# Patient Record
Sex: Female | Born: 1970
Health system: Southern US, Community
[De-identification: ages and names within clinical notes are randomized; demographics above are authoritative.]

## PROBLEM LIST (undated history)

## (undated) DIAGNOSIS — R7303 Prediabetes: Secondary | ICD-10-CM

## (undated) DIAGNOSIS — C539 Malignant neoplasm of cervix uteri, unspecified: Secondary | ICD-10-CM

## (undated) DIAGNOSIS — E739 Lactose intolerance, unspecified: Secondary | ICD-10-CM

## (undated) DIAGNOSIS — E7800 Pure hypercholesterolemia, unspecified: Secondary | ICD-10-CM

## (undated) DIAGNOSIS — M549 Dorsalgia, unspecified: Secondary | ICD-10-CM

## (undated) DIAGNOSIS — R87629 Unspecified abnormal cytological findings in specimens from vagina: Secondary | ICD-10-CM

## (undated) DIAGNOSIS — F32A Depression, unspecified: Secondary | ICD-10-CM

## (undated) DIAGNOSIS — F329 Major depressive disorder, single episode, unspecified: Secondary | ICD-10-CM

## (undated) DIAGNOSIS — I10 Essential (primary) hypertension: Secondary | ICD-10-CM

## (undated) DIAGNOSIS — IMO0002 Reserved for concepts with insufficient information to code with codable children: Secondary | ICD-10-CM

## (undated) DIAGNOSIS — M7730 Calcaneal spur, unspecified foot: Secondary | ICD-10-CM

## (undated) DIAGNOSIS — D649 Anemia, unspecified: Secondary | ICD-10-CM

## (undated) DIAGNOSIS — F419 Anxiety disorder, unspecified: Secondary | ICD-10-CM

## (undated) DIAGNOSIS — M2550 Pain in unspecified joint: Secondary | ICD-10-CM

## (undated) DIAGNOSIS — Z800 Family history of malignant neoplasm of digestive organs: Secondary | ICD-10-CM

## (undated) DIAGNOSIS — E669 Obesity, unspecified: Secondary | ICD-10-CM

## (undated) DIAGNOSIS — J45909 Unspecified asthma, uncomplicated: Secondary | ICD-10-CM

## (undated) DIAGNOSIS — G4730 Sleep apnea, unspecified: Secondary | ICD-10-CM

## (undated) DIAGNOSIS — M7710 Lateral epicondylitis, unspecified elbow: Secondary | ICD-10-CM

## (undated) HISTORY — DX: Reserved for concepts with insufficient information to code with codable children: IMO0002

## (undated) HISTORY — DX: Anemia, unspecified: D64.9

## (undated) HISTORY — PX: HERNIA REPAIR: SHX51

## (undated) HISTORY — DX: Major depressive disorder, single episode, unspecified: F32.9

## (undated) HISTORY — DX: Malignant neoplasm of cervix uteri, unspecified: C53.9

## (undated) HISTORY — PX: WISDOM TOOTH EXTRACTION: SHX21

## (undated) HISTORY — DX: Pain in unspecified joint: M25.50

## (undated) HISTORY — DX: Family history of malignant neoplasm of digestive organs: Z80.0

## (undated) HISTORY — DX: Pure hypercholesterolemia, unspecified: E78.00

## (undated) HISTORY — DX: Calcaneal spur, unspecified foot: M77.30

## (undated) HISTORY — PX: COLPOSCOPY: SHX161

## (undated) HISTORY — DX: Dorsalgia, unspecified: M54.9

## (undated) HISTORY — DX: Lateral epicondylitis, unspecified elbow: M77.10

## (undated) HISTORY — PX: OTHER SURGICAL HISTORY: SHX169

## (undated) HISTORY — DX: Unspecified abnormal cytological findings in specimens from vagina: R87.629

## (undated) HISTORY — DX: Obesity, unspecified: E66.9

## (undated) HISTORY — PX: TONSILLECTOMY AND ADENOIDECTOMY: SHX28

## (undated) HISTORY — DX: Depression, unspecified: F32.A

## (undated) HISTORY — DX: Sleep apnea, unspecified: G47.30

## (undated) HISTORY — DX: Lactose intolerance, unspecified: E73.9

## (undated) HISTORY — PX: TONSILLECTOMY: SUR1361

---

## 2002-03-28 ENCOUNTER — Encounter: Payer: Self-pay | Admitting: Family Medicine

## 2002-03-28 ENCOUNTER — Encounter: Admission: RE | Admit: 2002-03-28 | Discharge: 2002-03-28 | Payer: Self-pay | Admitting: Family Medicine

## 2003-05-23 ENCOUNTER — Inpatient Hospital Stay (HOSPITAL_COMMUNITY): Admission: AD | Admit: 2003-05-23 | Discharge: 2003-05-23 | Payer: Self-pay | Admitting: Obstetrics and Gynecology

## 2003-06-04 ENCOUNTER — Encounter: Admission: RE | Admit: 2003-06-04 | Discharge: 2003-06-04 | Payer: Self-pay | Admitting: Family Medicine

## 2003-06-04 ENCOUNTER — Encounter: Payer: Self-pay | Admitting: Family Medicine

## 2003-08-26 ENCOUNTER — Ambulatory Visit (HOSPITAL_BASED_OUTPATIENT_CLINIC_OR_DEPARTMENT_OTHER): Admission: RE | Admit: 2003-08-26 | Discharge: 2003-08-26 | Payer: Self-pay | Admitting: Otolaryngology

## 2003-10-06 ENCOUNTER — Ambulatory Visit (HOSPITAL_COMMUNITY): Admission: RE | Admit: 2003-10-06 | Discharge: 2003-10-07 | Payer: Self-pay | Admitting: Otolaryngology

## 2009-04-14 DIAGNOSIS — IMO0002 Reserved for concepts with insufficient information to code with codable children: Secondary | ICD-10-CM

## 2009-04-14 HISTORY — DX: Reserved for concepts with insufficient information to code with codable children: IMO0002

## 2010-12-03 ENCOUNTER — Encounter: Payer: Self-pay | Admitting: Obstetrics & Gynecology

## 2011-03-28 ENCOUNTER — Inpatient Hospital Stay (INDEPENDENT_AMBULATORY_CARE_PROVIDER_SITE_OTHER)
Admission: RE | Admit: 2011-03-28 | Discharge: 2011-03-28 | Disposition: A | Discharge status: Home / Self Care | Payer: Self-pay | Source: Ambulatory Visit | Attending: Family Medicine | Admitting: Family Medicine

## 2011-03-28 DIAGNOSIS — R059 Cough, unspecified: Secondary | ICD-10-CM

## 2011-03-28 DIAGNOSIS — J019 Acute sinusitis, unspecified: Secondary | ICD-10-CM

## 2011-03-28 DIAGNOSIS — R05 Cough: Secondary | ICD-10-CM

## 2011-08-06 ENCOUNTER — Emergency Department (HOSPITAL_COMMUNITY): Payer: Self-pay

## 2011-08-06 ENCOUNTER — Emergency Department (HOSPITAL_COMMUNITY)
Admission: EM | Admit: 2011-08-06 | Discharge: 2011-08-06 | Disposition: A | Discharge status: Home / Self Care | Payer: Self-pay | Attending: Emergency Medicine | Admitting: Emergency Medicine

## 2011-08-06 DIAGNOSIS — R51 Headache: Secondary | ICD-10-CM | POA: Insufficient documentation

## 2011-08-06 DIAGNOSIS — I10 Essential (primary) hypertension: Secondary | ICD-10-CM | POA: Insufficient documentation

## 2011-08-06 DIAGNOSIS — R42 Dizziness and giddiness: Secondary | ICD-10-CM | POA: Insufficient documentation

## 2011-08-06 DIAGNOSIS — R110 Nausea: Secondary | ICD-10-CM | POA: Insufficient documentation

## 2011-08-06 LAB — GLUCOSE, CAPILLARY: Glucose-Capillary: 127 mg/dL — ABNORMAL HIGH (ref 70–99)

## 2011-08-06 LAB — URINALYSIS, ROUTINE W REFLEX MICROSCOPIC
Bilirubin Urine: NEGATIVE
Ketones, ur: NEGATIVE mg/dL
Leukocytes, UA: NEGATIVE
Nitrite: NEGATIVE
Protein, ur: NEGATIVE mg/dL

## 2011-08-06 LAB — POCT I-STAT, CHEM 8
Calcium, Ion: 1.22 mmol/L (ref 1.12–1.32)
Glucose, Bld: 128 mg/dL — ABNORMAL HIGH (ref 70–99)
HCT: 41 % (ref 36.0–46.0)
TCO2: 25 mmol/L (ref 0–100)

## 2011-08-09 ENCOUNTER — Emergency Department (HOSPITAL_COMMUNITY)
Admission: EM | Admit: 2011-08-09 | Discharge: 2011-08-09 | Disposition: A | Discharge status: Home / Self Care | Payer: Self-pay | Attending: Emergency Medicine | Admitting: Emergency Medicine

## 2011-08-09 ENCOUNTER — Emergency Department (HOSPITAL_COMMUNITY): Payer: Self-pay

## 2011-08-09 DIAGNOSIS — R110 Nausea: Secondary | ICD-10-CM | POA: Insufficient documentation

## 2011-08-09 DIAGNOSIS — I10 Essential (primary) hypertension: Secondary | ICD-10-CM | POA: Insufficient documentation

## 2011-08-09 DIAGNOSIS — R51 Headache: Secondary | ICD-10-CM | POA: Insufficient documentation

## 2011-08-09 DIAGNOSIS — R42 Dizziness and giddiness: Secondary | ICD-10-CM | POA: Insufficient documentation

## 2011-08-09 DIAGNOSIS — H53149 Visual discomfort, unspecified: Secondary | ICD-10-CM | POA: Insufficient documentation

## 2011-08-09 LAB — URINALYSIS, ROUTINE W REFLEX MICROSCOPIC
Glucose, UA: NEGATIVE mg/dL
Hgb urine dipstick: NEGATIVE
Ketones, ur: NEGATIVE mg/dL
Protein, ur: NEGATIVE mg/dL
pH: 5.5 (ref 5.0–8.0)

## 2011-08-09 LAB — POCT I-STAT, CHEM 8
BUN: 16 mg/dL (ref 6–23)
Chloride: 100 mEq/L (ref 96–112)
Creatinine, Ser: 1.1 mg/dL (ref 0.50–1.10)
Sodium: 137 mEq/L (ref 135–145)
TCO2: 26 mmol/L (ref 0–100)

## 2011-08-09 LAB — DIFFERENTIAL
Basophils Relative: 1 % (ref 0–1)
Lymphocytes Relative: 43 % (ref 12–46)
Lymphs Abs: 3.2 10*3/uL (ref 0.7–4.0)
Monocytes Absolute: 0.7 10*3/uL (ref 0.1–1.0)
Monocytes Relative: 9 % (ref 3–12)
Neutro Abs: 3.4 10*3/uL (ref 1.7–7.7)
Neutrophils Relative %: 46 % (ref 43–77)

## 2011-08-09 LAB — CBC
HCT: 37.1 % (ref 36.0–46.0)
Hemoglobin: 11.6 g/dL — ABNORMAL LOW (ref 12.0–15.0)
MCH: 26.4 pg (ref 26.0–34.0)
MCHC: 31.3 g/dL (ref 30.0–36.0)
MCV: 84.5 fL (ref 78.0–100.0)
RBC: 4.39 MIL/uL (ref 3.87–5.11)

## 2012-09-03 ENCOUNTER — Ambulatory Visit (INDEPENDENT_AMBULATORY_CARE_PROVIDER_SITE_OTHER): Payer: BC Managed Care – PPO | Admitting: Obstetrics and Gynecology

## 2012-09-03 ENCOUNTER — Encounter: Payer: Self-pay | Admitting: Obstetrics and Gynecology

## 2012-09-03 VITALS — BP 108/70 | HR 74 | Ht 62.0 in | Wt 240.0 lb

## 2012-09-03 DIAGNOSIS — Z124 Encounter for screening for malignant neoplasm of cervix: Secondary | ICD-10-CM

## 2012-09-03 DIAGNOSIS — IMO0001 Reserved for inherently not codable concepts without codable children: Secondary | ICD-10-CM

## 2012-09-03 DIAGNOSIS — R6889 Other general symptoms and signs: Secondary | ICD-10-CM

## 2012-09-03 DIAGNOSIS — N920 Excessive and frequent menstruation with regular cycle: Secondary | ICD-10-CM

## 2012-09-03 LAB — CBC WITH DIFFERENTIAL/PLATELET
Basophils Relative: 0 % (ref 0–1)
HCT: 33.8 % — ABNORMAL LOW (ref 36.0–46.0)
Hemoglobin: 11 g/dL — ABNORMAL LOW (ref 12.0–15.0)
Lymphocytes Relative: 36 % (ref 12–46)
MCHC: 32.5 g/dL (ref 30.0–36.0)
Monocytes Absolute: 0.6 10*3/uL (ref 0.1–1.0)
Monocytes Relative: 8 % (ref 3–12)
Neutro Abs: 4.2 10*3/uL (ref 1.7–7.7)
Neutrophils Relative %: 54 % (ref 43–77)
RBC: 4.04 MIL/uL (ref 3.87–5.11)
WBC: 7.9 10*3/uL (ref 4.0–10.5)

## 2012-09-03 LAB — PROLACTIN: Prolactin: 6.3 ng/mL

## 2012-09-03 LAB — TSH: TSH: 0.624 u[IU]/mL (ref 0.350–4.500)

## 2012-09-03 NOTE — Patient Instructions (Signed)
Contraception Choices  Contraception (birth control) is the use of any methods or devices to prevent pregnancy. Below are some methods to help avoid pregnancy.  HORMONAL METHODS   · Contraceptive implant. This is a thin, plastic tube containing progesterone hormone. It does not contain estrogen hormone. Your caregiver inserts the tube in the inner part of the upper arm. The tube can remain in place for up to 3 years. After 3 years, the implant must be removed. The implant prevents the ovaries from releasing an egg (ovulation), thickens the cervical mucus which prevents sperm from entering the uterus, and thins the lining of the inside of the uterus.  · Progesterone-only injections. These injections are given every 3 months by your caregiver to prevent pregnancy. This synthetic progesterone hormone stops the ovaries from releasing eggs. It also thickens cervical mucus and changes the uterine lining. This makes it harder for sperm to survive in the uterus.  · Birth control pills. These pills contain estrogen and progesterone hormone. They work by stopping the egg from forming in the ovary (ovulation). Birth control pills are prescribed by a caregiver. Birth control pills can also be used to treat heavy periods.  · Minipill. This type of birth control pill contains only the progesterone hormone. They are taken every day of each month and must be prescribed by your caregiver.  · Birth control patch. The patch contains hormones similar to those in birth control pills. It must be changed once a week and is prescribed by a caregiver.  · Vaginal ring. The ring contains hormones similar to those in birth control pills. It is left in the vagina for 3 weeks, removed for 1 week, and then a new one is put back in place. The patient must be comfortable inserting and removing the ring from the vagina. A caregiver's prescription is necessary.  · Emergency contraception. Emergency contraceptives prevent pregnancy after unprotected  sexual intercourse. This pill can be taken right after sex or up to 5 days after unprotected sex. It is most effective the sooner you take the pills after having sexual intercourse. Emergency contraceptive pills are available without a prescription. Check with your pharmacist. Do not use emergency contraception as your only form of birth control.  BARRIER METHODS   · Female condom. This is a thin sheath (latex or rubber) that is worn over the penis during sexual intercourse. It can be used with spermicide to increase effectiveness.  · Female condom. This is a soft, loose-fitting sheath that is put into the vagina before sexual intercourse.  · Diaphragm. This is a soft, latex, dome-shaped barrier that must be fitted by a caregiver. It is inserted into the vagina, along with a spermicidal jelly. It is inserted before intercourse. The diaphragm should be left in the vagina for 6 to 8 hours after intercourse.  · Cervical cap. This is a round, soft, latex or plastic cup that fits over the cervix and must be fitted by a caregiver. The cap can be left in place for up to 48 hours after intercourse.  · Sponge. This is a soft, circular piece of polyurethane foam. The sponge has spermicide in it. It is inserted into the vagina after wetting it and before sexual intercourse.  · Spermicides. These are chemicals that kill or block sperm from entering the cervix and uterus. They come in the form of creams, jellies, suppositories, foam, or tablets. They do not require a prescription. They are inserted into the vagina with an applicator before having sexual intercourse.   The process must be repeated every time you have sexual intercourse.  INTRAUTERINE CONTRACEPTION  · Intrauterine device (IUD). This is a T-shaped device that is put in a woman's uterus during a menstrual period to prevent pregnancy. There are 2 types:  · Copper IUD. This type of IUD is wrapped in copper wire and is placed inside the uterus. Copper makes the uterus and  fallopian tubes produce a fluid that kills sperm. It can stay in place for 10 years.  · Hormone IUD. This type of IUD contains the hormone progestin (synthetic progesterone). The hormone thickens the cervical mucus and prevents sperm from entering the uterus, and it also thins the uterine lining to prevent implantation of a fertilized egg. The hormone can weaken or kill the sperm that get into the uterus. It can stay in place for 5 years.  PERMANENT METHODS OF CONTRACEPTION  · Female tubal ligation. This is when the woman's fallopian tubes are surgically sealed, tied, or blocked to prevent the egg from traveling to the uterus.  · Female sterilization. This is when the female has the tubes that carry sperm tied off (vasectomy). This blocks sperm from entering the vagina during sexual intercourse. After the procedure, the man can still ejaculate fluid (semen).  NATURAL PLANNING METHODS  · Natural family planning. This is not having sexual intercourse or using a barrier method (condom, diaphragm, cervical cap) on days the woman could become pregnant.  · Calendar method. This is keeping track of the length of each menstrual cycle and identifying when you are fertile.  · Ovulation method. This is avoiding sexual intercourse during ovulation.  · Symptothermal method. This is avoiding sexual intercourse during ovulation, using a thermometer and ovulation symptoms.  · Post-ovulation method. This is timing sexual intercourse after you have ovulated.  Regardless of which type or method of contraception you choose, it is important that you use condoms to protect against the transmission of sexually transmitted diseases (STDs). Talk with your caregiver about which form of contraception is most appropriate for you.  Document Released: 10/30/2005 Document Revised: 01/22/2012 Document Reviewed: 03/08/2011  [employer]® Patient Information ©2013 [employer].

## 2012-09-03 NOTE — Progress Notes (Signed)
Last Pap: 04/14/09 WNL: No ASC-H Regular Periods:no, sometimes Contraception: condoms  Monthly Breast exam:no Tetanus<67yrs:yes Nl.Bladder Function:yes Daily BMs:yes Healthy Diet:yes Calcium:no Mammogram:yes Date of Mammogram: 2009 Exercise:yes Have often Exercise: twice per week  Seatbelt: yes Abuse at home: no Stressful work:yes Sigmoid-colonoscopy: about 3 years ago wnl per pt Bone Density: No PCP: [facility] Change in PMH: n/a Change in FMH:n/a BP 108/70  Pulse 74  Ht 5\' 2"  (1.575 m)  Wt 240 lb (108.863 kg)  BMI 43.90 kg/m2  LMP 08/24/2012 Pt with complaints:yes and 1.  Pt c/o heavy vaginal bleeding for one yr.  Her periods are q month for 5-7 days and she changes a pad q 45 minutes.  She denies any bleeding d/o. She does have a h/o of fibroids.  She no longer desires children Physical Examination: General appearance - alert, well appearing, and in no distress Mental status - normal mood, behavior, speech, dress, motor activity, and thought processes Neck - supple, no significant adenopathy,  thyroid exam: thyroid is normal in size without nodules or tenderness Chest - clear to auscultation, no wheezes, rales or rhonchi, symmetric air   Heart - normal rate and regular rhythm Abdomen - soft, nontender, nondistended, no masses or organomegaly Breasts - breasts appear normal, no suspicious masses, no skin or nipple changes or axillary nodes Pelvic - normal external genitalia, vulva, vagina, cervix, uterus and adnexa Back exam - full range of motion, no tenderness, palpable spasm or pain on motion Neurological - alert, oriented, normal speech, no focal findings or movement disorder noted Musculoskeletal - no joint tenderness, deformity or swelling Extremities - no edema, redness or tenderness in the calves or thighs Skin - normal coloration and turgor, no rashes, no suspicious skin lesions noted mennorhagia ASC H First degree relative with colon  cancer Pap sent yes Mammogram due will scheudle condoms used for contraception.  Pt had an iud but it fell out RT for shg/embx/us  Check cbc. tsh and prolactin

## 2012-09-04 ENCOUNTER — Telehealth: Payer: Self-pay

## 2012-09-04 DIAGNOSIS — Z139 Encounter for screening, unspecified: Secondary | ICD-10-CM

## 2012-09-04 NOTE — Telephone Encounter (Signed)
Message copied by Rolla Plate on Wed Sep 04, 2012  8:41 AM ------      Message from: Jaymes Graff      Created: Tue Sep 03, 2012 10:07 AM       Please schedule pt for screening mammogram

## 2012-09-05 ENCOUNTER — Telehealth: Payer: Self-pay | Admitting: Obstetrics and Gynecology

## 2012-09-05 LAB — PAP IG W/ RFLX HPV ASCU

## 2012-09-06 LAB — HUMAN PAPILLOMAVIRUS, HIGH RISK: HPV DNA High Risk: NOT DETECTED

## 2012-09-06 NOTE — Telephone Encounter (Signed)
Barbara I'm sending this msg back, please call Niccole about this.

## 2012-09-17 ENCOUNTER — Telehealth: Payer: Self-pay

## 2012-09-17 NOTE — Telephone Encounter (Signed)
Lm on vm tcb rgd labs 

## 2012-09-17 NOTE — Telephone Encounter (Signed)
Message copied by Rolla Plate on Tue Sep 17, 2012  3:36 PM ------      Message from: Jaymes Graff      Created: Mon Sep 16, 2012  8:58 PM       Please tell patient her pap results and that she can repeat her pap in one year.  Thank you

## 2012-09-17 NOTE — Telephone Encounter (Signed)
Spoke with pt informed lab results pt voice understanding 

## 2012-10-07 ENCOUNTER — Ambulatory Visit
Admission: RE | Admit: 2012-10-07 | Discharge: 2012-10-07 | Disposition: A | Discharge status: Home / Self Care | Payer: BC Managed Care – PPO | Source: Ambulatory Visit | Attending: Obstetrics and Gynecology | Admitting: Obstetrics and Gynecology

## 2012-10-07 DIAGNOSIS — Z139 Encounter for screening, unspecified: Secondary | ICD-10-CM

## 2012-10-15 ENCOUNTER — Encounter: Payer: Self-pay | Admitting: Obstetrics and Gynecology

## 2012-10-25 ENCOUNTER — Encounter: Payer: BC Managed Care – HMO | Admitting: Obstetrics and Gynecology

## 2012-10-25 ENCOUNTER — Other Ambulatory Visit: Payer: Self-pay

## 2012-10-25 DIAGNOSIS — N920 Excessive and frequent menstruation with regular cycle: Secondary | ICD-10-CM

## 2012-11-21 ENCOUNTER — Other Ambulatory Visit: Payer: Self-pay | Admitting: Obstetrics and Gynecology

## 2012-11-21 DIAGNOSIS — N920 Excessive and frequent menstruation with regular cycle: Secondary | ICD-10-CM

## 2012-11-23 ENCOUNTER — Encounter (HOSPITAL_BASED_OUTPATIENT_CLINIC_OR_DEPARTMENT_OTHER): Payer: Self-pay | Admitting: *Deleted

## 2012-11-23 ENCOUNTER — Emergency Department (HOSPITAL_BASED_OUTPATIENT_CLINIC_OR_DEPARTMENT_OTHER)
Admission: EM | Admit: 2012-11-23 | Discharge: 2012-11-23 | Disposition: A | Discharge status: Home / Self Care | Payer: BC Managed Care – PPO | Attending: Emergency Medicine | Admitting: Emergency Medicine

## 2012-11-23 DIAGNOSIS — F3289 Other specified depressive episodes: Secondary | ICD-10-CM | POA: Insufficient documentation

## 2012-11-23 DIAGNOSIS — R0789 Other chest pain: Secondary | ICD-10-CM | POA: Insufficient documentation

## 2012-11-23 DIAGNOSIS — T7840XA Allergy, unspecified, initial encounter: Secondary | ICD-10-CM

## 2012-11-23 DIAGNOSIS — Z79899 Other long term (current) drug therapy: Secondary | ICD-10-CM | POA: Insufficient documentation

## 2012-11-23 DIAGNOSIS — L2989 Other pruritus: Secondary | ICD-10-CM | POA: Insufficient documentation

## 2012-11-23 DIAGNOSIS — F329 Major depressive disorder, single episode, unspecified: Secondary | ICD-10-CM | POA: Insufficient documentation

## 2012-11-23 DIAGNOSIS — L298 Other pruritus: Secondary | ICD-10-CM | POA: Insufficient documentation

## 2012-11-23 DIAGNOSIS — Z862 Personal history of diseases of the blood and blood-forming organs and certain disorders involving the immune mechanism: Secondary | ICD-10-CM | POA: Insufficient documentation

## 2012-11-23 DIAGNOSIS — R21 Rash and other nonspecific skin eruption: Secondary | ICD-10-CM | POA: Insufficient documentation

## 2012-11-23 MED ORDER — PREDNISONE 20 MG PO TABS: 40.0000 mg | ORAL_TABLET | Freq: Every day | ORAL | Status: DC

## 2012-11-23 MED ORDER — PREDNISONE 20 MG PO TABS
40.0000 mg | ORAL_TABLET | Freq: Once | ORAL | Status: AC
  Administered 2012-11-23: 40 mg via ORAL
  Filled 2012-11-23: qty 2

## 2012-11-23 MED ORDER — DIPHENHYDRAMINE HCL 25 MG PO CAPS
50.0000 mg | ORAL_CAPSULE | Freq: Once | ORAL | Status: AC
  Administered 2012-11-23: 50 mg via ORAL
  Filled 2012-11-23: qty 1

## 2012-11-23 NOTE — Discharge Instructions (Signed)

## 2012-11-23 NOTE — ED Notes (Addendum)
Pt states she woke up this a.m. And face was itching. Later this evening, noticed her face was swelling. Some chest discomfort "like indigestion". Denies other s/s. Similar reaction to Ace Inhibitors, but has been on current med for 3 years. Denies exposure or ingestion of new substances.
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
## 2012-11-23 NOTE — ED Notes (Signed)
Patient states that her face and lips began itching and swelling this morning after eating a donut at a meeting. Denies any rash anywhere else and denies swelling of her throat.

## 2012-11-25 ENCOUNTER — Other Ambulatory Visit: Payer: Self-pay | Admitting: Obstetrics and Gynecology

## 2012-11-25 ENCOUNTER — Encounter: Payer: BC Managed Care – HMO | Admitting: Obstetrics and Gynecology

## 2012-11-25 ENCOUNTER — Ambulatory Visit (INDEPENDENT_AMBULATORY_CARE_PROVIDER_SITE_OTHER): Payer: BC Managed Care – HMO

## 2012-11-25 ENCOUNTER — Ambulatory Visit (INDEPENDENT_AMBULATORY_CARE_PROVIDER_SITE_OTHER): Payer: BC Managed Care – HMO | Admitting: Obstetrics and Gynecology

## 2012-11-25 DIAGNOSIS — N920 Excessive and frequent menstruation with regular cycle: Secondary | ICD-10-CM

## 2012-11-25 DIAGNOSIS — L918 Other hypertrophic disorders of the skin: Secondary | ICD-10-CM

## 2012-11-25 DIAGNOSIS — N840 Polyp of corpus uteri: Secondary | ICD-10-CM | POA: Insufficient documentation

## 2012-11-25 DIAGNOSIS — L909 Atrophic disorder of skin, unspecified: Secondary | ICD-10-CM

## 2012-11-25 DIAGNOSIS — R6889 Other general symptoms and signs: Secondary | ICD-10-CM

## 2012-11-25 DIAGNOSIS — IMO0001 Reserved for inherently not codable concepts without codable children: Secondary | ICD-10-CM

## 2012-11-25 NOTE — Addendum Note (Signed)
Addended by: Rolla Plate on: 11/25/2012 04:49 PM   Modules accepted: Orders

## 2012-11-25 NOTE — Progress Notes (Signed)
Pt without c/o SHG/EMBX:  The patient was consented for both procedures.  She was placed in dorsal lithotomy position and speculum placed in the vagina.  The cervix was cleansed with three betadine swabs.  The endometrial pipet was placed in the the endometrial cavity through the cervix.  The uterus did sound to 9cm.  The pipet was removed and specimen was sent to pathology.  The sonohysterography catheter was then placed through the cervix and vaginal probe placed back in the vagina.Pt also with a skin tag just on the inside of left labium majorum.  Betadine applied.  1 cc 1% lidocaine infiltrated in skin.  Tag removed with the knife and sent to pathology.  silvernitrate used for hemostasis Menorrhagia SHG sig for polyp All treatments reviewed with the pt.  She desires polypectomy and endometrial ablation Pt desirs BTL discussed in detail' ASCUS neg HRHPV.  Repeat pap in one year

## 2012-11-25 NOTE — Patient Instructions (Signed)

## 2012-11-28 ENCOUNTER — Telehealth: Payer: Self-pay

## 2012-11-28 NOTE — Telephone Encounter (Signed)
Message copied by Rolla Plate on Thu Nov 28, 2012 11:58 AM ------      Message from: Jaymes Graff      Created: Wed Nov 27, 2012  5:19 PM       Please call the patient and let her know her endometrial biopsy is normal.  Tell her her vulvar biopsy was also normal.

## 2012-11-28 NOTE — Telephone Encounter (Signed)
Spoke with pt rgd labs informed endo biopsy and vulvar biopsy wnl pt voice understanding

## 2012-12-10 ENCOUNTER — Encounter (HOSPITAL_COMMUNITY): Payer: Self-pay | Admitting: Pharmacist

## 2012-12-10 ENCOUNTER — Other Ambulatory Visit: Payer: Self-pay | Admitting: Obstetrics and Gynecology

## 2012-12-10 ENCOUNTER — Telehealth: Payer: Self-pay | Admitting: Obstetrics and Gynecology

## 2012-12-10 NOTE — Telephone Encounter (Signed)
D&C Hysteroscopy; Endometrial Ablation and L/S BTL scheduled for 12/13/12 at 9:30 with ND.  BCBS effective 06/13/12.  Plan pays 90/10 after a $250 deductible.  Pre-op due $80.97. -Adrianne Pridgen

## 2012-12-12 ENCOUNTER — Encounter (HOSPITAL_COMMUNITY): Payer: Self-pay

## 2012-12-12 ENCOUNTER — Encounter (HOSPITAL_COMMUNITY)
Admission: RE | Admit: 2012-12-12 | Discharge: 2012-12-12 | Disposition: A | Discharge status: Home / Self Care | Payer: BC Managed Care – PPO | Source: Ambulatory Visit | Attending: Obstetrics and Gynecology | Admitting: Obstetrics and Gynecology

## 2012-12-12 HISTORY — DX: Essential (primary) hypertension: I10

## 2012-12-12 LAB — CBC
HCT: 35.1 % — ABNORMAL LOW (ref 36.0–46.0)
MCV: 87.5 fL (ref 78.0–100.0)
RBC: 4.01 MIL/uL (ref 3.87–5.11)
WBC: 8.3 10*3/uL (ref 4.0–10.5)

## 2012-12-12 LAB — BASIC METABOLIC PANEL
CO2: 32 mEq/L (ref 19–32)
Chloride: 100 mEq/L (ref 96–112)
Potassium: 3.5 mEq/L (ref 3.5–5.1)
Sodium: 142 mEq/L (ref 135–145)
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
## 2012-12-12 NOTE — Patient Instructions (Addendum)
   Your procedure is scheduled on: Friday, Jan 31  Enter through the Main Entrance of [facility] at: 8 am Pick up the phone at the desk and dial [phone] and inform us of your arrival.  Please call this number if you have any problems the morning of surgery: [phone]  Remember: Do not eat food after midnight: Thursday Do not drink clear liquids after: midnight Thursday Take these medicines the morning of surgery with a SIP OF WATER:  BP med  Do not wear jewelry, make-up, or FINGER nail polish No metal in your hair or on your body. Do not wear lotions, powders, perfumes. You may wear deodorant.  Please use your CHG wash as directed prior to surgery.  Do not shave anywhere for at least 12 hours prior to first CHG shower.  Do not bring valuables to the hospital. Contacts, dentures or bridgework may not be worn into surgery.  Patients discharged on the day of surgery will not be allowed to drive home.  Home with cousin Toribio Harbour.

## 2012-12-13 ENCOUNTER — Encounter (HOSPITAL_COMMUNITY)
Admission: RE | Disposition: A | Discharge status: Home / Self Care | Payer: Self-pay | Source: Ambulatory Visit | Attending: Obstetrics and Gynecology

## 2012-12-13 ENCOUNTER — Encounter (HOSPITAL_COMMUNITY): Payer: Self-pay | Admitting: *Deleted

## 2012-12-13 ENCOUNTER — Ambulatory Visit (HOSPITAL_COMMUNITY)
Admission: RE | Admit: 2012-12-13 | Discharge: 2012-12-13 | Disposition: A | Discharge status: Home / Self Care | Payer: BC Managed Care – PPO | Source: Ambulatory Visit | Attending: Obstetrics and Gynecology | Admitting: Obstetrics and Gynecology

## 2012-12-13 ENCOUNTER — Ambulatory Visit (HOSPITAL_COMMUNITY): Payer: BC Managed Care – PPO | Admitting: Anesthesiology

## 2012-12-13 ENCOUNTER — Encounter (HOSPITAL_COMMUNITY): Payer: Self-pay | Admitting: Anesthesiology

## 2012-12-13 DIAGNOSIS — Z01818 Encounter for other preprocedural examination: Secondary | ICD-10-CM | POA: Insufficient documentation

## 2012-12-13 DIAGNOSIS — Z01812 Encounter for preprocedural laboratory examination: Secondary | ICD-10-CM | POA: Insufficient documentation

## 2012-12-13 DIAGNOSIS — Z302 Encounter for sterilization: Secondary | ICD-10-CM | POA: Insufficient documentation

## 2012-12-13 DIAGNOSIS — N920 Excessive and frequent menstruation with regular cycle: Secondary | ICD-10-CM | POA: Insufficient documentation

## 2012-12-13 DIAGNOSIS — N840 Polyp of corpus uteri: Secondary | ICD-10-CM

## 2012-12-13 HISTORY — PX: LAPAROSCOPIC TUBAL LIGATION: SHX1937

## 2012-12-13 HISTORY — PX: DILITATION & CURRETTAGE/HYSTROSCOPY WITH THERMACHOICE ABLATION: SHX5569

## 2012-12-13 LAB — PREGNANCY, URINE: Preg Test, Ur: NEGATIVE

## 2012-12-13 SURGERY — LIGATION, FALLOPIAN TUBE, LAPAROSCOPIC
Anesthesia: General | Site: Uterus | Wound class: Clean Contaminated

## 2012-12-13 MED ORDER — LACTATED RINGERS IV SOLN
INTRAVENOUS | Status: DC
  Administered 2012-12-13: 50 mL/h via INTRAVENOUS
  Administered 2012-12-13: 10:00:00 via INTRAVENOUS
  Administered 2012-12-13: 50 mL/h via INTRAVENOUS

## 2012-12-13 MED ORDER — ONDANSETRON HCL 4 MG/2ML IJ SOLN
INTRAMUSCULAR | Status: AC
  Filled 2012-12-13: qty 2

## 2012-12-13 MED ORDER — KETOROLAC TROMETHAMINE 30 MG/ML IJ SOLN
30.0000 mg | Freq: Once | INTRAMUSCULAR | Status: AC
  Administered 2012-12-13: 30 mg via INTRAVENOUS

## 2012-12-13 MED ORDER — NEOSTIGMINE METHYLSULFATE 1 MG/ML IJ SOLN
INTRAMUSCULAR | Status: DC | PRN
  Administered 2012-12-13: 3 mg via INTRAVENOUS

## 2012-12-13 MED ORDER — FENTANYL CITRATE 0.05 MG/ML IJ SOLN
INTRAMUSCULAR | Status: AC
  Administered 2012-12-13: 50 ug via INTRAVENOUS
  Filled 2012-12-13: qty 2

## 2012-12-13 MED ORDER — FENTANYL CITRATE 0.05 MG/ML IJ SOLN
INTRAMUSCULAR | Status: AC
  Filled 2012-12-13: qty 5

## 2012-12-13 MED ORDER — MEPERIDINE HCL 25 MG/ML IJ SOLN: 6.2500 mg | INTRAMUSCULAR | Status: DC | PRN

## 2012-12-13 MED ORDER — SILVER NITRATE-POT NITRATE 75-25 % EX MISC
CUTANEOUS | Status: AC
  Filled 2012-12-13: qty 1

## 2012-12-13 MED ORDER — BUPIVACAINE HCL (PF) 0.25 % IJ SOLN
INTRAMUSCULAR | Status: DC | PRN
  Administered 2012-12-13: 19 mL

## 2012-12-13 MED ORDER — LIDOCAINE HCL (CARDIAC) 20 MG/ML IV SOLN
INTRAVENOUS | Status: DC | PRN
  Administered 2012-12-13: 10 mg via INTRAVENOUS
  Administered 2012-12-13: 80 mg via INTRAVENOUS

## 2012-12-13 MED ORDER — PROPOFOL 10 MG/ML IV EMUL
INTRAVENOUS | Status: AC
  Filled 2012-12-13: qty 20

## 2012-12-13 MED ORDER — ROCURONIUM BROMIDE 100 MG/10ML IV SOLN
INTRAVENOUS | Status: DC | PRN
  Administered 2012-12-13: 10 mg via INTRAVENOUS
  Administered 2012-12-13: 5 mg via INTRAVENOUS
  Administered 2012-12-13: 35 mg via INTRAVENOUS

## 2012-12-13 MED ORDER — EPHEDRINE 5 MG/ML INJ
10.0000 mg | Freq: Once | INTRAVENOUS | Status: DC
  Filled 2012-12-13: qty 2

## 2012-12-13 MED ORDER — GLYCOPYRROLATE 0.2 MG/ML IJ SOLN
INTRAMUSCULAR | Status: AC
  Filled 2012-12-13: qty 3

## 2012-12-13 MED ORDER — LIDOCAINE HCL 2 % IJ SOLN
INTRAMUSCULAR | Status: DC | PRN
  Administered 2012-12-13: 20 mL

## 2012-12-13 MED ORDER — BUPIVACAINE HCL (PF) 0.25 % IJ SOLN
INTRAMUSCULAR | Status: AC
  Filled 2012-12-13: qty 30

## 2012-12-13 MED ORDER — DEXAMETHASONE SODIUM PHOSPHATE 10 MG/ML IJ SOLN
INTRAMUSCULAR | Status: DC | PRN
  Administered 2012-12-13: 10 mg via INTRAVENOUS

## 2012-12-13 MED ORDER — EPHEDRINE SULFATE 50 MG/ML IJ SOLN
INTRAMUSCULAR | Status: AC
  Administered 2012-12-13: 10 mg
  Filled 2012-12-13: qty 1

## 2012-12-13 MED ORDER — IBUPROFEN 600 MG PO TABS: 600.0000 mg | ORAL_TABLET | Freq: Four times a day (QID) | ORAL | Status: DC | PRN

## 2012-12-13 MED ORDER — FENTANYL CITRATE 0.05 MG/ML IJ SOLN
INTRAMUSCULAR | Status: DC | PRN
  Administered 2012-12-13: 100 ug via INTRAVENOUS
  Administered 2012-12-13: 50 ug via INTRAVENOUS
  Administered 2012-12-13: 100 ug via INTRAVENOUS

## 2012-12-13 MED ORDER — LIDOCAINE HCL (CARDIAC) 20 MG/ML IV SOLN
INTRAVENOUS | Status: AC
  Filled 2012-12-13: qty 5

## 2012-12-13 MED ORDER — KETOROLAC TROMETHAMINE 30 MG/ML IJ SOLN
INTRAMUSCULAR | Status: AC
  Administered 2012-12-13: 30 mg via INTRAVENOUS
  Filled 2012-12-13: qty 1

## 2012-12-13 MED ORDER — FENTANYL CITRATE 0.05 MG/ML IJ SOLN
25.0000 ug | INTRAMUSCULAR | Status: DC | PRN
  Administered 2012-12-13 (×2): 50 ug via INTRAVENOUS

## 2012-12-13 MED ORDER — HYDROCODONE-ACETAMINOPHEN 5-500 MG PO TABS: 1.0000 | ORAL_TABLET | Freq: Four times a day (QID) | ORAL | Status: DC | PRN

## 2012-12-13 MED ORDER — ACETAMINOPHEN 10 MG/ML IV SOLN
1000.0000 mg | Freq: Four times a day (QID) | INTRAVENOUS | Status: DC
  Administered 2012-12-13: 1000 mg via INTRAVENOUS
  Filled 2012-12-13: qty 100

## 2012-12-13 MED ORDER — ACETAMINOPHEN 10 MG/ML IV SOLN
INTRAVENOUS | Status: AC
  Administered 2012-12-13: 1000 mg via INTRAVENOUS
  Filled 2012-12-13: qty 100

## 2012-12-13 MED ORDER — DEXAMETHASONE SODIUM PHOSPHATE 10 MG/ML IJ SOLN
INTRAMUSCULAR | Status: AC
  Filled 2012-12-13: qty 1

## 2012-12-13 MED ORDER — PHENYLEPHRINE HCL 10 MG/ML IJ SOLN
INTRAMUSCULAR | Status: DC | PRN
  Administered 2012-12-13: 40 ug via INTRAVENOUS

## 2012-12-13 MED ORDER — METOCLOPRAMIDE HCL 5 MG/ML IJ SOLN
INTRAMUSCULAR | Status: AC
  Administered 2012-12-13: 10 mg via INTRAVENOUS
  Filled 2012-12-13: qty 2

## 2012-12-13 MED ORDER — GLYCOPYRROLATE 0.2 MG/ML IJ SOLN
INTRAMUSCULAR | Status: DC | PRN
  Administered 2012-12-13: 0.1 mg via INTRAVENOUS
  Administered 2012-12-13: .6 mg via INTRAVENOUS

## 2012-12-13 MED ORDER — ROCURONIUM BROMIDE 50 MG/5ML IV SOLN
INTRAVENOUS | Status: AC
  Filled 2012-12-13: qty 1

## 2012-12-13 MED ORDER — MIDAZOLAM HCL 5 MG/5ML IJ SOLN
INTRAMUSCULAR | Status: DC | PRN
  Administered 2012-12-13: 2 mg via INTRAVENOUS

## 2012-12-13 MED ORDER — METOCLOPRAMIDE HCL 5 MG/ML IJ SOLN
10.0000 mg | Freq: Once | INTRAMUSCULAR | Status: AC | PRN
  Administered 2012-12-13: 10 mg via INTRAVENOUS

## 2012-12-13 MED ORDER — MIDAZOLAM HCL 2 MG/2ML IJ SOLN
INTRAMUSCULAR | Status: AC
  Filled 2012-12-13: qty 2

## 2012-12-13 MED ORDER — ONDANSETRON HCL 4 MG/2ML IJ SOLN
INTRAMUSCULAR | Status: DC | PRN
  Administered 2012-12-13: 4 mg via INTRAVENOUS

## 2012-12-13 MED ORDER — NEOSTIGMINE METHYLSULFATE 1 MG/ML IJ SOLN
INTRAMUSCULAR | Status: AC
  Filled 2012-12-13: qty 1

## 2012-12-13 SURGICAL SUPPLY — 25 items
ABLATOR ENDOMETRIAL BIPOLAR (ABLATOR) ×3 IMPLANT
CANISTER SUCTION 2500CC (MISCELLANEOUS) ×3 IMPLANT
CATH FOLEY 2WAY SLVR  5CC 16FR (CATHETERS) ×1
CATH FOLEY 2WAY SLVR 5CC 16FR (CATHETERS) ×2 IMPLANT
CATH ROBINSON RED A/P 16FR (CATHETERS) ×3 IMPLANT
CATH THERMACHOICE III (CATHETERS) IMPLANT
CLOTH BEACON ORANGE TIMEOUT ST (SAFETY) ×3 IMPLANT
CONTAINER PREFILL 10% NBF 60ML (FORM) ×6 IMPLANT
DRESSING TELFA 8X3 (GAUZE/BANDAGES/DRESSINGS) ×3 IMPLANT
FORCEPS CUTTING 33CM 5MM (CUTTING FORCEPS) ×3 IMPLANT
GLOVE BIO SURGEON STRL SZ 6.5 (GLOVE) ×6 IMPLANT
GLOVE BIOGEL PI IND STRL 7.0 (GLOVE) ×2 IMPLANT
GLOVE BIOGEL PI INDICATOR 7.0 (GLOVE) ×1
GOWN PREVENTION PLUS LG XLONG (DISPOSABLE) ×6 IMPLANT
GOWN STRL REIN XL XLG (GOWN DISPOSABLE) ×6 IMPLANT
PACK HYSTEROSCOPY LF (CUSTOM PROCEDURE TRAY) ×3 IMPLANT
PACK LAPAROSCOPY BASIN (CUSTOM PROCEDURE TRAY) ×3 IMPLANT
PAD OB MATERNITY 4.3X12.25 (PERSONAL CARE ITEMS) ×3 IMPLANT
SUT MNCRL AB 3-0 PS2 27 (SUTURE) ×3 IMPLANT
SUT VICRYL 0 UR6 27IN ABS (SUTURE) ×3 IMPLANT
SYR 20CC LL (SYRINGE) ×3 IMPLANT
TOWEL OR 17X24 6PK STRL BLUE (TOWEL DISPOSABLE) ×6 IMPLANT
TROCAR BALLN 12MMX100 BLUNT (TROCAR) ×3 IMPLANT
TROCAR Z-THREAD FIOS 5X100MM (TROCAR) ×6 IMPLANT
WATER STERILE IRR 1000ML POUR (IV SOLUTION) ×3 IMPLANT

## 2012-12-13 NOTE — Op Note (Signed)
dications: The patient is a39 y.o. female with diagnosis of multiparity and menorrhagia.  Pre-operative Diagnosis: Multiparity desires sterilization and menorrhagia  Post-operative Diagnosis: same  Surgeon: NWGNFAO,ZHYQM A   Assistants: none  Anesthesia: General endotracheal anesthesia  ASA Class: 1 Procedure  D&C hysteroscopy.  novasure endometrial ablation.  L/S Bilateral salpingectomy  Procedure Details The patient was seen in the Holding Room. The risks, benefits, complications, treatment options, and expected outcomes were discussed with the patient. The possibilities of reaction to medication, pulmonary aspiration, perforation of viscus, bleeding, recurrent infection, the need for additional procedures, failure to diagnose a condition, and creating a complication requiring transfusion or operation were discussed with the patient. The patient concurred with the proposed plan, giving informed consent. The patient was taken to the Operating Room, identified as Jordan Bush and the procedure verified as Diagnostic Laparoscopy with BTL and Novasure. A Time Out was held and the above information confirmed.   The patient was taken to the operating room and placed in dorsal lithotomy position. She was prepped and draped in the normal sterile fashion. A bivalve speculum was placed into the vagina. Before the bivalve speculum was placed in out catheter was used to drain the bladder. The anterior lip of the cervix was grasped with a single-tooth tenaculum.  20 cc 2% xylocaine was used for cervical block. The endocervix was measured and measured 4.5 cm the uterine sound length was 9 cm. The hysteroscope was placed into the uterine cavity. There was fluffy endometrium.  No masses or polyps were seen. The settings were placed on the NovaSure device.  The NovaSure was placed without difficulty and seated. Endometrial width was 3.5. Marland Kitchen  The NovaSure was tested and passed.  The NovaSure took place for 2 minute  20 seconds. The NovaSure was removed from the uterus. The hysteroscope was replaced into the uterine cavity. The fundus had been entirely ablated.  There were no signs of perforation.  The tenaculum was then anchored to the [employer].   Attention was then turned to the abdomen.      A 2 cm infra- umbilical incision was then performed.and carried down to the fascia.  The fascia was then opened and extended bilaterally.  Peritoneum was then entered.  o vicryl was then placed around the fascia in a circumferential fashion.   The hasson was placed and ancored to the suture.The above findings were noted. Normal pelvic anatomy.  Uterus,tubes, ovaries and appendix apperared normal.    The  Posterior and anterior culdesac and liver appeared normal.   After marcaine was used a 5mm incisions were made in the right and left lower quadrants of the abdomen.   five mm trocars were placed under direct visualization.  The gyrus was used to remove both tubes.  Hemostasis was noted.  A 5 mm scope was used in the LLQ 5 mm port.  The tubes were removed with a grasper through the umbilical port.  Irrigation was done.  Hemostasis was noted. All trocars were removed under direct visualization.   Following the procedure the umbilical hasson was removed after intra-abdominal carbon dioxide was expressed. The fascia was reaproximated by tying the 0 vicryl suture.   The incision was closed with subcutaneous and subcuticular sutures of 3-0 monocryl. The 5mm skin incisions were repaired with dermabond.   The intrauterine manipulator was then removed.  Foley was placed after the ablation and removed after the case was done Instrument, sponge, and needle counts were correct prior to abdominal closure  and at the conclusion of the case.   Findings: See above Estimated Blood Loss:  Minimal         Drains: none         Total IV Fluids:         Specimens: EMC and bilateral tuves         Complications:  None; patient  tolerated the procedure well. 50 ccdeficit of hysteroscpic fluid         Disposition: PACU - hemodynamically stable.         Condition: stable

## 2012-12-13 NOTE — Transfer of Care (Signed)
Immediate Anesthesia Transfer of Care Note  Patient: Jordan Bush  Procedure(s) Performed: Procedure(s) (LRB) with comments: LAPAROSCOPIC TUBAL LIGATION (Bilateral) DILATATION & CURETTAGE/HYSTEROSCOPY WITH THERMACHOICE ABLATION (N/A)  Patient Location: PACU  Anesthesia Type:General  Level of Consciousness: awake, alert  and oriented  Airway & Oxygen Therapy: Patient Spontanous Breathing and Patient connected to nasal cannula oxygen  Post-op Assessment: Report given to PACU RN and Post -op Vital signs reviewed and stable  Post vital signs: Reviewed and stable  Complications: No apparent anesthesia complications

## 2012-12-13 NOTE — Anesthesia Procedure Notes (Addendum)
Procedure Name: Intubation Date/Time: 12/13/2012 9:45 AM Performed by: Graciela Husbands Pre-anesthesia Checklist: Suction available, Emergency Drugs available, Timeout performed, Patient identified and Patient being monitored Patient Re-evaluated:Patient Re-evaluated prior to inductionOxygen Delivery Method: Circle system utilized Preoxygenation: Pre-oxygenation with 100% oxygen Intubation Type: IV induction Ventilation: Mask ventilation without difficulty Laryngoscope Size: Mac and 4 Grade View: Grade II Tube size: 7.0 mm Number of attempts: 1 Airway Equipment and Method: Patient positioned with wedge pillow and Stylet Placement Confirmation: breath sounds checked- equal and bilateral,  positive ETCO2 and ETT inserted through vocal cords under direct vision Secured at: 21 cm Dental Injury: Teeth and Oropharynx as per pre-operative assessment  Difficulty Due To: Difficulty was anticipated

## 2012-12-13 NOTE — Anesthesia Preprocedure Evaluation (Signed)
Anesthesia Evaluation  Patient identified by MRN, date of birth, ID band Patient awake    Reviewed: Allergy & Precautions, H&P , NPO status , Patient's Chart, lab work & pertinent test results  Airway Mallampati: III TM Distance: >3 FB Neck ROM: full    Dental No notable dental hx. (+) Teeth Intact   Pulmonary neg pulmonary ROS,  breath sounds clear to auscultation  Pulmonary exam normal       Cardiovascular hypertension, On Medications and On Home Beta Blockers Rhythm:regular Rate:Normal     Neuro/Psych negative neurological ROS     GI/Hepatic   Endo/Other  Morbid obesity  Renal/GU negative Renal ROS  negative genitourinary   Musculoskeletal   Abdominal Normal abdominal exam  (+)   Peds  Hematology negative hematology ROS (+) anemia ,   Anesthesia Other Findings   Reproductive/Obstetrics Endometrial Polyp Desires Permanent Sterilization                           Anesthesia Physical Anesthesia Plan  ASA: III  Anesthesia Plan: General ETT   Post-op Pain Management:    Induction:   Airway Management Planned:   Additional Equipment:   Intra-op Plan:   Post-operative Plan:   Informed Consent: I have reviewed the patients History and Physical, chart, labs and discussed the procedure including the risks, benefits and alternatives for the proposed anesthesia with the patient or authorized representative who has indicated his/her understanding and acceptance.   Dental Advisory Given  Plan Discussed with: Anesthesiologist, CRNA and Surgeon  Anesthesia Plan Comments:         Anesthesia Quick Evaluation

## 2012-12-13 NOTE — Anesthesia Postprocedure Evaluation (Signed)
Anesthesia Post Note  Patient: Jordan Bush  Procedure(s) Performed: Procedure(s) (LRB): LAPAROSCOPIC TUBAL LIGATION (Bilateral) DILATATION & CURETTAGE/HYSTEROSCOPY WITH THERMACHOICE ABLATION (N/A)  Anesthesia type: General  Patient location: PACU  Post pain: Pain level controlled  Post assessment: Post-op Vital signs reviewed  Last Vitals:  Filed Vitals:   12/13/12 1145  BP: 101/46  Pulse: 69  Temp:   Resp: 16    Post vital signs: Reviewed  Level of consciousness: sedated  Complications: No apparent anesthesia complications

## 2012-12-13 NOTE — Interval H&P Note (Signed)
History and Physical Interval Note:  12/13/2012 9:43 AM  Jordan Bush  has presented today for surgery, with the diagnosis of Menorrhagia, Endometrial Polyp, Multiparity [id number] [phone]  The various methods of treatment have been discussed with the patient and family. After consideration of risks, benefits and other options for treatment, the patient has consented to  Procedure(s) (LRB) with comments: LAPAROSCOPIC TUBAL LIGATION (Bilateral) DILATATION & CURETTAGE/HYSTEROSCOPY WITH THERMACHOICE ABLATION (N/A) as a surgical intervention .  The patient's history has been reviewed, patient examined, no change in status, stable for surgery.  I have reviewed the patient's chart and labs.  Questions were answered to the patient's satisfaction.     [id number],YNWGN A  Date of Initial H&P: 12/13/12  History reviewed, patient examined, no change in status, stable for surgery.

## 2012-12-16 ENCOUNTER — Telehealth: Payer: Self-pay | Admitting: Obstetrics and Gynecology

## 2012-12-16 ENCOUNTER — Encounter (HOSPITAL_COMMUNITY): Payer: Self-pay | Admitting: Obstetrics and Gynecology

## 2012-12-16 NOTE — Telephone Encounter (Signed)
Spoke with pr rgd msg pt had hysteroscopy tubal done on 12/13/12 wants to return to work some time this week wants a return to work note advised pt will consult with nd and see if she need to come in sooner for post op in order to go back to work pt voice understanding

## 2012-12-16 NOTE — Telephone Encounter (Signed)
Lm on vm tcb rgd msg 

## 2012-12-16 NOTE — Telephone Encounter (Signed)
Spoke with pt rgd msg pt wants to know when she can return to work advised pt need post-op first then we can give back to work note pt wil call back to [employer] op

## 2012-12-27 ENCOUNTER — Ambulatory Visit: Payer: BC Managed Care – HMO | Admitting: Obstetrics and Gynecology

## 2012-12-27 ENCOUNTER — Other Ambulatory Visit: Payer: Self-pay

## 2012-12-27 ENCOUNTER — Encounter: Payer: Self-pay | Admitting: Obstetrics and Gynecology

## 2012-12-27 ENCOUNTER — Telehealth: Payer: Self-pay | Admitting: Obstetrics and Gynecology

## 2012-12-27 VITALS — BP 110/72 | Temp 99.5°F | Wt 221.0 lb

## 2012-12-27 DIAGNOSIS — Z9851 Tubal ligation status: Secondary | ICD-10-CM

## 2012-12-27 MED ORDER — DOXYCYCLINE HYCLATE 50 MG PO CAPS: 100.0000 mg | ORAL_CAPSULE | Freq: Two times a day (BID) | ORAL | Status: DC

## 2012-12-27 NOTE — Progress Notes (Signed)
DATE OF SURGERY: 12/13/12 TYPE OF SURGERY: BTL, Hysteroscopy D&C  PAIN:Yes back pain and cramping VAG BLEEDING: yes spotting today  VAG DISCHARGE: yes with odor  NORMAL GI FUNCTN: yes NORMAL GU FUNCTN: yes Pt states she thinks she is having allergic reaction to something.  Diagnosis 1. Endometrium, curettage - BENIGN PROLIFERATIVE ENDOMETRIUM, NO ATYPIA, HYPERPLASIA OR MALIGNANCY. 2. Fallopian tube, bilateral - BENIGN FALLOPIAN TUBAL TISSUE WITH PARATUBAL CYST, NO EVIDENCE OF ENDOMETRIOSIS, ATYPIA OR MALIGNANCY. BP 110/72  Temp(Src) 99.5 F (37.5 C) (Oral)  Wt 221 lb (100.245 kg)  BMI 40.41 kg/m2 Physical Examination: General appearance - alert, well appearing, and in no distress Mouth - mucous membranes moist, pharynx normal without lesions and swelling in the upper right lip Abdomen - soft, nontender, nondistended, no masses or organomegaly Incisions CDI Pelvic - normal external genitalia, vulva, vagina, cervix, uterus and adnexa, mild uterine tenderness   S/p D&C hysteroscopy and endometrial ablalation & L/S Pt stable.  Mild uterine tenderness.  Will dive doxycycline Explained vaginal discharge after ablation normal. Use rephresh prn GFR slightly low f/u with PCP RT work on Monday Pt with allergic rxn.  Keep food diary.  No change in soaps or detergents.  Use benadryl and f/u with pcp

## 2012-12-27 NOTE — Telephone Encounter (Signed)
ND to put order for medication in.   Darien Ramus, CMA

## 2012-12-27 NOTE — Addendum Note (Signed)
Addended by: Jaymes Graff on: 12/27/2012 02:50 PM   Modules accepted: Orders

## 2012-12-27 NOTE — Telephone Encounter (Signed)
CP to speak with ND regarding med.

## 2012-12-28 ENCOUNTER — Other Ambulatory Visit: Payer: Self-pay

## 2013-09-12 ENCOUNTER — Other Ambulatory Visit: Payer: Self-pay

## 2013-09-12 DIAGNOSIS — Z1231 Encounter for screening mammogram for malignant neoplasm of breast: Secondary | ICD-10-CM

## 2013-10-07 ENCOUNTER — Encounter: Payer: Self-pay | Admitting: Emergency Medicine

## 2013-10-07 ENCOUNTER — Emergency Department (INDEPENDENT_AMBULATORY_CARE_PROVIDER_SITE_OTHER)
Admission: EM | Admit: 2013-10-07 | Discharge: 2013-10-07 | Disposition: A | Discharge status: Home / Self Care | Payer: BC Managed Care – PPO | Source: Home / Self Care | Attending: Emergency Medicine | Admitting: Emergency Medicine

## 2013-10-07 DIAGNOSIS — J45990 Exercise induced bronchospasm: Secondary | ICD-10-CM

## 2013-10-07 DIAGNOSIS — J0100 Acute maxillary sinusitis, unspecified: Secondary | ICD-10-CM

## 2013-10-07 DIAGNOSIS — J309 Allergic rhinitis, unspecified: Secondary | ICD-10-CM

## 2013-10-07 MED ORDER — PREDNISONE (PAK) 10 MG PO TABS: ORAL_TABLET | ORAL | Status: DC

## 2013-10-07 MED ORDER — FLUCONAZOLE 150 MG PO TABS: 150.0000 mg | ORAL_TABLET | Freq: Once | ORAL | Status: DC

## 2013-10-07 MED ORDER — ALBUTEROL SULFATE HFA 108 (90 BASE) MCG/ACT IN AERS: 2.0000 | INHALATION_SPRAY | RESPIRATORY_TRACT | Status: DC | PRN

## 2013-10-07 MED ORDER — FLUTICASONE PROPIONATE 50 MCG/ACT NA SUSP: NASAL | Status: DC

## 2013-10-07 MED ORDER — AZITHROMYCIN 250 MG PO TABS: ORAL_TABLET | ORAL | Status: DC
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
## 2013-10-07 NOTE — ED Notes (Signed)
Jordan Bush reports running a 5K 3 days ago. Since, she c/o runny nose, congestion and sneezing. Cough in the Am that causes SOB at times. She has had a flu vaccine this year.

## 2013-11-03 ENCOUNTER — Ambulatory Visit: Payer: BC Managed Care – PPO

## 2013-11-27 ENCOUNTER — Ambulatory Visit
Admission: RE | Admit: 2013-11-27 | Discharge: 2013-11-27 | Disposition: A | Discharge status: Home / Self Care | Payer: BC Managed Care – PPO | Source: Ambulatory Visit

## 2013-11-27 DIAGNOSIS — Z1231 Encounter for screening mammogram for malignant neoplasm of breast: Secondary | ICD-10-CM

## 2013-12-19 ENCOUNTER — Emergency Department (INDEPENDENT_AMBULATORY_CARE_PROVIDER_SITE_OTHER)
Admission: EM | Admit: 2013-12-19 | Discharge: 2013-12-19 | Disposition: A | Discharge status: Home / Self Care | Payer: BC Managed Care – PPO | Source: Home / Self Care | Attending: Family Medicine | Admitting: Family Medicine

## 2013-12-19 ENCOUNTER — Encounter: Payer: Self-pay | Admitting: Emergency Medicine

## 2013-12-19 DIAGNOSIS — J069 Acute upper respiratory infection, unspecified: Secondary | ICD-10-CM

## 2013-12-19 MED ORDER — PREDNISONE 20 MG PO TABS
20.0000 mg | ORAL_TABLET | Freq: Two times a day (BID) | ORAL | Status: DC
Start: 2013-12-19 — End: 2014-10-04

## 2013-12-19 MED ORDER — ALBUTEROL SULFATE HFA 108 (90 BASE) MCG/ACT IN AERS: 2.0000 | INHALATION_SPRAY | RESPIRATORY_TRACT | Status: DC | PRN

## 2013-12-19 MED ORDER — AZITHROMYCIN 250 MG PO TABS: ORAL_TABLET | ORAL | Status: DC

## 2013-12-19 MED ORDER — BENZONATATE 200 MG PO CAPS: 200.0000 mg | ORAL_CAPSULE | Freq: Every day | ORAL | Status: DC

## 2013-12-19 NOTE — Discharge Instructions (Signed)
Take plain Mucinex (1200 mg guaifenesin) twice daily for cough and congestion.  Increase fluid intake, rest. May use Afrin nasal spray (or generic oxymetazoline) twice daily for about 5 days.  Also recommend using saline nasal spray several times daily and saline nasal irrigation (AYR is a common brand) Try warm salt water gargles for sore throat.  Continue albuterol inhaler as needed. Stop all antihistamines for now, and other non-prescription cough/cold preparations. Begin Azithromycin if not improving about one week or if persistent fever develops  Follow-up with family doctor if not improving about10 days.

## 2013-12-19 NOTE — ED Notes (Signed)
Jordan Bush reports awaking today with productive cough, congestion and HA. No fever. Rec'd flu vac this season.
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
## 2014-04-07 ENCOUNTER — Emergency Department (INDEPENDENT_AMBULATORY_CARE_PROVIDER_SITE_OTHER)
Admission: EM | Admit: 2014-04-07 | Discharge: 2014-04-07 | Disposition: A | Discharge status: Home / Self Care | Payer: BC Managed Care – PPO | Source: Home / Self Care

## 2014-04-07 ENCOUNTER — Encounter: Payer: Self-pay | Admitting: Emergency Medicine

## 2014-04-07 DIAGNOSIS — J069 Acute upper respiratory infection, unspecified: Secondary | ICD-10-CM

## 2014-04-07 DIAGNOSIS — J029 Acute pharyngitis, unspecified: Secondary | ICD-10-CM

## 2014-04-07 LAB — POCT RAPID STREP A (OFFICE): RAPID STREP A SCREEN: NEGATIVE

## 2014-04-07 MED ORDER — FLUCONAZOLE 150 MG PO TABS: 150.0000 mg | ORAL_TABLET | Freq: Once | ORAL | Status: DC

## 2014-04-07 MED ORDER — AMOXICILLIN-POT CLAVULANATE 875-125 MG PO TABS: 1.0000 | ORAL_TABLET | Freq: Two times a day (BID) | ORAL | Status: DC

## 2014-04-07 MED ORDER — BENZONATATE 200 MG PO CAPS: 200.0000 mg | ORAL_CAPSULE | Freq: Three times a day (TID) | ORAL | Status: DC | PRN
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
## 2014-04-07 NOTE — Discharge Instructions (Signed)
I would try warm salt water gargles.  Mucinex D twice a day.  Ibuprofen for pain/inflammation.  Pharyngitis Pharyngitis is redness, pain, and swelling (inflammation) of your pharynx.  CAUSES  Pharyngitis is usually caused by infection. Most of the time, these infections are from viruses (viral) and are part of a cold. However, sometimes pharyngitis is caused by bacteria (bacterial). Pharyngitis can also be caused by allergies. Viral pharyngitis may be spread from person to person by coughing, sneezing, and personal items or utensils (cups, forks, spoons, toothbrushes). Bacterial pharyngitis may be spread from person to person by more intimate contact, such as kissing.  SIGNS AND SYMPTOMS  Symptoms of pharyngitis include:   Sore throat.   Tiredness (fatigue).   Low-grade fever.   Headache.  Joint pain and muscle aches.  Skin rashes.  Swollen lymph nodes.  Plaque-like film on throat or tonsils (often seen with bacterial pharyngitis). DIAGNOSIS  Your health care provider will ask you questions about your illness and your symptoms. Your medical history, along with a physical exam, is often all that is needed to diagnose pharyngitis. Sometimes, a rapid strep test is done. Other lab tests may also be done, depending on the suspected cause.  TREATMENT  Viral pharyngitis will usually get better in 3 4 days without the use of medicine. Bacterial pharyngitis is treated with medicines that kill germs (antibiotics).  HOME CARE INSTRUCTIONS   Drink enough water and fluids to keep your urine clear or pale yellow.   Only take over-the-counter or prescription medicines as directed by your health care provider:   If you are prescribed antibiotics, make sure you finish them even if you start to feel better.   Do not take aspirin.   Get lots of rest.   Gargle with 8 oz of salt water ( tsp of salt per 1 qt of water) as often as every 1 2 hours to soothe your throat.   Throat  lozenges (if you are not at risk for choking) or sprays may be used to soothe your throat. SEEK MEDICAL CARE IF:   You have large, tender lumps in your neck.  You have a rash.  You cough up green, yellow-brown, or bloody spit. SEEK IMMEDIATE MEDICAL CARE IF:   Your neck becomes stiff.  You drool or are unable to swallow liquids.  You vomit or are unable to keep medicines or liquids down.  You have severe pain that does not go away with the use of recommended medicines.  You have trouble breathing (not caused by a stuffy nose). MAKE SURE YOU:   Understand these instructions.  Will watch your condition.  Will get help right away if you are not doing well or get worse. Document Released: 10/30/2005 Document Revised: 08/20/2013 Document Reviewed: 07/07/2013 [facility] Patient Information 2014 [place].    Upper Respiratory Infection, Adult An upper respiratory infection (URI) is also known as the common cold. It is often caused by a type of germ (virus). Colds are easily spread (contagious). You can pass it to others by kissing, coughing, sneezing, or drinking out of the same glass. Usually, you get better in 1 or 2 weeks.  HOME CARE   Only take medicine as told by your doctor.  Use a warm mist humidifier or breathe in steam from a hot shower.  Drink enough water and fluids to keep your pee (urine) clear or pale yellow.  Get plenty of rest.  Return to work when your temperature is back to normal or  as told by your doctor. You may use a face mask and wash your hands to stop your cold from spreading. GET HELP RIGHT AWAY IF:   After the first few days, you feel you are getting worse.  You have questions about your medicine.  You have chills, shortness of breath, or brown or red spit (mucus).  You have yellow or brown snot (nasal discharge) or pain in the face, especially when you bend forward.  You have a fever, puffy (swollen) neck, pain when you swallow, or white  spots in the back of your throat.  You have a bad headache, ear pain, sinus pain, or chest pain.  You have a high-pitched whistling sound when you breathe in and out (wheezing).  You have a lasting cough or cough up blood.  You have sore muscles or a stiff neck. MAKE SURE YOU:   Understand these instructions.  Will watch your condition.  Will get help right away if you are not doing well or get worse. Document Released: 04/17/2008 Document Revised: 01/22/2012 Document Reviewed: 03/06/2011 [facility] Patient Information 2014 [place], [place].

## 2014-04-07 NOTE — ED Notes (Signed)
Pt c/o cough, SOB with talking, chest congestion, and sore throat x 3 days. Denies fever.

## 2014-04-08 NOTE — ED Provider Notes (Signed)
Agree with exam, assessment, and plan.   Kandra Nicolas, MD 04/08/14 [phone]

## 2014-07-13 ENCOUNTER — Emergency Department (HOSPITAL_BASED_OUTPATIENT_CLINIC_OR_DEPARTMENT_OTHER)
Admission: EM | Admit: 2014-07-13 | Discharge: 2014-07-13 | Disposition: A | Discharge status: Home / Self Care | Payer: BC Managed Care – PPO | Attending: Emergency Medicine | Admitting: Emergency Medicine

## 2014-07-13 ENCOUNTER — Encounter (HOSPITAL_BASED_OUTPATIENT_CLINIC_OR_DEPARTMENT_OTHER): Payer: Self-pay | Admitting: Emergency Medicine

## 2014-07-13 ENCOUNTER — Emergency Department (HOSPITAL_BASED_OUTPATIENT_CLINIC_OR_DEPARTMENT_OTHER): Payer: BC Managed Care – PPO

## 2014-07-13 DIAGNOSIS — Z3202 Encounter for pregnancy test, result negative: Secondary | ICD-10-CM | POA: Insufficient documentation

## 2014-07-13 DIAGNOSIS — R079 Chest pain, unspecified: Secondary | ICD-10-CM | POA: Insufficient documentation

## 2014-07-13 DIAGNOSIS — I10 Essential (primary) hypertension: Secondary | ICD-10-CM | POA: Insufficient documentation

## 2014-07-13 DIAGNOSIS — Z79899 Other long term (current) drug therapy: Secondary | ICD-10-CM | POA: Insufficient documentation

## 2014-07-13 DIAGNOSIS — F3289 Other specified depressive episodes: Secondary | ICD-10-CM | POA: Insufficient documentation

## 2014-07-13 DIAGNOSIS — F411 Generalized anxiety disorder: Secondary | ICD-10-CM | POA: Insufficient documentation

## 2014-07-13 DIAGNOSIS — Z792 Long term (current) use of antibiotics: Secondary | ICD-10-CM | POA: Diagnosis not present

## 2014-07-13 DIAGNOSIS — F329 Major depressive disorder, single episode, unspecified: Secondary | ICD-10-CM | POA: Insufficient documentation

## 2014-07-13 DIAGNOSIS — Z862 Personal history of diseases of the blood and blood-forming organs and certain disorders involving the immune mechanism: Secondary | ICD-10-CM | POA: Diagnosis not present

## 2014-07-13 DIAGNOSIS — F439 Reaction to severe stress, unspecified: Secondary | ICD-10-CM

## 2014-07-13 DIAGNOSIS — IMO0002 Reserved for concepts with insufficient information to code with codable children: Secondary | ICD-10-CM | POA: Insufficient documentation

## 2014-07-13 DIAGNOSIS — F430 Acute stress reaction: Secondary | ICD-10-CM | POA: Diagnosis not present

## 2014-07-13 DIAGNOSIS — F419 Anxiety disorder, unspecified: Secondary | ICD-10-CM

## 2014-07-13 DIAGNOSIS — R0789 Other chest pain: Secondary | ICD-10-CM | POA: Diagnosis not present

## 2014-07-13 LAB — CBC WITH DIFFERENTIAL/PLATELET
BASOS PCT: 0 % (ref 0–1)
Basophils Absolute: 0 10*3/uL (ref 0.0–0.1)
EOS ABS: 0.1 10*3/uL (ref 0.0–0.7)
Eosinophils Relative: 2 % (ref 0–5)
HCT: 38.9 % (ref 36.0–46.0)
HEMOGLOBIN: 12.6 g/dL (ref 12.0–15.0)
Lymphocytes Relative: 40 % (ref 12–46)
Lymphs Abs: 2.3 10*3/uL (ref 0.7–4.0)
MCH: 29.2 pg (ref 26.0–34.0)
MCHC: 32.4 g/dL (ref 30.0–36.0)
MCV: 90.3 fL (ref 78.0–100.0)
MONOS PCT: 8 % (ref 3–12)
Monocytes Absolute: 0.5 10*3/uL (ref 0.1–1.0)
NEUTROS ABS: 2.9 10*3/uL (ref 1.7–7.7)
NEUTROS PCT: 50 % (ref 43–77)
PLATELETS: 265 10*3/uL (ref 150–400)
RBC: 4.31 MIL/uL (ref 3.87–5.11)
RDW: 13.6 % (ref 11.5–15.5)
WBC: 5.9 10*3/uL (ref 4.0–10.5)

## 2014-07-13 LAB — BASIC METABOLIC PANEL
ANION GAP: 12 (ref 5–15)
BUN: 22 mg/dL (ref 6–23)
CHLORIDE: 99 meq/L (ref 96–112)
CO2: 29 mEq/L (ref 19–32)
Calcium: 9.9 mg/dL (ref 8.4–10.5)
Creatinine, Ser: 1.3 mg/dL — ABNORMAL HIGH (ref 0.50–1.10)
GFR, EST AFRICAN AMERICAN: 58 mL/min — AB (ref >90)
GFR, EST NON AFRICAN AMERICAN: 50 mL/min — AB (ref >90)
Glucose, Bld: 124 mg/dL — ABNORMAL HIGH (ref 70–99)
POTASSIUM: 4.3 meq/L (ref 3.7–5.3)
SODIUM: 140 meq/L (ref 137–147)

## 2014-07-13 LAB — TROPONIN I: Troponin I: <0.3 ng/mL (ref <0.30)

## 2014-07-13 LAB — PREGNANCY, URINE: Preg Test, Ur: NEGATIVE

## 2014-07-13 MED ORDER — ASPIRIN 325 MG PO TABS
325.0000 mg | ORAL_TABLET | Freq: Once | ORAL | Status: AC
  Administered 2014-07-13: 325 mg via ORAL
  Filled 2014-07-13: qty 1

## 2014-07-13 MED ORDER — NITROGLYCERIN 0.4 MG SL SUBL
0.4000 mg | SUBLINGUAL_TABLET | SUBLINGUAL | Status: DC | PRN
  Administered 2014-07-13: 0.4 mg via SUBLINGUAL
  Filled 2014-07-13: qty 1

## 2014-07-13 MED ORDER — LORAZEPAM 1 MG PO TABS
1.0000 mg | ORAL_TABLET | Freq: Once | ORAL | Status: AC
  Administered 2014-07-13: 1 mg via ORAL
  Filled 2014-07-13 (×2): qty 1

## 2014-07-13 NOTE — Discharge Instructions (Signed)
Chest Pain (Nonspecific) It is often hard to give a specific diagnosis for the cause of chest pain. There is always a chance that your pain could be related to something serious, such as a heart attack or a blood clot in the lungs. You need to follow up with your health care provider for further evaluation. CAUSES   Heartburn.  Pneumonia or bronchitis.  Anxiety or stress.  Inflammation around your heart (pericarditis) or lung (pleuritis or pleurisy).  A blood clot in the lung.  A collapsed lung (pneumothorax). It can develop suddenly on its own (spontaneous pneumothorax) or from trauma to the chest.  Shingles infection (herpes zoster virus). The chest wall is composed of bones, muscles, and cartilage. Any of these can be the source of the pain.  The bones can be bruised by injury.  The muscles or cartilage can be strained by coughing or overwork.  The cartilage can be affected by inflammation and become sore (costochondritis). DIAGNOSIS  Lab tests or other studies may be needed to find the cause of your pain. Your health care provider may have you take a test called an ambulatory electrocardiogram (ECG). An ECG records your heartbeat patterns over a 24-hour period. You may also have other tests, such as:  Transthoracic echocardiogram (TTE). During echocardiography, sound waves are used to evaluate how blood flows through your heart.  Transesophageal echocardiogram (TEE).  Cardiac monitoring. This allows your health care provider to monitor your heart rate and rhythm in real time.  Holter monitor. This is a portable device that records your heartbeat and can help diagnose heart arrhythmias. It allows your health care provider to track your heart activity for several days, if needed.  Stress tests by exercise or by giving medicine that makes the heart beat faster. TREATMENT   Treatment depends on what may be causing your chest pain. Treatment may include:  Acid blockers for  heartburn.  Anti-inflammatory medicine.  Pain medicine for inflammatory conditions.  Antibiotics if an infection is present.  You may be advised to change lifestyle habits. This includes stopping smoking and avoiding alcohol, caffeine, and chocolate.  You may be advised to keep your head raised (elevated) when sleeping. This reduces the chance of acid going backward from your stomach into your esophagus. Most of the time, nonspecific chest pain will improve within 2-3 days with rest and mild pain medicine.  HOME CARE INSTRUCTIONS   If antibiotics were prescribed, take them as directed. Finish them even if you start to feel better.  For the next few days, avoid physical activities that bring on chest pain. Continue physical activities as directed.  Do not use any tobacco products, including cigarettes, chewing tobacco, or electronic cigarettes.  Avoid drinking alcohol.  Only take medicine as directed by your health care provider.  Follow your health care provider's suggestions for further testing if your chest pain does not go away.  Keep any follow-up appointments you made. If you do not go to an appointment, you could develop lasting (chronic) problems with pain. If there is any problem keeping an appointment, call to reschedule. SEEK MEDICAL CARE IF:   Your chest pain does not go away, even after treatment.  You have a rash with blisters on your chest.  You have a fever. SEEK IMMEDIATE MEDICAL CARE IF:   You have increased chest pain or pain that spreads to your arm, neck, jaw, back, or abdomen.  You have shortness of breath.  You have an increasing cough, or you cough  up blood.  You have severe back or abdominal pain.  You feel nauseous or vomit.  You have severe weakness.  You faint.  You have chills. This is an emergency. Do not wait to see if the pain will go away. Get medical help at once. Call your local emergency services (911 in [place] Do not drive  yourself to the hospital. MAKE SURE YOU:   Understand these instructions.  Will watch your condition.  Will get help right away if you are not doing well or get worse. Document Released: 08/09/2005 Document Revised: 11/04/2013 Document Reviewed: 06/04/2008 [facility] Patient Information 2015 [place], [place]. This information is not intended to replace advice given to you by your health care provider. Make sure you discuss any questions you have with your health care provider.  Stress and Stress Management Stress is a normal reaction to life events. It is what you feel when life demands more than you are used to or more than you can handle. Some stress can be useful. For example, the stress reaction can help you catch the last bus of the day, study for a test, or meet a deadline at work. But stress that occurs too often or for too long can cause problems. It can affect your emotional health and interfere with relationships and normal daily activities. Too much stress can weaken your immune system and increase your risk for physical illness. If you already have a medical problem, stress can make it worse. CAUSES  All sorts of life events may cause stress. An event that causes stress for one person may not be stressful for another person. Major life events commonly cause stress. These may be positive or negative. Examples include losing your job, moving into a new home, getting married, having a baby, or losing a loved one. Less obvious life events may also cause stress, especially if they occur day after day or in combination. Examples include working long hours, driving in traffic, caring for children, being in debt, or being in a difficult relationship. SIGNS AND SYMPTOMS Stress may cause emotional symptoms including, the following:  Anxiety. This is feeling worried, afraid, on edge, overwhelmed, or out of control.  Anger. This is feeling irritated or impatient.  Depression. This is feeling sad,  down, helpless, or guilty.  Difficulty focusing, remembering, or making decisions. Stress may cause physical symptoms, including the following:   Aches and pains. These may affect your head, neck, back, stomach, or other areas of your body.  Tight muscles or clenched jaw.  Low energy or trouble sleeping. Stress may cause unhealthy behaviors, including the following:   Eating to feel better (overeating) or skipping meals.  Sleeping too little, too much, or both.  Working too much or putting off tasks (procrastination).  Smoking, drinking alcohol, or using drugs to feel better. DIAGNOSIS  Stress is diagnosed through an assessment by your health care provider. Your health care provider will ask questions about your symptoms and any stressful life events.Your health care provider will also ask about your medical history and may order blood tests or other tests. Certain medical conditions and medicine can cause physical symptoms similar to stress. Mental illness can cause emotional symptoms and unhealthy behaviors similar to stress. Your health care provider may refer you to a mental health professional for further evaluation.  TREATMENT  Stress management is the recommended treatment for stress.The goals of stress management are reducing stressful life events and coping with stress in healthy ways.  Techniques for reducing stressful  life events include the following:  Stress identification. Self-monitor for stress and identify what causes stress for you. These skills may help you to avoid some stressful events.  Time management. Set your priorities, keep a calendar of events, and learn to say "no." These tools can help you avoid making too many commitments. Techniques for coping with stress include the following:  Rethinking the problem. Try to think realistically about stressful events rather than ignoring them or overreacting. Try to find the positives in a stressful situation rather  than focusing on the negatives.  Exercise. Physical exercise can release both physical and emotional tension. The key is to find a form of exercise you enjoy and do it regularly.  Relaxation techniques. These relax the body and mind. Examples include yoga, meditation, tai chi, biofeedback, deep breathing, progressive muscle relaxation, listening to music, being out in nature, journaling, and other hobbies. Again, the key is to find one or more that you enjoy and can do regularly.  Healthy lifestyle. Eat a balanced diet, get plenty of sleep, and do not smoke. Avoid using alcohol or drugs to relax.  Strong support network. Spend time with family, friends, or other people you enjoy being around.Express your feelings and talk things over with someone you trust. Counseling or talktherapy with a mental health professional may be helpful if you are having difficulty managing stress on your own. Medicine is typically not recommended for the treatment of stress.Talk to your health care provider if you think you need medicine for symptoms of stress. HOME CARE INSTRUCTIONS  Keep all follow-up visits as directed by your health care provider.  Take all medicines as directed by your health care provider. SEEK MEDICAL CARE IF:  Your symptoms get worse or you start having new symptoms.  You feel overwhelmed by your problems and can no longer manage them on your own. SEEK IMMEDIATE MEDICAL CARE IF:  You feel like hurting yourself or someone else. Document Released: 04/25/2001 Document Revised: 03/16/2014 Document Reviewed: 06/24/2013 [facility] Patient Information 2015 [place], [place]. This information is not intended to replace advice given to you by your health care provider. Make sure you discuss any questions you have with your health care provider.
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
## 2014-07-13 NOTE — ED Provider Notes (Signed)
Medical screening examination/treatment/procedure(s) were performed by non-physician practitioner and as supervising physician I was immediately available for consultation/collaboration.   EKG Interpretation None        Wandra Arthurs, MD 07/13/14 [phone]

## 2014-07-13 NOTE — ED Notes (Signed)
C/o dizziness and lightheaded started this am. C/o dull aching pain in chest. Felt week took BP 100/70. Pain did not radiate. No N/v

## 2014-07-15 DIAGNOSIS — I10 Essential (primary) hypertension: Secondary | ICD-10-CM | POA: Insufficient documentation

## 2014-07-22 DIAGNOSIS — D649 Anemia, unspecified: Secondary | ICD-10-CM

## 2014-07-22 HISTORY — DX: Anemia, unspecified: D64.9

## 2014-09-14 ENCOUNTER — Encounter (HOSPITAL_BASED_OUTPATIENT_CLINIC_OR_DEPARTMENT_OTHER): Payer: Self-pay | Admitting: Emergency Medicine

## 2014-09-17 ENCOUNTER — Other Ambulatory Visit: Payer: Self-pay | Admitting: Obstetrics and Gynecology

## 2014-09-17 DIAGNOSIS — Z1231 Encounter for screening mammogram for malignant neoplasm of breast: Secondary | ICD-10-CM

## 2014-10-04 ENCOUNTER — Emergency Department (INDEPENDENT_AMBULATORY_CARE_PROVIDER_SITE_OTHER)
Admission: EM | Admit: 2014-10-04 | Discharge: 2014-10-04 | Disposition: A | Discharge status: Home / Self Care | Payer: BC Managed Care – PPO | Source: Home / Self Care | Attending: Family Medicine | Admitting: Family Medicine

## 2014-10-04 DIAGNOSIS — J069 Acute upper respiratory infection, unspecified: Secondary | ICD-10-CM

## 2014-10-04 DIAGNOSIS — B9789 Other viral agents as the cause of diseases classified elsewhere: Principal | ICD-10-CM

## 2014-10-04 MED ORDER — ALBUTEROL SULFATE HFA 108 (90 BASE) MCG/ACT IN AERS: 2.0000 | INHALATION_SPRAY | RESPIRATORY_TRACT | Status: DC | PRN

## 2014-10-04 MED ORDER — BENZONATATE 200 MG PO CAPS: 200.0000 mg | ORAL_CAPSULE | Freq: Every day | ORAL | Status: DC

## 2014-10-04 MED ORDER — AMOXICILLIN 875 MG PO TABS: 875.0000 mg | ORAL_TABLET | Freq: Two times a day (BID) | ORAL | Status: DC

## 2014-10-04 MED ORDER — PREDNISONE 20 MG PO TABS: 20.0000 mg | ORAL_TABLET | Freq: Two times a day (BID) | ORAL | Status: DC

## 2014-10-04 NOTE — Discharge Instructions (Signed)
Take plain Mucinex (1200 mg guaifenesin) twice daily for cough and congestion.  Increase fluid intake, rest. May use Afrin nasal spray (or generic oxymetazoline) twice daily for about 5 days.  Also recommend using saline nasal spray several times daily and saline nasal irrigation (AYR is a common brand).  Use Flonase nasal spray after Afrin and saline irrigation. Try warm salt water gargles for sore throat.  May resume albuterol inhaler if wheezing occurs. Stop all antihistamines for now, and other non-prescription cough/cold preparations. Begin Amoxicillin if not improving about 5 days or if persistent fever develops   Follow-up with family doctor if not improving 7 to 10 days.

## 2014-10-04 NOTE — ED Notes (Signed)
Patient c/o sinus pain and pressure, drainage and sore throat  x 3-4 days
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
## 2014-11-30 ENCOUNTER — Ambulatory Visit: Payer: BC Managed Care – PPO

## 2015-01-15 ENCOUNTER — Ambulatory Visit
Admission: RE | Admit: 2015-01-15 | Discharge: 2015-01-15 | Disposition: A | Discharge status: Home / Self Care | Payer: BLUE CROSS/BLUE SHIELD | Source: Ambulatory Visit | Attending: Obstetrics and Gynecology | Admitting: Obstetrics and Gynecology

## 2015-01-15 DIAGNOSIS — Z1231 Encounter for screening mammogram for malignant neoplasm of breast: Secondary | ICD-10-CM

## 2015-01-23 ENCOUNTER — Encounter: Payer: Self-pay | Admitting: Podiatry

## 2015-01-23 ENCOUNTER — Ambulatory Visit (INDEPENDENT_AMBULATORY_CARE_PROVIDER_SITE_OTHER): Payer: BLUE CROSS/BLUE SHIELD

## 2015-01-23 ENCOUNTER — Ambulatory Visit (INDEPENDENT_AMBULATORY_CARE_PROVIDER_SITE_OTHER): Payer: BLUE CROSS/BLUE SHIELD | Admitting: Podiatry

## 2015-01-23 VITALS — BP 116/71 | HR 71 | Resp 18

## 2015-01-23 DIAGNOSIS — R52 Pain, unspecified: Secondary | ICD-10-CM

## 2015-01-23 DIAGNOSIS — M722 Plantar fascial fibromatosis: Secondary | ICD-10-CM | POA: Diagnosis not present

## 2015-01-23 DIAGNOSIS — M79671 Pain in right foot: Secondary | ICD-10-CM | POA: Diagnosis not present

## 2015-01-23 DIAGNOSIS — M7661 Achilles tendinitis, right leg: Secondary | ICD-10-CM | POA: Diagnosis not present

## 2015-01-23 MED ORDER — MELOXICAM 7.5 MG PO TABS: 7.5000 mg | ORAL_TABLET | Freq: Every day | ORAL | Status: DC

## 2015-01-23 NOTE — Patient Instructions (Signed)
Achilles Tendinitis   with Rehab  Achilles tendinitis is a disorder of the Achilles tendon. The Achilles tendon connects the large calf muscles (Gastrocnemius and Soleus) to the heel bone (calcaneus). This tendon is sometimes called the heel cord. It is important for pushing-off and standing on your toes and is important for walking, running, or jumping. Tendinitis is often caused by overuse and repetitive microtrauma.  SYMPTOMS  · Pain, tenderness, swelling, warmth, and redness may occur over the Achilles tendon even at rest.  · Pain with pushing off, or flexing or extending the ankle.  · Pain that is worsened after or during activity.  CAUSES   · Overuse sometimes seen with rapid increase in exercise programs or in sports requiring running and jumping.  · Poor physical conditioning (strength and flexibility or endurance).  · Running sports, especially training running down hills.  · Inadequate warm-up before practice or play or failure to stretch before participation.  · Injury to the tendon.  PREVENTION   · Warm up and stretch before practice or competition.  · Allow time for adequate rest and recovery between practices and competition.  · Keep up conditioning.  ¨ Keep up ankle and leg flexibility.  ¨ Improve or keep muscle strength and endurance.  ¨ Improve cardiovascular fitness.  · Use proper technique.  · Use proper equipment (shoes, skates).  · To help prevent recurrence, taping, protective strapping, or an adhesive bandage may be recommended for several weeks after healing is complete.  PROGNOSIS   · Recovery may take weeks to several months to heal.  · Longer recovery is expected if symptoms have been prolonged.  · Recovery is usually quicker if the inflammation is due to a direct blow as compared with overuse or sudden strain.  RELATED COMPLICATIONS   · Healing time will be prolonged if the condition is not correctly treated. The injury must be given plenty of time to heal.  · Symptoms can reoccur if  activity is resumed too soon.  · Untreated, tendinitis may increase the risk of tendon rupture requiring additional time for recovery and possibly surgery.  TREATMENT   · The first treatment consists of rest anti-inflammatory medication, and ice to relieve the pain.  · Stretching and strengthening exercises after resolution of pain will likely help reduce the risk of recurrence. Referral to a physical therapist or athletic trainer for further evaluation and treatment may be helpful.  · A walking boot or cast may be recommended to rest the Achilles tendon. This can help break the cycle of inflammation and microtrauma.  · Arch supports (orthotics) may be prescribed or recommended by your caregiver as an adjunct to therapy and rest.  · Surgery to remove the inflamed tendon lining or degenerated tendon tissue is rarely necessary and has shown less than predictable results.  MEDICATION   · Nonsteroidal anti-inflammatory medications, such as aspirin and ibuprofen, may be used for pain and inflammation relief. Do not take within 7 days before surgery. Take these as directed by your caregiver. Contact your caregiver immediately if any bleeding, stomach upset, or signs of allergic reaction occur. Other minor pain relievers, such as acetaminophen, may also be used.  · Pain relievers may be prescribed as necessary by your caregiver. Do not take prescription pain medication for longer than 4 to 7 days. Use only as directed and only as much as you need.  · Cortisone injections are rarely indicated. Cortisone injections may weaken tendons and predispose to rupture. It is better   to give the condition more time to heal than to use them.  HEAT AND COLD  · Cold is used to relieve pain and reduce inflammation for acute and chronic Achilles tendinitis. Cold should be applied for 10 to 15 minutes every 2 to 3 hours for inflammation and pain and immediately after any activity that aggravates your symptoms. Use ice packs or an ice  massage.  · Heat may be used before performing stretching and strengthening activities prescribed by your caregiver. Use a heat pack or a warm soak.  SEEK MEDICAL CARE IF:  · Symptoms get worse or do not improve in 2 weeks despite treatment.  · New, unexplained symptoms develop. Drugs used in treatment may produce side effects.  EXERCISES  RANGE OF MOTION (ROM) AND STRETCHING EXERCISES - Achilles Tendinitis   These exercises may help you when beginning to rehabilitate your injury. Your symptoms may resolve with or without further involvement from your physician, physical therapist or athletic trainer. While completing these exercises, remember:   · Restoring tissue flexibility helps normal motion to return to the joints. This allows healthier, less painful movement and activity.  · An effective stretch should be held for at least 30 seconds.  · A stretch should never be painful. You should only feel a gentle lengthening or release in the stretched tissue.  STRETCH - Gastroc, Standing   · Place hands on wall.  · Extend right / left leg, keeping the front knee somewhat bent.  · Slightly point your toes inward on your back foot.  · Keeping your right / left heel on the floor and your knee straight, shift your weight toward the wall, not allowing your back to arch.  · You should feel a gentle stretch in the right / left calf. Hold this position for __________ seconds.  Repeat __________ times. Complete this stretch __________ times per day.  STRETCH - Soleus, Standing   · Place hands on wall.  · Extend right / left leg, keeping the other knee somewhat bent.  · Slightly point your toes inward on your back foot.  · Keep your right / left heel on the floor, bend your back knee, and slightly shift your weight over the back leg so that you feel a gentle stretch deep in your back calf.  · Hold this position for __________ seconds.  Repeat __________ times. Complete this stretch __________ times per day.  STRETCH -  Gastrocsoleus, Standing   Note: This exercise can place a lot of stress on your foot and ankle. Please complete this exercise only if specifically instructed by your caregiver.   · Place the ball of your right / left foot on a step, keeping your other foot firmly on the same step.  · Hold on to the wall or a rail for balance.  · Slowly lift your other foot, allowing your body weight to press your heel down over the edge of the step.  · You should feel a stretch in your right / left calf.  · Hold this position for __________ seconds.  · Repeat this exercise with a slight bend in your knee.  Repeat __________ times. Complete this stretch __________ times per day.   STRENGTHENING EXERCISES - Achilles Tendinitis  These exercises may help you when beginning to rehabilitate your injury. They may resolve your symptoms with or without further involvement from your physician, physical therapist or athletic trainer. While completing these exercises, remember:   · Muscles can gain both the endurance   and the strength needed for everyday activities through controlled exercises.  · Complete these exercises as instructed by your physician, physical therapist or athletic trainer. Progress the resistance and repetitions only as guided.  · You may experience muscle soreness or fatigue, but the pain or discomfort you are trying to eliminate should never worsen during these exercises. If this pain does worsen, stop and make certain you are following the directions exactly. If the pain is still present after adjustments, discontinue the exercise until you can discuss the trouble with your clinician.  STRENGTH - Plantar-flexors   · Sit with your right / left leg extended. Holding onto both ends of a rubber exercise band/tubing, loop it around the ball of your foot. Keep a slight tension in the band.  · Slowly push your toes away from you, pointing them downward.  · Hold this position for __________ seconds. Return slowly, controlling the  tension in the band/tubing.  Repeat __________ times. Complete this exercise __________ times per day.   STRENGTH - Plantar-flexors   · Stand with your feet shoulder width apart. Steady yourself with a wall or table using as little support as needed.  · Keeping your weight evenly spread over the width of your feet, rise up on your toes.*  · Hold this position for __________ seconds.  Repeat __________ times. Complete this exercise __________ times per day.   *If this is too easy, shift your weight toward your right / left leg until you feel challenged. Ultimately, you may be asked to do this exercise with your right / left foot only.  STRENGTH - Plantar-flexors, Eccentric   Note: This exercise can place a lot of stress on your foot and ankle. Please complete this exercise only if specifically instructed by your caregiver.   · Place the balls of your feet on a step. With your hands, use only enough support from a wall or rail to keep your balance.  · Keep your knees straight and rise up on your toes.  · Slowly shift your weight entirely to your right / left toes and pick up your opposite foot. Gently and with controlled movement, lower your weight through your right / left foot so that your heel drops below the level of the step. You will feel a slight stretch in the back of your calf at the end position.  · Use the healthy leg to help rise up onto the balls of both feet, then lower weight only on the right / left leg again. Build up to 15 repetitions. Then progress to 3 consecutive sets of 15 repetitions.*  · After completing the above exercise, complete the same exercise with a slight knee bend (about 30 degrees). Again, build up to 15 repetitions. Then progress to 3 consecutive sets of 15 repetitions.*  Perform this exercise __________ times per day.   *When you easily complete 3 sets of 15, your physician, physical therapist or athletic trainer may advise you to add resistance by wearing a backpack filled with  additional weight.  STRENGTH - Plantar Flexors, Seated   · Sit on a chair that allows your feet to rest flat on the ground. If necessary, sit at the edge of the chair.  · Keeping your toes firmly on the ground, lift your right / left heel as far as you can without increasing any discomfort in your ankle.  Repeat __________ times. Complete this exercise __________ times a day.  *If instructed by your physician, physical therapist or athletic   trainer, you may add ____________________ of resistance by placing a weighted object on your right / left knee.  Document Released: 05/31/2005 Document Revised: 01/22/2012 Document Reviewed: 02/11/2009  [employer]® Patient Information ©2015 [employer]. This information is not intended to replace advice given to you by your health care provider. Make sure you discuss any questions you have with your health care provider.

## 2015-01-23 NOTE — Progress Notes (Signed)
   Subjective:    Patient ID: Jordan Bush, female    DOB: 12/10/1970, 44 y.o.   MRN: [id number]  HPI  44 year old female presents the office today with complaints of right heel pain and arch pain. She states the arch. His been ongoing for approximately one year and the heel pain his been ongoing for approximately 3 weeks. She states that she's had arch pain to her right foot after she started running approximately year ago. At the time of onset of the heel pain she denies any history of injury or trauma. She denies any swelling or any redness overlying the area. She denies numbness or tingling. Pain does not wake her up at night. She denies any history of injury or trauma. She said no prior treatment. No other complaints at this time.   Review of Systems  All other systems reviewed and are negative.      Objective:   Physical Exam AAO 3, NAD DP/PT pulses palpable, CRT less than 3 seconds Protective sensation intact with Simms Weinstein monofilament, vibratory sensation intact, Achilles tendon reflex intact. There is a decrease in medial arch height upon weightbearing. There is tenderness overlying the posterior aspect the right calcaneus at the insertion of the Achilles tendon. There is no pain along the midsubstance Achilles tendon. There is no defects identified. Positive test was performed and the Achilles tendon is intact. There is no pain along the plantar aspect of the calcaneus. There is no pain with lateral compression of the calcaneus or pain with vibratory sensation. No overlying edema, erythema, increase in warmth. There is mild discomfort along the medial band of the plantar fascial in the arch of the foot. There is no area pinpoint bony tenderness or pain with vibratory sensation to bilateral lower extremities. No overlying edema, erythema, increase in warmth bilaterally. MMT 5/5, ROM WNL No open lesions or pre-ulcerative lesions identified bilaterally. No pain with calf  compression, swelling, warmth, erythema.    Assessment & Plan:   44 year old female with right arch pain, Achilles tendinitis  -X-rays were obtained and reviewed with the patient. -Treatment options were discussed the patient including alternatives, risks, complications. Discussed likely etiology. -Discussed stretching exercises. -Dispensed night splint. -Prescribed meloxicam. Discussed side effects the medication directed to stop immediately should any occur. -I do believe that she been fit long-term with orthotics given her foot type and activity. We'll discuss in more detail next appointment. In the meantime she will look at purchasing OTC orthotics. I directed her on what to look for.  -Follow-up in 4 weeks or sooner if any problems are to arise. In the meantime encouraged call the office with any questions, concerns, change in symptoms.

## 2015-01-26 ENCOUNTER — Telehealth: Payer: Self-pay

## 2015-01-26 NOTE — Telephone Encounter (Signed)
Spoke with pt regarding heel pain, advised her that she could increase her Mobic from 7.5mg  daily to 15mg  daily, she can alos buy a heel lift to wear in her shoes to help relieve the pressure off her achilles tendon. If this did not resolve her heel pain we could call in a compound cream to use as a topical pain relief.

## 2015-01-26 NOTE — Telephone Encounter (Signed)
Left message for patient to return my call regarding additional therapies for her heel pain

## 2015-01-28 ENCOUNTER — Ambulatory Visit: Payer: BLUE CROSS/BLUE SHIELD | Admitting: Podiatry

## 2015-02-20 ENCOUNTER — Ambulatory Visit: Payer: BLUE CROSS/BLUE SHIELD | Admitting: Podiatry

## 2015-04-15 ENCOUNTER — Encounter (HOSPITAL_BASED_OUTPATIENT_CLINIC_OR_DEPARTMENT_OTHER): Payer: Self-pay | Admitting: *Deleted

## 2015-04-15 ENCOUNTER — Emergency Department (HOSPITAL_BASED_OUTPATIENT_CLINIC_OR_DEPARTMENT_OTHER)
Admission: EM | Admit: 2015-04-15 | Discharge: 2015-04-15 | Disposition: A | Discharge status: Home / Self Care | Payer: BLUE CROSS/BLUE SHIELD | Attending: Emergency Medicine | Admitting: Emergency Medicine

## 2015-04-15 DIAGNOSIS — X58XXXA Exposure to other specified factors, initial encounter: Secondary | ICD-10-CM | POA: Insufficient documentation

## 2015-04-15 DIAGNOSIS — Y9389 Activity, other specified: Secondary | ICD-10-CM | POA: Insufficient documentation

## 2015-04-15 DIAGNOSIS — Z79899 Other long term (current) drug therapy: Secondary | ICD-10-CM | POA: Insufficient documentation

## 2015-04-15 DIAGNOSIS — T783XXA Angioneurotic edema, initial encounter: Secondary | ICD-10-CM

## 2015-04-15 DIAGNOSIS — F329 Major depressive disorder, single episode, unspecified: Secondary | ICD-10-CM | POA: Insufficient documentation

## 2015-04-15 DIAGNOSIS — I10 Essential (primary) hypertension: Secondary | ICD-10-CM | POA: Insufficient documentation

## 2015-04-15 DIAGNOSIS — Z862 Personal history of diseases of the blood and blood-forming organs and certain disorders involving the immune mechanism: Secondary | ICD-10-CM | POA: Insufficient documentation

## 2015-04-15 DIAGNOSIS — Y9289 Other specified places as the place of occurrence of the external cause: Secondary | ICD-10-CM | POA: Insufficient documentation

## 2015-04-15 DIAGNOSIS — Z791 Long term (current) use of non-steroidal anti-inflammatories (NSAID): Secondary | ICD-10-CM | POA: Insufficient documentation

## 2015-04-15 DIAGNOSIS — Y998 Other external cause status: Secondary | ICD-10-CM | POA: Insufficient documentation

## 2015-04-15 MED ORDER — DIPHENHYDRAMINE HCL 25 MG PO CAPS
50.0000 mg | ORAL_CAPSULE | Freq: Once | ORAL | Status: AC
  Administered 2015-04-15: 50 mg via ORAL
  Filled 2015-04-15: qty 2

## 2015-04-15 MED ORDER — DIPHENHYDRAMINE HCL 25 MG PO CAPS: 25.0000 mg | ORAL_CAPSULE | Freq: Three times a day (TID) | ORAL | Status: DC | PRN

## 2015-04-15 MED ORDER — FAMOTIDINE 20 MG PO TABS: 20.0000 mg | ORAL_TABLET | Freq: Every day | ORAL | Status: DC

## 2015-04-15 MED ORDER — FAMOTIDINE 20 MG PO TABS
20.0000 mg | ORAL_TABLET | Freq: Once | ORAL | Status: AC
  Administered 2015-04-15: 20 mg via ORAL
  Filled 2015-04-15: qty 1

## 2015-04-15 MED ORDER — PREDNISONE 20 MG PO TABS: 60.0000 mg | ORAL_TABLET | Freq: Every day | ORAL | Status: DC

## 2015-04-15 MED ORDER — DEXAMETHASONE SODIUM PHOSPHATE 10 MG/ML IJ SOLN
10.0000 mg | Freq: Once | INTRAMUSCULAR | Status: AC
  Administered 2015-04-15: 10 mg via INTRAMUSCULAR
  Filled 2015-04-15: qty 1

## 2015-04-15 NOTE — Discharge Instructions (Signed)
Angioedema °Angioedema is a sudden swelling of tissues, often of the skin. It can occur on the face or genitals or in the abdomen or other body parts. The swelling usually develops over a short period and gets better in 24 to 48 hours. It often begins during the night and is found when the person wakes up. The person may also get red, itchy patches of skin (hives). Angioedema can be dangerous if it involves swelling of the air passages.  °Depending on the cause, episodes of angioedema may only happen once, come back in unpredictable patterns, or repeat for several years and then gradually fade away.  °CAUSES  °Angioedema can be caused by an allergic reaction to various triggers. It can also result from nonallergic causes, including reactions to drugs, immune system disorders, viral infections, or an abnormal gene that is passed to you from your parents (hereditary). For some people with angioedema, the cause is unknown.  °Some things that can trigger angioedema include:  °· Foods.   °· Medicines, such as ACE inhibitors, ARBs, nonsteroidal anti-inflammatory agents, or estrogen.   °· Latex.   °· Animal saliva.   °· Insect stings.   °· Dyes used in X-rays.   °· Mild injury.   °· Dental work. °· Surgery. °· Stress.   °· Sudden changes in temperature.   °· Exercise. °SIGNS AND SYMPTOMS  °· Swelling of the skin. °· Hives. If these are present, there is also intense itching. °· Redness in the affected area.   °· Pain in the affected area. °· Swollen lips or tongue. °· Breathing problems. This may happen if the air passages swell. °· Wheezing. °If internal organs are involved, there may be:  °· Nausea.   °· Abdominal pain.   °· Vomiting.   °· Difficulty swallowing.   °· Difficulty passing urine. °DIAGNOSIS  °· Your health care provider will examine the affected area and take a medical and family history. °· Various tests may be done to help determine the cause. Tests may include: °¨ Allergy skin tests to see if the problem  is an allergic reaction.   °¨ Blood tests to check for hereditary angioedema.   °¨ Tests to check for underlying diseases that could cause the condition.   °· A review of your medicines, including over-the-counter medicines, may be done. °TREATMENT  °Treatment will depend on the cause of the angioedema. Possible treatments include:  °· Removal of anything that triggered the condition (such as stopping certain medicines).   °· Medicines to treat symptoms or prevent attacks. Medicines given may include:   °¨ Antihistamines.   °¨ Epinephrine injection.   °¨ Steroids.   °· Hospitalization may be required for severe attacks. If the air passages are affected, it can be an emergency. Tubes may need to be placed to keep the airway open. °HOME CARE INSTRUCTIONS  °· Take all medicines as directed by your health care provider. °· If you were given medicines for emergency allergy treatment, always carry them with you. °· Wear a medical bracelet as directed by your health care provider.   °· Avoid known triggers. °SEEK MEDICAL CARE IF:  °· You have repeat attacks of angioedema.   °· Your attacks are more frequent or more severe despite preventive measures.   °· You have hereditary angioedema and are considering having children. It is important to discuss with your health care provider the risks of passing the condition on to your children. °SEEK IMMEDIATE MEDICAL CARE IF:  °· You have severe swelling of the mouth, tongue, or lips. °· You have difficulty breathing.   °· You have difficulty swallowing.   °· You faint. °MAKE   SURE YOU: °· Understand these instructions. °· Will watch your condition. °· Will get help right away if you are not doing well or get worse. °Document Released: 01/08/2002 Document Revised: 03/16/2014 Document Reviewed: 06/23/2013 °[employer]® Patient Information ©2015 [employer]. This information is not intended to replace advice given to you by your health care provider. Make sure you discuss any questions  you have with your health care provider. ° °

## 2015-04-15 NOTE — ED Notes (Signed)
Facial swelling onset yesterday
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
## 2016-02-21 ENCOUNTER — Ambulatory Visit (INDEPENDENT_AMBULATORY_CARE_PROVIDER_SITE_OTHER): Payer: BLUE CROSS/BLUE SHIELD | Admitting: Allergy and Immunology

## 2016-02-21 ENCOUNTER — Encounter: Payer: Self-pay | Admitting: Allergy and Immunology

## 2016-02-21 VITALS — BP 130/88 | HR 76 | Temp 98.5°F | Resp 16 | Ht 63.19 in | Wt 263.0 lb

## 2016-02-21 DIAGNOSIS — L500 Allergic urticaria: Secondary | ICD-10-CM

## 2016-02-21 DIAGNOSIS — K2970 Gastritis, unspecified, without bleeding: Secondary | ICD-10-CM | POA: Diagnosis not present

## 2016-02-21 DIAGNOSIS — R062 Wheezing: Secondary | ICD-10-CM | POA: Diagnosis not present

## 2016-02-21 DIAGNOSIS — J3089 Other allergic rhinitis: Secondary | ICD-10-CM | POA: Diagnosis not present

## 2016-02-21 DIAGNOSIS — J4520 Mild intermittent asthma, uncomplicated: Secondary | ICD-10-CM | POA: Insufficient documentation

## 2016-02-21 DIAGNOSIS — T783XXA Angioneurotic edema, initial encounter: Secondary | ICD-10-CM | POA: Diagnosis not present

## 2016-02-21 LAB — CBC WITH DIFFERENTIAL/PLATELET
Basophils Absolute: 0 cells/uL (ref 0–200)
Basophils Relative: 0 %
Eosinophils Absolute: 320 cells/uL (ref 15–500)
Eosinophils Relative: 5 %
HCT: 34.2 % — ABNORMAL LOW (ref 35.0–45.0)
Hemoglobin: 11.4 g/dL — ABNORMAL LOW (ref 11.7–15.5)
Lymphocytes Relative: 32 %
Lymphs Abs: 2048 cells/uL (ref 850–3900)
MCH: 28.2 pg (ref 27.0–33.0)
MCHC: 33.3 g/dL (ref 32.0–36.0)
MCV: 84.7 fL (ref 80.0–100.0)
MPV: 11.3 fL (ref 7.5–12.5)
Monocytes Absolute: 384 cells/uL (ref 200–950)
Monocytes Relative: 6 %
Neutro Abs: 3648 cells/uL (ref 1500–7800)
Neutrophils Relative %: 57 %
Platelets: 244 10*3/uL (ref 140–400)
RBC: 4.04 MIL/uL (ref 3.80–5.10)
RDW: 14.3 % (ref 11.0–15.0)
WBC: 6.4 10*3/uL (ref 3.8–10.8)

## 2016-02-21 LAB — COMPREHENSIVE METABOLIC PANEL
ALT: 23 U/L (ref 6–29)
AST: 19 U/L (ref 10–30)
Albumin: 3.9 g/dL (ref 3.6–5.1)
Alkaline Phosphatase: 54 U/L (ref 33–115)
BUN: 16 mg/dL (ref 7–25)
CO2: 24 mmol/L (ref 20–31)
Calcium: 9.1 mg/dL (ref 8.6–10.2)
Chloride: 106 mmol/L (ref 98–110)
Creat: 0.85 mg/dL (ref 0.50–1.10)
Glucose, Bld: 96 mg/dL (ref 65–99)
Potassium: 4.1 mmol/L (ref 3.5–5.3)
Sodium: 140 mmol/L (ref 135–146)
Total Bilirubin: 0.2 mg/dL (ref 0.2–1.2)
Total Protein: 6.7 g/dL (ref 6.1–8.1)

## 2016-02-21 MED ORDER — RANITIDINE HCL 150 MG PO TABS: 150.0000 mg | ORAL_TABLET | Freq: Two times a day (BID) | ORAL | Status: DC

## 2016-02-21 MED ORDER — FLUTICASONE PROPIONATE 50 MCG/ACT NA SUSP: 2.0000 | Freq: Every day | NASAL | Status: DC

## 2016-02-21 MED ORDER — LEVOCETIRIZINE DIHYDROCHLORIDE 5 MG PO TABS: 5.0000 mg | ORAL_TABLET | Freq: Every evening | ORAL | Status: DC

## 2016-02-21 MED ORDER — ALBUTEROL SULFATE HFA 108 (90 BASE) MCG/ACT IN AERS: 2.0000 | INHALATION_SPRAY | RESPIRATORY_TRACT | Status: DC | PRN

## 2016-02-21 NOTE — Patient Instructions (Addendum)
Chronic urticaria Urticaria versus other dermatitis. Skin tests to select food allergens were negative today. Physical urticarias are negative by history (i.e. pressure-induced, temperature, vibration, solar, etc.). There are no concomitant symptoms concerning for anaphylaxis or constitutional symptoms worrisome for an underlying malignancy. We will rule out other potential etiologies with labs. For symptom relief, patient is to take oral antihistamines as directed.  The following labs have been ordered: FCeRI antibody, TSH, anti-thyroglobulin antibody, thyroid peroxidase antibody, tryptase, urea breath test, CBC, CMP, and galactose-alpha-1,3-galactose IgE level.  The patient will be called with further recommendations after lab results have returned.  Instructions have been provided and discussed for H1/H2 receptor blockade with titration to find lowest effective dose.  A journal is to be kept recording any foods eaten, beverages consumed, medications taken within a 6 hour period prior to the onset of symptoms, as well as record activities being performed, and environmental conditions. For any symptoms concerning for anaphylaxis, 911 is to be called immediately.  Angioedema Associated angioedema occurs in up to 50% of patients with chronic urticaria.  Treatment/diagnostic plan as outlined above.  We will check C4 along with other labs as a screening test to rule out complement related etiologies.  Perennial allergic rhinitis  Aeroallergen avoidance measures have been discussed and provided in written form.  A prescription has been provided for fluticasone nasal spray, 2 sprays per nostril daily as needed. Proper nasal spray technique has been discussed and demonstrated.  A prescription has been provided for levocetirizine, 5 mg daily as needed.  Wheezing Intermittent coughing/wheezing.  Continue albuterol HFA, 1-2 inhalations every 4-6 hours as needed and 15 minutes prior to  exercise.  Subjective and objective measures of pulmonary function will be followed and the treatment plan will be adjusted accordingly.    Return in about 4 weeks (around 03/20/2016), or if symptoms worsen or fail to improve.  Urticaria (Hives)  . Levocetirizine (Xyzal) 5 mg in morning and Cetirizine (Zyrtec) 10mg  at night and ranitidine (Zantac) 150 mg twice a day. If no symptoms for 7-14 days then decrease to. . Levocetirizine (Xyzal) 5 mg in morning and Cetirizine (Zyrtec) 10mg  at night and ranitidine (Zantac) 150 mg once a day.  If no symptoms for 7-14 days then decrease to. . Levocetirizine (Xyzal) 5 mg in morning and Cetirizine (Zyrtec) 10mg  at night.  If no symptoms for 7-14 days then decrease to. . Levocetirizine (Xyzal) 5 mg once a day.  May use Benadryl (diphenhydramine) as needed for breakthrough symptoms       If symptoms return, then step up dosage  Control of [place] dust mites play a major role in allergic asthma and rhinitis.  They occur in environments with high humidity wherever human skin, the food for dust mites is found. High levels have been detected in dust obtained from mattresses, pillows, carpets, upholstered furniture, bed covers, clothes and soft toys.  The principal allergen of the house dust mite is found in its feces.  A gram of dust may contain 1,000 mites and 250,000 fecal particles.  Mite antigen is easily measured in the air during house cleaning activities.    1. Encase mattresses, including the box spring, and pillow, in an air tight cover.  Seal the zipper end of the encased mattresses with wide adhesive tape. 2. Wash the bedding in water of 130 degrees Farenheit weekly.  Avoid cotton comforters/quilts and flannel bedding: the most ideal bed covering is the dacron comforter. 3. Remove all upholstered furniture  from the bedroom. 4. Remove carpets, carpet padding, rugs, and non-washable window drapes from the bedroom.  Wash drapes  weekly or use plastic window coverings. 5. Remove all non-washable stuffed toys from the bedroom.  Wash stuffed toys weekly. 6. Have the room cleaned frequently with a vacuum cleaner and a damp dust-mop.  The patient should not be in a room which is being cleaned and should wait 1 hour after cleaning before going into the room. 7. Close and seal all heating outlets in the bedroom.  Otherwise, the room will become filled with dust-laden air.  An electric heater can be used to heat the room. 8. Reduce indoor humidity to less than 50%.  Do not use a humidifier.

## 2016-02-21 NOTE — Assessment & Plan Note (Signed)
   Aeroallergen avoidance measures have been discussed and provided in written form.  A prescription has been provided for fluticasone nasal spray, 2 sprays per nostril daily as needed. Proper nasal spray technique has been discussed and demonstrated.  A prescription has been provided for levocetirizine, 5 mg daily as needed.

## 2016-02-21 NOTE — Assessment & Plan Note (Addendum)
Urticaria versus other dermatitis. Skin tests to select food allergens were negative today. Physical urticarias are negative by history (i.e. pressure-induced, temperature, vibration, solar, etc.). There are no concomitant symptoms concerning for anaphylaxis or constitutional symptoms worrisome for an underlying malignancy. We will rule out other potential etiologies with labs. For symptom relief, patient is to take oral antihistamines as directed.  The following labs have been ordered: FCeRI antibody, TSH, anti-thyroglobulin antibody, thyroid peroxidase antibody, tryptase, urea breath test, CBC, CMP, and galactose-alpha-1,3-galactose IgE level.  The patient will be called with further recommendations after lab results have returned.  Instructions have been provided and discussed for H1/H2 receptor blockade with titration to find lowest effective dose.  A journal is to be kept recording any foods eaten, beverages consumed, medications taken within a 6 hour period prior to the onset of symptoms, as well as record activities being performed, and environmental conditions. For any symptoms concerning for anaphylaxis, 911 is to be called immediately.

## 2016-02-21 NOTE — Assessment & Plan Note (Addendum)
Associated angioedema occurs in up to 50% of patients with chronic urticaria.  Treatment/diagnostic plan as outlined above.  We will check C4 along with other labs as a screening test to rule out complement related etiologies.
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
## 2016-02-21 NOTE — Assessment & Plan Note (Addendum)
Intermittent coughing/wheezing.  Continue albuterol HFA, 1-2 inhalations every 4-6 hours as needed and 15 minutes prior to exercise.  Subjective and objective measures of pulmonary function will be followed and the treatment plan will be adjusted accordingly.

## 2016-02-22 LAB — TRYPTASE: Tryptase: 6.4 ug/L (ref <11)

## 2016-02-22 LAB — H. PYLORI BREATH TEST

## 2016-02-22 LAB — C4 COMPLEMENT: C4 Complement: 39 mg/dL (ref 10–40)

## 2016-02-24 ENCOUNTER — Other Ambulatory Visit: Payer: Self-pay | Admitting: Allergy and Immunology

## 2016-02-24 LAB — GALACTOSE-ALPHA-1,3-GALACTOSE IGE: Galactose-alpha-1,3-galactose IgE: <0.1 kU/L (ref <0.35)

## 2016-02-25 LAB — H. PYLORI BREATH TEST: H. pylori Breath Test: NOT DETECTED

## 2016-02-28 LAB — CP CHRONIC URTICARIA INDEX PANEL
Histamine Release: <16 % (ref <16)
TSH: 0.84 mIU/L
Thyroglobulin Ab: <1 IU/mL (ref <2)
Thyroperoxidase Ab SerPl-aCnc: <1 IU/mL (ref <9)

## 2016-03-20 ENCOUNTER — Ambulatory Visit (INDEPENDENT_AMBULATORY_CARE_PROVIDER_SITE_OTHER): Payer: BLUE CROSS/BLUE SHIELD | Admitting: Allergy and Immunology

## 2016-03-20 ENCOUNTER — Encounter: Payer: Self-pay | Admitting: Allergy and Immunology

## 2016-03-20 VITALS — BP 110/80 | HR 72 | Temp 98.0°F | Resp 16

## 2016-03-20 DIAGNOSIS — L309 Dermatitis, unspecified: Secondary | ICD-10-CM | POA: Diagnosis not present

## 2016-03-20 DIAGNOSIS — J3089 Other allergic rhinitis: Secondary | ICD-10-CM | POA: Diagnosis not present

## 2016-03-20 DIAGNOSIS — R062 Wheezing: Secondary | ICD-10-CM | POA: Diagnosis not present

## 2016-03-20 MED ORDER — MONTELUKAST SODIUM 10 MG PO TABS: 10.0000 mg | ORAL_TABLET | Freq: Every day | ORAL | Status: DC

## 2016-03-20 MED ORDER — DOXEPIN HCL 25 MG PO CAPS: 25.0000 mg | ORAL_CAPSULE | Freq: Every day | ORAL | Status: DC

## 2016-03-20 NOTE — Assessment & Plan Note (Signed)
Dermatitis versus urticaria.  Continue H1/H2 receptor blockade, titrating to the lowest effective dose necessary to suppress urticaria.  A prescription has been provided for montelukast 10 mg daily bedtime.  A prescription has been provided for doxepin 25 mg daily at bedtime as needed.  I recommended further evaluation by her dermatologist with biopsy of an active lesion to help in determining an etiology.  If biopsy reveals urticaria or is indeterminate, we will attempt a therapeutic trial with omalizumab

## 2016-03-20 NOTE — Progress Notes (Signed)
Follow-up Note  RE: Jordan Bush MRN: [id number] DOB: 1971/02/23 Date of Office Visit: 03/20/2016  Primary care provider: Elyn Peers, MD Referring provider: Lucianne Lei, MD  History of present illness: HPI Comments: Jordan Bush is a 45 y.o. female with dermatitis, perennial allergic rhinitis, and history of wheezing who presents today for follow up.  She is previously seen in this office on 02/21/2016.  She reports that despite taking cetirizine 10 mg twice a day and ranitidine 150 mg daily at bedtime she has still been experiencing a rash on her legs and back.  The rash is described as "tiny red itchy bumps."  She denies hives or vesicles.  In the interval since her initial visit she has not experienced angioedema.  She denies concomitant cardiopulmonary or GI symptoms and has been unable to identify any specific medication, food, or environmental triggers.  Food allergen skin tests and laboratory results were negative/within normal limits.  She has no upper or lower respiratory symptom complaints today.   Assessment and plan: Dermatitis Dermatitis versus urticaria.  Continue H1/H2 receptor blockade, titrating to the lowest effective dose necessary to suppress urticaria.  A prescription has been provided for montelukast 10 mg daily bedtime.  A prescription has been provided for doxepin 25 mg daily at bedtime as needed.  I recommended further evaluation by her dermatologist with biopsy of an active lesion to help in determining an etiology.  If biopsy reveals urticaria or is indeterminate, we will attempt a therapeutic trial with omalizumab  Perennial allergic rhinitis  Continue appropriate allergen avoidance measures, levocetirizine 5 mg daily as needed, and fluticasone nasal spray as needed.  A prescription has been provided for montelukast (as above).  Wheezing Intermittent coughing/wheezing.  Continue albuterol HFA, 1-2 inhalations every 4-6 hours as needed and 15  minutes prior to exercise.  Subjective and objective measures of pulmonary function will be followed and the treatment plan will be adjusted accordingly.    Meds ordered this encounter  Medications  . montelukast (SINGULAIR) 10 MG tablet    Sig: Take 1 tablet (10 mg total) by mouth at bedtime.    Dispense:  30 tablet    Refill:  5  . doxepin (SINEQUAN) 25 MG capsule    Sig: Take 1 capsule (25 mg total) by mouth at bedtime.    Dispense:  30 capsule    Refill:  5      Physical examination: Blood pressure 110/80, pulse 72, temperature 98 F (36.7 C), temperature source Oral, resp. rate 16.  General: Alert, interactive, in no acute distress. HEENT: TMs pearly gray, turbinates mildly edematous without discharge, post-pharynx unremarkable. Neck: Supple without lymphadenopathy. Lungs: Clear to auscultation without wheezing, rhonchi or rales. CV: Normal S1, S2 without murmurs. Skin: Scattered 1-12mm erythematous papules on lower extremities and back .  The following portions of the patient's history were reviewed and updated as appropriate: allergies, current medications, past family history, past medical history, past social history, past surgical history and problem list.    Medication List       This list is accurate as of: 03/20/16  7:43 PM.  Always use your most recent med list.               albuterol 108 (90 Base) MCG/ACT inhaler  Commonly known as:  PROAIR HFA  Inhale 2 puffs into the lungs every 4 (four) hours as needed for wheezing or shortness of breath.     carvedilol 25 MG tablet  Commonly known  as:  COREG  TK 1 T PO BID     doxepin 25 MG capsule  Commonly known as:  SINEQUAN  Take 1 capsule (25 mg total) by mouth at bedtime.     fluticasone 50 MCG/ACT nasal spray  Commonly known as:  FLONASE  Place 2 sprays into both nostrils daily.     hydrOXYzine 10 MG tablet  Commonly known as:  ATARAX/VISTARIL  TK 1 TO 3 TS PO QHS PRF ITCHING     levocetirizine 5  MG tablet  Commonly known as:  XYZAL  Take 1 tablet (5 mg total) by mouth every evening.     LORazepam 1 MG tablet  Commonly known as:  ATIVAN  TAKE HALF TO ONE TABLET BY MOUTH EVERY 8 TO 12 HOURS AS NEEDED FOR SLEEP     montelukast 10 MG tablet  Commonly known as:  SINGULAIR  Take 1 tablet (10 mg total) by mouth at bedtime.     multivitamin with minerals tablet  Take 1 tablet by mouth daily.     ranitidine 150 MG tablet  Commonly known as:  ZANTAC  Take 1 tablet (150 mg total) by mouth 2 (two) times daily.     triamcinolone cream 0.1 %  Commonly known as:  KENALOG  APP AA BID PRN        Allergies  Allergen Reactions  . Ace Inhibitors   . Lisinopril   . Other     Trees, grass, bed bugs Trees, grass, bed bugs    I appreciate the opportunity to take part in this Jordan Bush care. Please do not hesitate to contact me with questions.  Sincerely,   R. Edgar Frisk, MD

## 2016-03-20 NOTE — Patient Instructions (Addendum)
Dermatitis Dermatitis versus urticaria.  Continue H1/H2 receptor blockade, titrating to the lowest effective dose necessary to suppress urticaria.  A prescription has been provided for montelukast 10 mg daily bedtime.  A prescription has been provided for doxepin 25 mg daily at bedtime as needed.  I recommended further evaluation by her dermatologist with biopsy of an active lesion to help in determining an etiology.  If biopsy reveals urticaria or is indeterminate, we will attempt a therapeutic trial with omalizumab  Perennial allergic rhinitis  Continue appropriate allergen avoidance measures, levocetirizine 5 mg daily as needed, and fluticasone nasal spray as needed.  A prescription has been provided for montelukast (as above).  Wheezing Intermittent coughing/wheezing.  Continue albuterol HFA, 1-2 inhalations every 4-6 hours as needed and 15 minutes prior to exercise.  Subjective and objective measures of pulmonary function will be followed and the treatment plan will be adjusted accordingly.    Return if symptoms worsen or fail to improve.

## 2016-03-20 NOTE — Assessment & Plan Note (Signed)
   Continue appropriate allergen avoidance measures, levocetirizine 5 mg daily as needed, and fluticasone nasal spray as needed.  A prescription has been provided for montelukast (as above).

## 2016-03-20 NOTE — Assessment & Plan Note (Signed)
Intermittent coughing/wheezing.  Continue albuterol HFA, 1-2 inhalations every 4-6 hours as needed and 15 minutes prior to exercise.  Subjective and objective measures of pulmonary function will be followed and the treatment plan will be adjusted accordingly.

## 2016-10-19 ENCOUNTER — Emergency Department (INDEPENDENT_AMBULATORY_CARE_PROVIDER_SITE_OTHER)
Admission: EM | Admit: 2016-10-19 | Discharge: 2016-10-19 | Disposition: A | Discharge status: Home / Self Care | Payer: PRIVATE HEALTH INSURANCE | Source: Home / Self Care | Attending: Family Medicine | Admitting: Family Medicine

## 2016-10-19 ENCOUNTER — Encounter: Payer: Self-pay | Admitting: Emergency Medicine

## 2016-10-19 DIAGNOSIS — J069 Acute upper respiratory infection, unspecified: Secondary | ICD-10-CM | POA: Diagnosis not present

## 2016-10-19 DIAGNOSIS — B9789 Other viral agents as the cause of diseases classified elsewhere: Secondary | ICD-10-CM

## 2016-10-19 MED ORDER — GUAIFENESIN ER 600 MG PO TB12: 600.0000 mg | ORAL_TABLET | Freq: Two times a day (BID) | ORAL | 0 refills | Status: DC

## 2016-10-19 MED ORDER — BENZONATATE 100 MG PO CAPS: 100.0000 mg | ORAL_CAPSULE | Freq: Three times a day (TID) | ORAL | 0 refills | Status: DC

## 2016-10-19 MED ORDER — AMOXICILLIN 500 MG PO CAPS: 500.0000 mg | ORAL_CAPSULE | Freq: Three times a day (TID) | ORAL | 0 refills | Status: DC

## 2016-10-19 MED ORDER — FLUTICASONE PROPIONATE 50 MCG/ACT NA SUSP: 2.0000 | Freq: Every day | NASAL | 2 refills | Status: DC

## 2016-10-19 MED ORDER — ALBUTEROL SULFATE HFA 108 (90 BASE) MCG/ACT IN AERS: 1.0000 | INHALATION_SPRAY | Freq: Four times a day (QID) | RESPIRATORY_TRACT | 0 refills | Status: DC | PRN

## 2016-10-19 NOTE — Discharge Instructions (Signed)
°  Your symptoms are likely due to a virus such as the common cold, however, if you developing worsening chest congestion with shortness of breath, persistent fever (100.4*F) for 3 days, or symptoms not improving in 4-5 days, you may fill the antibiotic (amoxicillin).  If you do fill the antibiotic,  please take antibiotics as prescribed and be sure to complete entire course even if you start to feel better to ensure infection does not come back. ° ° °
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
## 2016-10-19 NOTE — ED Triage Notes (Signed)
Cough, congestion, body aches, headache, fatigue x 3 days

## 2016-10-27 ENCOUNTER — Encounter (HOSPITAL_BASED_OUTPATIENT_CLINIC_OR_DEPARTMENT_OTHER): Payer: Self-pay | Admitting: *Deleted

## 2016-10-27 ENCOUNTER — Emergency Department (HOSPITAL_BASED_OUTPATIENT_CLINIC_OR_DEPARTMENT_OTHER)
Admission: EM | Admit: 2016-10-27 | Discharge: 2016-10-28 | Disposition: A | Discharge status: Home / Self Care | Payer: 59 | Attending: Emergency Medicine | Admitting: Emergency Medicine

## 2016-10-27 DIAGNOSIS — R220 Localized swelling, mass and lump, head: Secondary | ICD-10-CM | POA: Insufficient documentation

## 2016-10-27 DIAGNOSIS — J029 Acute pharyngitis, unspecified: Secondary | ICD-10-CM | POA: Diagnosis not present

## 2016-10-27 DIAGNOSIS — I10 Essential (primary) hypertension: Secondary | ICD-10-CM | POA: Insufficient documentation

## 2016-10-27 DIAGNOSIS — Z79899 Other long term (current) drug therapy: Secondary | ICD-10-CM | POA: Insufficient documentation

## 2016-10-27 LAB — RAPID STREP SCREEN (MED CTR MEBANE ONLY): STREPTOCOCCUS, GROUP A SCREEN (DIRECT): NEGATIVE

## 2016-10-27 MED ORDER — PREDNISONE 20 MG PO TABS
40.0000 mg | ORAL_TABLET | Freq: Once | ORAL | Status: AC
  Administered 2016-10-27: 40 mg via ORAL
  Filled 2016-10-27: qty 2

## 2016-10-27 NOTE — ED Triage Notes (Signed)
Reports that she has had a sore throat since eating today.  Reports that she has taken 2 benadryl without relief.  Pt reports intermittent facial swelling-none obvious in triage.  Speaking in complete sentences, breathing without difficulty.  Pt has had tonsils removed.
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
## 2016-10-28 MED ORDER — PREDNISONE 10 MG PO TABS: 20.0000 mg | ORAL_TABLET | Freq: Two times a day (BID) | ORAL | 0 refills | Status: DC

## 2016-10-28 NOTE — Discharge Instructions (Signed)
Prednisone as prescribed.  Ibuprofen 600 mg every 6 hours as needed for pain.  Return to the emergency department if you develop and inability to swallow, difficulty breathing, or a significant worsening of your symptoms.

## 2016-10-30 LAB — CULTURE, GROUP A STREP (THRC)

## 2016-11-16 ENCOUNTER — Other Ambulatory Visit: Payer: Self-pay | Admitting: Obstetrics and Gynecology

## 2017-01-01 ENCOUNTER — Ambulatory Visit (INDEPENDENT_AMBULATORY_CARE_PROVIDER_SITE_OTHER): Payer: PRIVATE HEALTH INSURANCE | Admitting: Allergy and Immunology

## 2017-01-01 ENCOUNTER — Encounter: Payer: Self-pay | Admitting: Allergy and Immunology

## 2017-01-01 ENCOUNTER — Encounter (INDEPENDENT_AMBULATORY_CARE_PROVIDER_SITE_OTHER): Payer: Self-pay

## 2017-01-01 VITALS — BP 122/80 | HR 68 | Temp 98.1°F | Resp 20 | Ht 62.75 in | Wt 264.2 lb

## 2017-01-01 DIAGNOSIS — T7840XA Allergy, unspecified, initial encounter: Secondary | ICD-10-CM

## 2017-01-01 DIAGNOSIS — J3089 Other allergic rhinitis: Secondary | ICD-10-CM

## 2017-01-01 DIAGNOSIS — L500 Allergic urticaria: Secondary | ICD-10-CM | POA: Diagnosis not present

## 2017-01-01 DIAGNOSIS — J4520 Mild intermittent asthma, uncomplicated: Secondary | ICD-10-CM | POA: Diagnosis not present

## 2017-01-01 DIAGNOSIS — T783XXD Angioneurotic edema, subsequent encounter: Secondary | ICD-10-CM | POA: Diagnosis not present

## 2017-01-01 MED ORDER — MONTELUKAST SODIUM 10 MG PO TABS: 10.0000 mg | ORAL_TABLET | Freq: Every day | ORAL | 5 refills | Status: DC

## 2017-01-01 MED ORDER — LEVOCETIRIZINE DIHYDROCHLORIDE 5 MG PO TABS: 5.0000 mg | ORAL_TABLET | Freq: Every evening | ORAL | 5 refills | Status: DC

## 2017-01-01 MED ORDER — EPINEPHRINE 0.3 MG/0.3ML IJ SOAJ: 0.3000 mg | Freq: Once | INTRAMUSCULAR | 1 refills | Status: DC

## 2017-01-01 NOTE — Patient Instructions (Addendum)
Allergic reaction The patient's history suggests allergic reaction with an unclear trigger. Food allergen skin tests were negative today despite a positive histamine control. The negative predictive value for skin tests is excellent (greater than 95%). We will proceed with in vitro lab studies to clarify the etiology.  The following labs have been ordered: FCeRI antibody, TSH, anti-thyroglobulin antibody, thyroid peroxidase antibody, C4, baseline serum tryptase, and serum specific IgE against galactose-alpha-1,3-galactose panel. An additional lab order for serum tryptase has been provided which is to be kept by the patient to be drawn in the emergency department within 4 hours of symptom onset should symptoms recur.  Should symptoms recur, the patient has been asked to keep a journal to record any foods eaten, beverages consumed, medications taken within a 6 hour period prior to the onset of symptoms, as well as record activities being performed, and environmental conditions. For any symptoms concerning for anaphylaxis, epinephrine is to be administered and 911 is to be called immediately.  A prescription has been provided for epinephrine 0.3 mg autoinjector 2 pack along with instructions for its proper administration.  Chronic urticaria Skin tests to select food allergens were negative today. Physical urticarias are negative by history (i.e. pressure-induced, temperature, vibration, solar, etc.). We will rule out other potential etiologies with labs. For symptom relief, patient is to take oral antihistamines as directed.  Labs have been ordered (as above).    The patient will be called with further recommendations after lab results have returned.  A prescription has been provided for levocetirizine, 5mg  daily as needed.  Instructions have been provided and discussed for H1/H2 receptor blockade with titration to find lowest effective dose.   A prescription has been provided for montelukast 10 mg  daily at bedtime.  A journal is to be kept recording any foods eaten, beverages consumed, medications taken within a 6 hour period prior to the onset of symptoms, as well as record activities being performed, and environmental conditions. For any symptoms concerning for anaphylaxis, 911 is to be called immediately.  Angioedema Associated angioedema occurs in up to 50% of patients with chronic urticaria.  Treatment/diagnostic plan as outlined above.  Perennial allergic rhinitis  Continue appropriate allergen avoidance measures and fluticasone nasal spray as needed.  Prescriptions have been provided for levocetirizine and montelukast (as above).  Mild intermittent asthma  Continue albuterol HFA, 1-2 inhalations every 4-6 hours as needed.  I have also encouraged the patient to take 1 or 2 inhalations of albuterol a proximally 15 minutes prior to exercise.  Subjective and objective measures of pulmonary function will be followed and the treatment plan will be adjusted accordingly.   When lab results have returned the patient will be called with further recommendations and follow up instructions.  Urticaria (Hives)  . Levocetirizine (Xyzal) 5 mg in morning and Cetirizine (Zyrtec) 10mg  at night and ranitidine (Zantac) 150 mg twice a day. If no symptoms for 7-14 days then decrease to. . Levocetirizine (Xyzal) 5 mg in morning and Cetirizine (Zyrtec) 10mg  at night and ranitidine (Zantac) 150 mg once a day.  If no symptoms for 7-14 days then decrease to. . Levocetirizine (Xyzal) 5 mg in morning and Cetirizine (Zyrtec) 10mg  at night.  If no symptoms for 7-14 days then decrease to. . Levocetirizine (Xyzal) 5 mg once a day.  May use Benadryl (diphenhydramine) as needed for breakthrough symptoms       If symptoms return, then step up dosage

## 2017-01-01 NOTE — Assessment & Plan Note (Signed)
Associated angioedema occurs in up to 50% of patients with chronic urticaria.  Treatment/diagnostic plan as outlined above. 

## 2017-01-01 NOTE — Assessment & Plan Note (Signed)
   Continue albuterol HFA, 1-2 inhalations every 4-6 hours as needed.  I have also encouraged the patient to take 1 or 2 inhalations of albuterol a proximally 15 minutes prior to exercise.  Subjective and objective measures of pulmonary function will be followed and the treatment plan will be adjusted accordingly.

## 2017-01-01 NOTE — Assessment & Plan Note (Addendum)
Skin tests to select food allergens were negative today. Physical urticarias are negative by history (i.e. pressure-induced, temperature, vibration, solar, etc.). We will rule out other potential etiologies with labs. For symptom relief, patient is to take oral antihistamines as directed.  Labs have been ordered (as above).    The patient will be called with further recommendations after lab results have returned.  A prescription has been provided for levocetirizine, 5mg  daily as needed.  Instructions have been provided and discussed for H1/H2 receptor blockade with titration to find lowest effective dose.   A prescription has been provided for montelukast 10 mg daily at bedtime.  A journal is to be kept recording any foods eaten, beverages consumed, medications taken within a 6 hour period prior to the onset of symptoms, as well as record activities being performed, and environmental conditions. For any symptoms concerning for anaphylaxis, 911 is to be called immediately.
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
## 2017-01-01 NOTE — Assessment & Plan Note (Signed)
The patient's history suggests allergic reaction with an unclear trigger. Food allergen skin tests were negative today despite a positive histamine control. The negative predictive value for skin tests is excellent (greater than 95%). We will proceed with in vitro lab studies to clarify the etiology.  The following labs have been ordered: FCeRI antibody, TSH, anti-thyroglobulin antibody, thyroid peroxidase antibody, C4, baseline serum tryptase, and serum specific IgE against galactose-alpha-1,3-galactose panel. An additional lab order for serum tryptase has been provided which is to be kept by the patient to be drawn in the emergency department within 4 hours of symptom onset should symptoms recur.  Should symptoms recur, the patient has been asked to keep a journal to record any foods eaten, beverages consumed, medications taken within a 6 hour period prior to the onset of symptoms, as well as record activities being performed, and environmental conditions. For any symptoms concerning for anaphylaxis, epinephrine is to be administered and 911 is to be called immediately.  A prescription has been provided for epinephrine 0.3 mg autoinjector 2 pack along with instructions for its proper administration.

## 2017-01-01 NOTE — Assessment & Plan Note (Signed)
   Continue appropriate allergen avoidance measures and fluticasone nasal spray as needed.  Prescriptions have been provided for levocetirizine and montelukast (as above).

## 2017-01-06 LAB — ALPHA-GAL PANEL
Alpha Gal IgE*: <0.1 kU/L (ref <0.35)
Beef (Bos spp) IgE: <0.1 kU/L (ref <0.35)
Class Interpretation: 0
Class Interpretation: 0
Class Interpretation: 0
Lamb/Mutton (Ovis spp) IgE: <0.1 kU/L (ref <0.35)
Pork (Sus spp) IgE: <0.1 kU/L (ref <0.35)

## 2017-01-06 LAB — C4 COMPLEMENT: Complement C4, Serum: 48 mg/dL — ABNORMAL HIGH (ref 14–44)

## 2017-01-06 LAB — CHRONIC URTICARIA: cu index: 14.2 — ABNORMAL HIGH (ref <10)

## 2017-01-06 LAB — TRYPTASE: Tryptase: 6.2 ug/L (ref 2.2–13.2)

## 2017-01-31 DIAGNOSIS — R87612 Low grade squamous intraepithelial lesion on cytologic smear of cervix (LGSIL): Secondary | ICD-10-CM | POA: Insufficient documentation

## 2017-03-15 ENCOUNTER — Other Ambulatory Visit: Payer: Self-pay | Admitting: Obstetrics and Gynecology

## 2017-04-02 ENCOUNTER — Encounter: Payer: Self-pay | Admitting: Gynecologic Oncology

## 2017-04-02 ENCOUNTER — Ambulatory Visit: Payer: 59 | Attending: Gynecologic Oncology | Admitting: Gynecologic Oncology

## 2017-04-02 VITALS — BP 139/90 | HR 60 | Temp 98.7°F | Resp 20 | Ht 63.35 in | Wt 269.1 lb

## 2017-04-02 DIAGNOSIS — Z8249 Family history of ischemic heart disease and other diseases of the circulatory system: Secondary | ICD-10-CM | POA: Diagnosis not present

## 2017-04-02 DIAGNOSIS — I10 Essential (primary) hypertension: Secondary | ICD-10-CM | POA: Diagnosis not present

## 2017-04-02 DIAGNOSIS — Z800 Family history of malignant neoplasm of digestive organs: Secondary | ICD-10-CM

## 2017-04-02 DIAGNOSIS — Z808 Family history of malignant neoplasm of other organs or systems: Secondary | ICD-10-CM

## 2017-04-02 DIAGNOSIS — E669 Obesity, unspecified: Secondary | ICD-10-CM | POA: Insufficient documentation

## 2017-04-02 DIAGNOSIS — Z9889 Other specified postprocedural states: Secondary | ICD-10-CM | POA: Diagnosis not present

## 2017-04-02 DIAGNOSIS — D069 Carcinoma in situ of cervix, unspecified: Secondary | ICD-10-CM

## 2017-04-02 DIAGNOSIS — Z79899 Other long term (current) drug therapy: Secondary | ICD-10-CM | POA: Insufficient documentation

## 2017-04-02 DIAGNOSIS — Z6841 Body Mass Index (BMI) 40.0 and over, adult: Secondary | ICD-10-CM | POA: Insufficient documentation
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
## 2017-04-02 NOTE — Patient Instructions (Signed)
The Scheduler for the Genetic's counseling will call you with an appointment.  Once those results are back your surgery will be scheduled.

## 2017-04-03 ENCOUNTER — Encounter: Payer: Self-pay | Admitting: Genetic Counselor

## 2017-04-03 ENCOUNTER — Telehealth: Payer: Self-pay | Admitting: Genetic Counselor

## 2017-04-03 NOTE — Telephone Encounter (Signed)
Appt has been scheduled for the pt to see Roma Kayser on 5/30 at 10am. Pt aware to arrive 15 minutes early. Letter mailed.

## 2017-04-04 ENCOUNTER — Telehealth: Payer: Self-pay | Admitting: *Deleted

## 2017-04-05 NOTE — Telephone Encounter (Signed)
Open bt mistake

## 2017-04-10 ENCOUNTER — Ambulatory Visit: Payer: Self-pay | Admitting: Allergy and Immunology

## 2017-04-11 ENCOUNTER — Encounter: Payer: Self-pay | Admitting: Genetic Counselor

## 2017-04-11 ENCOUNTER — Other Ambulatory Visit: Payer: 59

## 2017-04-11 ENCOUNTER — Ambulatory Visit (HOSPITAL_BASED_OUTPATIENT_CLINIC_OR_DEPARTMENT_OTHER): Payer: 59 | Admitting: Genetic Counselor

## 2017-04-11 DIAGNOSIS — Z800 Family history of malignant neoplasm of digestive organs: Secondary | ICD-10-CM | POA: Diagnosis not present

## 2017-04-11 DIAGNOSIS — Z8542 Personal history of malignant neoplasm of other parts of uterus: Secondary | ICD-10-CM | POA: Diagnosis not present

## 2017-04-11 DIAGNOSIS — Z7183 Encounter for nonprocreative genetic counseling: Secondary | ICD-10-CM
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
## 2017-04-19 ENCOUNTER — Encounter: Payer: Self-pay | Admitting: Genetic Counselor

## 2017-04-19 ENCOUNTER — Ambulatory Visit: Payer: Self-pay | Admitting: Genetic Counselor

## 2017-04-19 ENCOUNTER — Telehealth: Payer: Self-pay | Admitting: Genetic Counselor

## 2017-04-19 DIAGNOSIS — Z800 Family history of malignant neoplasm of digestive organs: Secondary | ICD-10-CM

## 2017-04-19 DIAGNOSIS — Z1379 Encounter for other screening for genetic and chromosomal anomalies: Secondary | ICD-10-CM | POA: Insufficient documentation

## 2017-04-19 NOTE — Telephone Encounter (Signed)
Revealed that she had a negative genetic test.  There were two VUS identified, one in ATM and the other in BRCA2.  WE do not change medical management based on these results.  I released a copy of these results to her online.

## 2017-04-19 NOTE — Progress Notes (Addendum)
HPI: Ms. Serafin was previously seen in the [facility] clinic due to a family history of cancer and concerns regarding a hereditary predisposition to cancer. Please refer to our prior cancer genetics clinic note for more information regarding Ms. Dollar's medical, social and family histories, and our assessment and recommendations, at the time. Ms. Munro recent genetic test results were disclosed to her, as were recommendations warranted by these results. These results and recommendations are discussed in more detail below.  CANCER HISTORY:   No history exists.    FAMILY HISTORY:  We obtained a detailed, 4-generation family history.  Significant diagnoses are listed below: Family History  Problem Relation Age of Onset  . Heart disease Father   . Cancer Father        colon  . Hypertension Father   . Diabetes Maternal Grandmother        unknown type  . Cancer Mother        rectal   . Irritable bowel syndrome Mother   . Hypertension Mother   . Asthma Mother   . Multiple myeloma Maternal Uncle   . Cancer Paternal Aunt        unknown type  . Allergic rhinitis Neg Hx   . Angioedema Neg Hx   . Atopy Neg Hx   . Immunodeficiency Neg Hx   . Eczema Neg Hx   . Urticaria Neg Hx     The patient has a son and daughter who are both cancer free.  She has a maternal half brother who is cancer free.  Her father died of pneumonia and heart failure, but on autopsy was reported to have possible colon cancer at age 38.  He had two sisters and two brothers.  One sister had possible pancreatic cancer and died in her 27's.  There is no other reported cancer history on the paternal side.  The patient's mother was diagnosed with colon cancer at 58 and a second colon cancer later that year at 70.  She also had esophageal cancer.  She had 12 siblings, and one brother has multiple myeloma.  The patient's maternal grandmother had some form of cancer.  Ms. Saulnier is unaware of previous family  history of genetic testing for hereditary cancer risks. Patient's maternal ancestors are of African American descent, and paternal ancestors are of African American descent. There is no reported Ashkenazi Jewish ancestry. There is no known consanguinity.  GENETIC TEST RESULTS: Genetic testing reported out on April 18, 2017 through the Common Hereditary cancer panel found no deleterious mutations.  The Hereditary Gene Panel offered by [facility] includes sequencing and/or deletion duplication testing of the following 46 genes: APC, ATM, AXIN2, BARD1, BMPR1A, BRCA1, BRCA2, BRIP1, CDH1, CDKN2A (p14ARF), CDKN2A (p16INK4a), CHEK2, CTNNA1, DICER1, EPCAM (Deletion/duplication testing only), GREM1 (promoter region deletion/duplication testing only), KIT, MEN1, MLH1, MSH2, MSH3, MSH6, MUTYH, NBN, NF1, NHTL1, PALB2, PDGFRA, PMS2, POLD1, POLE, PTEN, RAD50, RAD51C, RAD51D, SDHB, SDHC, SDHD, SMAD4, SMARCA4. STK11, TP53, TSC1, TSC2, and VHL.  The following genes were evaluated for sequence changes only: SDHA and HOXB13 c.251G>A variant only.  The test report has been scanned into EPIC and is located under the Molecular Pathology section of the Results Review tab.   We discussed with Ms. Miceli that since the current genetic testing is not perfect, it is possible there may be a gene mutation in one of these genes that current testing cannot detect, but that chance is small. We also discussed, that it is possible that another  gene that has not yet been discovered, or that we have not yet tested, is responsible for the cancer diagnoses in the family, and it is, therefore, important to remain in touch with cancer genetics in the future so that we can continue to offer Ms. Sheaffer the most up to date genetic testing.   Genetic testing did detect two Variants of Unknown Significance -one in the ATM gene called c.3628A>G and the other in BRCA2 called c.2233A>G.  At this time, it is unknown if these variants are associated with increased  cancer risk or if this is a normal finding, but most variants such as these get reclassified to being inconsequential. It should not be used to make medical management decisions. With time, we suspect the lab will determine the significance of this variant, if any. If we do learn more about them, we will try to contact Ms. Mctier to discuss it further. However, it is important to stay in touch with [name] periodically and keep the address and phone number up to date.  UPDATE: BRCA2 c.2233A>G VUS was reclassified to Likely Benign as a result of re-review of the evidence in light of new variant interpretation guidelines and/or new information. The updated report date is December 16, 2019.  UPDATE: ATM c.3628A>G VUS was reclassified to Likely Benign as a result of re-review of the evidence in light of new variant interpretation guidelines and/or new information.  The updated report date is May 26, 2020.    CANCER SCREENING RECOMMENDATIONS:  This normal result is reassuring and indicates that Ms. Gadd does not likely have an increased risk of cancer due to a mutation in one of these genes.  We, therefore, recommended  Ms. Inclan continue to follow the cancer screening guidelines provided by her primary healthcare providers.   RECOMMENDATIONS FOR FAMILY MEMBERS: Women in this family might be at some increased risk of developing cancer, over the general population risk, simply due to the family history of cancer. We recommended women in this family have a yearly mammogram beginning at age 110, or 66 years younger than the earliest onset of cancer, an annual clinical breast exam, and perform monthly breast self-exams. Women in this family should also have a gynecological exam as recommended by their primary provider. All family members should have a colonoscopy by age 19.  Based on Ms. Kau's family history, we recommended her mother, who was diagnosed with colon cancer twice at age 35, have genetic counseling and  testing. Ms. Hornung will let us know if we can be of any assistance in coordinating genetic counseling and/or testing for this family member.   FOLLOW-UP: Lastly, we discussed with Ms. Finfrock that cancer genetics is a rapidly advancing field and it is possible that new genetic tests will be appropriate for her and/or her family members in the future. We encouraged her to remain in contact with cancer genetics on an annual basis so we can update her personal and family histories and let her know of advances in cancer genetics that may benefit this family.   Our contact number was provided. Ms. Maqueda questions were answered to her satisfaction, and she knows she is welcome to call us at anytime with additional questions or concerns.   Roma Kayser, MS, [facility] Certified Genetic Counselor [email or web address]

## 2017-04-26 ENCOUNTER — Telehealth: Payer: Self-pay | Admitting: Gynecologic Oncology

## 2017-04-26 NOTE — Telephone Encounter (Signed)
Spoke with patient about genetic testing results.  Patient would like to proceed with surgery in August if possible.  She would like to have her hyst on August 21.  We will plan to keep the ovaries since no mutation were identified with genetic testing.  Pre-operative appt made for the week before surgery with Dr. Denman George.  Advised to call for any needs or concerns.

## 2017-06-25 ENCOUNTER — Ambulatory Visit: Payer: 59 | Attending: Gynecologic Oncology | Admitting: Gynecologic Oncology

## 2017-06-25 ENCOUNTER — Encounter: Payer: Self-pay | Admitting: Gynecologic Oncology

## 2017-06-25 VITALS — BP 134/72 | HR 62 | Temp 97.5°F | Resp 18 | Wt 269.0 lb

## 2017-06-25 DIAGNOSIS — Z800 Family history of malignant neoplasm of digestive organs: Secondary | ICD-10-CM | POA: Insufficient documentation

## 2017-06-25 DIAGNOSIS — Z8279 Family history of other congenital malformations, deformations and chromosomal abnormalities: Secondary | ICD-10-CM | POA: Insufficient documentation

## 2017-06-25 DIAGNOSIS — Z821 Family history of blindness and visual loss: Secondary | ICD-10-CM | POA: Diagnosis not present

## 2017-06-25 DIAGNOSIS — Z9889 Other specified postprocedural states: Secondary | ICD-10-CM | POA: Insufficient documentation

## 2017-06-25 DIAGNOSIS — F329 Major depressive disorder, single episode, unspecified: Secondary | ICD-10-CM | POA: Diagnosis not present

## 2017-06-25 DIAGNOSIS — E669 Obesity, unspecified: Secondary | ICD-10-CM | POA: Diagnosis not present

## 2017-06-25 DIAGNOSIS — Z79899 Other long term (current) drug therapy: Secondary | ICD-10-CM | POA: Diagnosis not present

## 2017-06-25 DIAGNOSIS — D069 Carcinoma in situ of cervix, unspecified: Secondary | ICD-10-CM

## 2017-06-25 DIAGNOSIS — Z888 Allergy status to other drugs, medicaments and biological substances status: Secondary | ICD-10-CM | POA: Insufficient documentation

## 2017-06-25 DIAGNOSIS — Z833 Family history of diabetes mellitus: Secondary | ICD-10-CM | POA: Insufficient documentation

## 2017-06-25 DIAGNOSIS — Z825 Family history of asthma and other chronic lower respiratory diseases: Secondary | ICD-10-CM | POA: Diagnosis not present

## 2017-06-25 DIAGNOSIS — I10 Essential (primary) hypertension: Secondary | ICD-10-CM | POA: Insufficient documentation

## 2017-06-25 NOTE — Patient Instructions (Signed)
Plan to proceed with pre-surgical testing this Wednesday.                  Preparing for your Surgery  Plan for surgery on August 21 with Dr. Everitt Amber.  You will be scheduled for a robotic assisted total hysterectomy, bilateral salpingectomy (removal of any remaining parts of fallopian tubes if any).  Pre-operative Testing -You will receive a phone call from presurgical testing at [facility] to arrange for a pre-operative testing appointment before your surgery.  This appointment normally occurs one to two weeks before your scheduled surgery.   -Bring your insurance card, copy of an advanced directive if applicable, medication list  -At that visit, you will be asked to sign a consent for a possible blood transfusion in case a transfusion becomes necessary during surgery.  The need for a blood transfusion is rare but having consent is a necessary part of your care.     -You should not be taking blood thinners or aspirin at least ten days prior to surgery unless instructed by your surgeon.  Day Before Surgery at [place] will be asked to take in a light diet the day before surgery.  Avoid carbonated beverages.  You will be advised to have nothing to eat or drink after midnight the evening before.     Eat a light diet the day before surgery.  Examples including soups, broths, toast, yogurt, mashed potatoes.  Things to avoid include carbonated beverages (fizzy beverages), raw fruits and raw vegetables, or beans.    If your bowels are filled with gas, your surgeon will have difficulty visualizing your pelvic organs which increases your surgical risks.  Your role in recovery Your role is to become active as soon as directed by your doctor, while still giving yourself time to heal.  Rest when you feel tired. You will be asked to do the following in order to speed your recovery:  - Cough and breathe deeply. This helps toclear and expand your lungs and can prevent pneumonia. You may be  given a spirometer to practice deep breathing. A staff member will show you how to use the spirometer. - Do mild physical activity. Walking or moving your legs help your circulation and body functions return to normal. A staff member will help you when you try to walk and will provide you with simple exercises. Do not try to get up or walk alone the first time. - Actively manage your pain. Managing your pain lets you move in comfort. We will ask you to rate your pain on a scale of zero to 10. It is your responsibility to tell your doctor or nurse where and how much you hurt so your pain can be treated.  Special Considerations -If you are diabetic, you may be placed on insulin after surgery to have closer control over your blood sugars to promote healing and recovery.  This does not mean that you will be discharged on insulin.  If applicable, your oral antidiabetics will be resumed when you are tolerating a solid diet.  -Your final pathology results from surgery should be available by the Friday after surgery and the results will be relayed to you when available.   Blood Transfusion Information WHAT IS A BLOOD TRANSFUSION? A transfusion is the replacement of blood or some of its parts. Blood is made up of multiple cells which provide different functions.  Red blood cells carry oxygen and are used for blood loss replacement.  White  blood cells fight against infection.  Platelets control bleeding.  Plasma helps clot blood.  Other blood products are available for specialized needs, such as hemophilia or other clotting disorders. BEFORE THE TRANSFUSION  Who gives blood for transfusions?   You may be able to donate blood to be used at a later date on yourself (autologous donation).  Relatives can be asked to donate blood. This is generally not any safer than if you have received blood from a stranger. The same precautions are taken to ensure safety when a relative's blood is donated.  Healthy  volunteers who are fully evaluated to make sure their blood is safe. This is blood bank blood. Transfusion therapy is the safest it has ever been in the practice of medicine. Before blood is taken from a donor, a complete history is taken to make sure that person has no history of diseases nor engages in risky social behavior (examples are intravenous drug use or sexual activity with multiple partners). The donor's travel history is screened to minimize risk of transmitting infections, such as malaria. The donated blood is tested for signs of infectious diseases, such as HIV and hepatitis. The blood is then tested to be sure it is compatible with you in order to minimize the chance of a transfusion reaction. If you or a relative donates blood, this is often done in anticipation of surgery and is not appropriate for emergency situations. It takes many days to process the donated blood. RISKS AND COMPLICATIONS Although transfusion therapy is very safe and saves many lives, the main dangers of transfusion include:   Getting an infectious disease.  Developing a transfusion reaction. This is an allergic reaction to something in the blood you were given. Every precaution is taken to prevent this. The decision to have a blood transfusion has been considered carefully by your caregiver before blood is given. Blood is not given unless the benefits outweigh the risks.
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
## 2017-06-26 NOTE — Patient Instructions (Signed)
Jordan Bush  06/26/2017   Your procedure is scheduled on: 07/03/17   Report to [facility] Main  Entrance Take [place]  elevators to 3rd floor to  [place] at     0800 AM.    Call this number if you have problems the morning of surgery [phone]    Remember: ONLY 1 PERSON MAY GO WITH YOU TO SHORT STAY TO GET  READY MORNING OF [place].              FOLLOW A LIGHT DIET THE DAY BEFORE SURGERY  EXAMPLES ARE : TOAST YOGURT, SOUPS, MASHED POTATOES.  AVOID: CARBONATED BEVERAGES, RAW FRUITS AND VEGGIES AND BEANS  Do not eat food or drink liquids :After Midnight.     Take these medicines the morning of surgery with A SIP OF WATER: xyzal, flonase, carvedilol, inhaler if needed ( bring)                                You may not have any metal on your body including hair pins and              piercings  Do not wear jewelry, make-up, lotions, powders or perfumes, deodorant             Do not wear nail polish.  Do not shave  48 hours prior to surgery.                Do not bring valuables to the hospital. [place].  Contacts, dentures or bridgework may not be worn into surgery.  Leave suitcase in the car. After surgery it may be brought to your room.                 Please read over the following fact sheets you were given: ____________________________________________________________________           [facility] - Preparing for Surgery Before surgery, you can play an important role.  Because skin is not sterile, your skin needs to be as free of germs as possible.  You can reduce the number of germs on your skin by washing with CHG (chlorahexidine gluconate) soap before surgery.  CHG is an antiseptic cleaner which kills germs and bonds with the skin to continue killing germs even after washing. Please DO NOT use if you have an allergy to CHG or antibacterial soaps.  If your skin becomes  reddened/irritated stop using the CHG and inform your nurse when you arrive at Short Stay. Do not shave (including legs and underarms) for at least 48 hours prior to the first CHG shower.  You may shave your face/neck. Please follow these instructions carefully:  1.  Shower with CHG Soap the night before surgery and the  morning of Surgery.  2.  If you choose to wash your hair, wash your hair first as usual with your  normal  shampoo.  3.  After you shampoo, rinse your hair and body thoroughly to remove the  shampoo.                           4.  Use CHG as you would any other liquid soap.  You  can apply chg directly  to the skin and wash                       Gently with a scrungie or clean washcloth.  5.  Apply the CHG Soap to your body ONLY FROM THE NECK DOWN.   Do not use on face/ open                           Wound or open sores. Avoid contact with eyes, ears mouth and genitals (private parts).                       Wash face,  Genitals (private parts) with your normal soap.             6.  Wash thoroughly, paying special attention to the area where your surgery  will be performed.  7.  Thoroughly rinse your body with warm water from the neck down.  8.  DO NOT shower/wash with your normal soap after using and rinsing off  the CHG Soap.                9.  Pat yourself dry with a clean towel.            10.  Wear clean pajamas.            11.  Place clean sheets on your bed the night of your first shower and do not  sleep with pets. Day of Surgery : Do not apply any lotions/deodorants the morning of surgery.  Please wear clean clothes to the hospital/surgery center.  FAILURE TO FOLLOW THESE INSTRUCTIONS MAY RESULT IN THE CANCELLATION OF YOUR SURGERY PATIENT SIGNATURE_________________________________  NURSE SIGNATURE__________________________________  ________________________________________________________________________  WHAT IS A BLOOD TRANSFUSION? Blood Transfusion Information  A  transfusion is the replacement of blood or some of its parts. Blood is made up of multiple cells which provide different functions.  Red blood cells carry oxygen and are used for blood loss replacement.  White blood cells fight against infection.  Platelets control bleeding.  Plasma helps clot blood.  Other blood products are available for specialized needs, such as hemophilia or other clotting disorders. BEFORE THE TRANSFUSION  Who gives blood for transfusions?   Healthy volunteers who are fully evaluated to make sure their blood is safe. This is blood bank blood. Transfusion therapy is the safest it has ever been in the practice of medicine. Before blood is taken from a donor, a complete history is taken to make sure that person has no history of diseases nor engages in risky social behavior (examples are intravenous drug use or sexual activity with multiple partners). The donor's travel history is screened to minimize risk of transmitting infections, such as malaria. The donated blood is tested for signs of infectious diseases, such as HIV and hepatitis. The blood is then tested to be sure it is compatible with you in order to minimize the chance of a transfusion reaction. If you or a relative donates blood, this is often done in anticipation of surgery and is not appropriate for emergency situations. It takes many days to process the donated blood. RISKS AND COMPLICATIONS Although transfusion therapy is very safe and saves many lives, the main dangers of transfusion include:   Getting an infectious disease.  Developing a transfusion reaction. This is an allergic reaction to something in the blood you were given. Every  precaution is taken to prevent this. The decision to have a blood transfusion has been considered carefully by your caregiver before blood is given. Blood is not given unless the benefits outweigh the risks. AFTER THE TRANSFUSION  Right after receiving a blood transfusion,  you will usually feel much better and more energetic. This is especially true if your red blood cells have gotten low (anemic). The transfusion raises the level of the red blood cells which carry oxygen, and this usually causes an energy increase.  The nurse administering the transfusion will monitor you carefully for complications. HOME CARE INSTRUCTIONS  No special instructions are needed after a transfusion. You may find your energy is better. Speak with your caregiver about any limitations on activity for underlying diseases you may have. SEEK MEDICAL CARE IF:   Your condition is not improving after your transfusion.  You develop redness or irritation at the intravenous (IV) site. SEEK IMMEDIATE MEDICAL CARE IF:  Any of the following symptoms occur over the next 12 hours:  Shaking chills.  You have a temperature by mouth above 102 F (38.9 C), not controlled by medicine.  Chest, back, or muscle pain.  People around you feel you are not acting correctly or are confused.  Shortness of breath or difficulty breathing.  Dizziness and fainting.  You get a rash or develop hives.  You have a decrease in urine output.  Your urine turns a dark color or changes to pink, red, or brown. Any of the following symptoms occur over the next 10 days:  You have a temperature by mouth above 102 F (38.9 C), not controlled by medicine.  Shortness of breath.  Weakness after normal activity.  The white part of the eye turns yellow (jaundice).  You have a decrease in the amount of urine or are urinating less often.  Your urine turns a dark color or changes to pink, red, or brown. Document Released: 10/27/2000 Document Revised: 01/22/2012 Document Reviewed: 06/15/2008 ExitCare Patient Information 2014 [place].  _______________________________________________________________________  Incentive Spirometer  An incentive spirometer is a tool that can help keep your lungs clear and  active. This tool measures how well you are filling your lungs with each breath. Taking long deep breaths may help reverse or decrease the chance of developing breathing (pulmonary) problems (especially infection) following:  A long period of time when you are unable to move or be active. BEFORE THE PROCEDURE   If the spirometer includes an indicator to show your best effort, your nurse or respiratory therapist will set it to a desired goal.  If possible, sit up straight or lean slightly forward. Try not to slouch.  Hold the incentive spirometer in an upright position. INSTRUCTIONS FOR USE  1. Sit on the edge of your bed if possible, or sit up as far as you can in bed or on a chair. 2. Hold the incentive spirometer in an upright position. 3. Breathe out normally. 4. Place the mouthpiece in your mouth and seal your lips tightly around it. 5. Breathe in slowly and as deeply as possible, raising the piston or the ball toward the top of the column. 6. Hold your breath for 3-5 seconds or for as long as possible. Allow the piston or ball to fall to the bottom of the column. 7. Remove the mouthpiece from your mouth and breathe out normally. 8. Rest for a few seconds and repeat Steps 1 through 7 at least 10 times every 1-2 hours when you are awake.  Take your time and take a few normal breaths between deep breaths. 9. The spirometer may include an indicator to show your best effort. Use the indicator as a goal to work toward during each repetition. 10. After each set of 10 deep breaths, practice coughing to be sure your lungs are clear. If you have an incision (the cut made at the time of surgery), support your incision when coughing by placing a pillow or rolled up towels firmly against it. Once you are able to get out of bed, walk around indoors and cough well. You may stop using the incentive spirometer when instructed by your caregiver.  RISKS AND COMPLICATIONS  Take your time so you do not get  dizzy or light-headed.  If you are in pain, you may need to take or ask for pain medication before doing incentive spirometry. It is harder to take a deep breath if you are having pain. AFTER USE  Rest and breathe slowly and easily.  It can be helpful to keep track of a log of your progress. Your caregiver can provide you with a simple table to help with this. If you are using the spirometer at home, follow these instructions: [place] IF:   You are having difficultly using the spirometer.  You have trouble using the spirometer as often as instructed.  Your pain medication is not giving enough relief while using the spirometer.  You develop fever of 100.5 F (38.1 C) or higher. SEEK IMMEDIATE MEDICAL CARE IF:   You cough up bloody sputum that had not been present before.  You develop fever of 102 F (38.9 C) or greater.  You develop worsening pain at or near the incision site. MAKE SURE YOU:   Understand these instructions.  Will watch your condition.  Will get help right away if you are not doing well or get worse. Document Released: 03/12/2007 Document Revised: 01/22/2012 Document Reviewed: 05/13/2007 [facility] Patient Information 2014 [place], [place].   ________________________________________________________________________

## 2017-06-27 ENCOUNTER — Encounter (HOSPITAL_COMMUNITY): Payer: Self-pay

## 2017-06-27 ENCOUNTER — Encounter (HOSPITAL_COMMUNITY)
Admission: RE | Admit: 2017-06-27 | Discharge: 2017-06-27 | Disposition: A | Discharge status: Home / Self Care | Payer: 59 | Source: Ambulatory Visit | Attending: Gynecologic Oncology | Admitting: Gynecologic Oncology

## 2017-06-27 DIAGNOSIS — Z01818 Encounter for other preprocedural examination: Secondary | ICD-10-CM | POA: Insufficient documentation

## 2017-06-27 DIAGNOSIS — N879 Dysplasia of cervix uteri, unspecified: Secondary | ICD-10-CM | POA: Insufficient documentation

## 2017-06-27 HISTORY — DX: Unspecified asthma, uncomplicated: J45.909

## 2017-06-27 HISTORY — DX: Anxiety disorder, unspecified: F41.9

## 2017-06-27 HISTORY — DX: Prediabetes: R73.03

## 2017-06-27 LAB — COMPREHENSIVE METABOLIC PANEL
ALT: 30 U/L (ref 14–54)
AST: 22 U/L (ref 15–41)
Albumin: 4.2 g/dL (ref 3.5–5.0)
Alkaline Phosphatase: 62 U/L (ref 38–126)
Anion gap: 9 (ref 5–15)
BILIRUBIN TOTAL: 0.5 mg/dL (ref 0.3–1.2)
BUN: 16 mg/dL (ref 6–20)
CHLORIDE: 102 mmol/L (ref 101–111)
CO2: 29 mmol/L (ref 22–32)
CREATININE: 0.97 mg/dL (ref 0.44–1.00)
Calcium: 9.3 mg/dL (ref 8.9–10.3)
Glucose, Bld: 81 mg/dL (ref 65–99)
Potassium: 4.1 mmol/L (ref 3.5–5.1)
Sodium: 140 mmol/L (ref 135–145)
TOTAL PROTEIN: 7.6 g/dL (ref 6.5–8.1)

## 2017-06-27 LAB — CBC
HEMATOCRIT: 37.1 % (ref 36.0–46.0)
Hemoglobin: 11.9 g/dL — ABNORMAL LOW (ref 12.0–15.0)
MCH: 27.7 pg (ref 26.0–34.0)
MCHC: 32.1 g/dL (ref 30.0–36.0)
MCV: 86.3 fL (ref 78.0–100.0)
PLATELETS: 280 10*3/uL (ref 150–400)
RBC: 4.3 MIL/uL (ref 3.87–5.11)
RDW: 14.2 % (ref 11.5–15.5)
WBC: 6.2 10*3/uL (ref 4.0–10.5)

## 2017-06-27 LAB — URINALYSIS, ROUTINE W REFLEX MICROSCOPIC
BILIRUBIN URINE: NEGATIVE
GLUCOSE, UA: NEGATIVE mg/dL
HGB URINE DIPSTICK: NEGATIVE
Ketones, ur: NEGATIVE mg/dL
NITRITE: NEGATIVE
PH: 5 (ref 5.0–8.0)
Protein, ur: NEGATIVE mg/dL
SPECIFIC GRAVITY, URINE: 1.02 (ref 1.005–1.030)

## 2017-06-27 LAB — ABO/RH: ABO/RH(D): O POS

## 2017-06-27 LAB — PREGNANCY, URINE: PREG TEST UR: NEGATIVE

## 2017-06-27 NOTE — Progress Notes (Signed)
UA done 06/27/17 routed to Dr. Denman George via epic

## 2017-06-27 NOTE — Progress Notes (Signed)
ekg 06/28/17 chart Clearance ov note 02/22/17 chart

## 2017-06-28 LAB — HEMOGLOBIN A1C
Hgb A1c MFr Bld: 5.6 % (ref 4.8–5.6)
MEAN PLASMA GLUCOSE: 114 mg/dL

## 2017-07-02 MED ORDER — DEXTROSE 5 % IV SOLN
3.0000 g | INTRAVENOUS | Status: AC
  Administered 2017-07-03: 3 g via INTRAVENOUS
  Filled 2017-07-02: qty 3
  Filled 2017-07-02: qty 3000

## 2017-07-03 ENCOUNTER — Telehealth: Payer: Self-pay | Admitting: *Deleted

## 2017-07-03 ENCOUNTER — Ambulatory Visit (HOSPITAL_COMMUNITY): Payer: 59 | Admitting: Anesthesiology

## 2017-07-03 ENCOUNTER — Encounter (HOSPITAL_COMMUNITY)
Admission: RE | Disposition: A | Discharge status: Home / Self Care | Payer: Self-pay | Source: Ambulatory Visit | Attending: Gynecologic Oncology

## 2017-07-03 ENCOUNTER — Ambulatory Visit (HOSPITAL_COMMUNITY)
Admission: RE | Admit: 2017-07-03 | Discharge: 2017-07-04 | Disposition: A | Discharge status: Home / Self Care | Payer: 59 | Source: Ambulatory Visit | Attending: Gynecologic Oncology | Admitting: Gynecologic Oncology

## 2017-07-03 ENCOUNTER — Encounter (HOSPITAL_COMMUNITY): Payer: Self-pay

## 2017-07-03 DIAGNOSIS — Z6841 Body Mass Index (BMI) 40.0 and over, adult: Secondary | ICD-10-CM | POA: Diagnosis not present

## 2017-07-03 DIAGNOSIS — N83201 Unspecified ovarian cyst, right side: Secondary | ICD-10-CM | POA: Diagnosis not present

## 2017-07-03 DIAGNOSIS — Z79899 Other long term (current) drug therapy: Secondary | ICD-10-CM | POA: Insufficient documentation

## 2017-07-03 DIAGNOSIS — D069 Carcinoma in situ of cervix, unspecified: Secondary | ICD-10-CM | POA: Diagnosis not present

## 2017-07-03 DIAGNOSIS — D259 Leiomyoma of uterus, unspecified: Secondary | ICD-10-CM | POA: Diagnosis not present

## 2017-07-03 DIAGNOSIS — Z7951 Long term (current) use of inhaled steroids: Secondary | ICD-10-CM | POA: Insufficient documentation

## 2017-07-03 DIAGNOSIS — J45909 Unspecified asthma, uncomplicated: Secondary | ICD-10-CM | POA: Insufficient documentation

## 2017-07-03 DIAGNOSIS — D251 Intramural leiomyoma of uterus: Secondary | ICD-10-CM

## 2017-07-03 DIAGNOSIS — N879 Dysplasia of cervix uteri, unspecified: Secondary | ICD-10-CM

## 2017-07-03 DIAGNOSIS — I10 Essential (primary) hypertension: Secondary | ICD-10-CM | POA: Diagnosis not present

## 2017-07-03 HISTORY — PX: ROBOTIC ASSISTED TOTAL HYSTERECTOMY: SHX6085

## 2017-07-03 HISTORY — PX: BILATERAL SALPINGECTOMY: SHX5743

## 2017-07-03 LAB — TYPE AND SCREEN
ABO/RH(D): O POS
Antibody Screen: NEGATIVE

## 2017-07-03 SURGERY — HYSTERECTOMY, TOTAL, ROBOT-ASSISTED
Anesthesia: General

## 2017-07-03 MED ORDER — MIDAZOLAM HCL 2 MG/2ML IJ SOLN
INTRAMUSCULAR | Status: AC
Start: 2017-07-03 — End: ?
  Filled 2017-07-03: qty 2

## 2017-07-03 MED ORDER — ONDANSETRON HCL 4 MG/2ML IJ SOLN
INTRAMUSCULAR | Status: AC
  Filled 2017-07-03: qty 2

## 2017-07-03 MED ORDER — SUCCINYLCHOLINE CHLORIDE 200 MG/10ML IV SOSY
PREFILLED_SYRINGE | INTRAVENOUS | Status: AC
Start: 2017-07-03 — End: ?
  Filled 2017-07-03: qty 10

## 2017-07-03 MED ORDER — SENNOSIDES-DOCUSATE SODIUM 8.6-50 MG PO TABS
2.0000 | ORAL_TABLET | Freq: Every day | ORAL | Status: DC
  Administered 2017-07-03: 2 via ORAL
  Filled 2017-07-03: qty 2

## 2017-07-03 MED ORDER — GABAPENTIN 300 MG PO CAPS
300.0000 mg | ORAL_CAPSULE | Freq: Once | ORAL | Status: AC
Start: 2017-07-03 — End: 2017-07-03
  Administered 2017-07-03: 300 mg via ORAL
  Filled 2017-07-03: qty 1

## 2017-07-03 MED ORDER — FENTANYL CITRATE (PF) 100 MCG/2ML IJ SOLN: 25.0000 ug | INTRAMUSCULAR | Status: DC | PRN

## 2017-07-03 MED ORDER — ENOXAPARIN SODIUM 40 MG/0.4ML ~~LOC~~ SOLN
40.0000 mg | SUBCUTANEOUS | Status: AC
  Administered 2017-07-03: 40 mg via SUBCUTANEOUS
  Filled 2017-07-03: qty 0.4

## 2017-07-03 MED ORDER — FENTANYL CITRATE (PF) 100 MCG/2ML IJ SOLN
INTRAMUSCULAR | Status: AC
  Filled 2017-07-03: qty 2

## 2017-07-03 MED ORDER — GABAPENTIN 300 MG PO CAPS
600.0000 mg | ORAL_CAPSULE | Freq: Every day | ORAL | Status: AC
  Administered 2017-07-03: 600 mg via ORAL
  Filled 2017-07-03: qty 2

## 2017-07-03 MED ORDER — PROPOFOL 10 MG/ML IV BOLUS
INTRAVENOUS | Status: AC
  Filled 2017-07-03: qty 20

## 2017-07-03 MED ORDER — OXYCODONE-ACETAMINOPHEN 5-325 MG PO TABS
1.0000 | ORAL_TABLET | ORAL | Status: DC | PRN
  Administered 2017-07-04 (×2): 2 via ORAL
  Filled 2017-07-03 (×2): qty 2

## 2017-07-03 MED ORDER — SUCCINYLCHOLINE CHLORIDE 200 MG/10ML IV SOSY
PREFILLED_SYRINGE | INTRAVENOUS | Status: DC | PRN
  Administered 2017-07-03: 140 mg via INTRAVENOUS

## 2017-07-03 MED ORDER — LIDOCAINE 2% (20 MG/ML) 5 ML SYRINGE
INTRAMUSCULAR | Status: DC | PRN
Start: 2017-07-03 — End: 2017-07-03
  Administered 2017-07-03 (×2): 100 mg via INTRAVENOUS

## 2017-07-03 MED ORDER — ONDANSETRON HCL 4 MG PO TABS: 4.0000 mg | ORAL_TABLET | Freq: Four times a day (QID) | ORAL | Status: DC | PRN

## 2017-07-03 MED ORDER — SUGAMMADEX SODIUM 500 MG/5ML IV SOLN
INTRAVENOUS | Status: AC
  Filled 2017-07-03: qty 5

## 2017-07-03 MED ORDER — LACTATED RINGERS IR SOLN
Status: DC | PRN
  Administered 2017-07-03: 1000 mL

## 2017-07-03 MED ORDER — MIDAZOLAM HCL 5 MG/5ML IJ SOLN
INTRAMUSCULAR | Status: DC | PRN
  Administered 2017-07-03: 2 mg via INTRAVENOUS

## 2017-07-03 MED ORDER — ONDANSETRON HCL 4 MG/2ML IJ SOLN
INTRAMUSCULAR | Status: DC | PRN
  Administered 2017-07-03: 4 mg via INTRAVENOUS

## 2017-07-03 MED ORDER — ENOXAPARIN SODIUM 40 MG/0.4ML ~~LOC~~ SOLN
40.0000 mg | SUBCUTANEOUS | Status: DC
  Administered 2017-07-04: 40 mg via SUBCUTANEOUS
  Filled 2017-07-03: qty 0.4

## 2017-07-03 MED ORDER — MONTELUKAST SODIUM 10 MG PO TABS
10.0000 mg | ORAL_TABLET | Freq: Every day | ORAL | Status: DC
  Administered 2017-07-03: 10 mg via ORAL
  Filled 2017-07-03: qty 1

## 2017-07-03 MED ORDER — CELECOXIB 200 MG PO CAPS
400.0000 mg | ORAL_CAPSULE | Freq: Once | ORAL | Status: AC
  Administered 2017-07-03: 400 mg via ORAL
  Filled 2017-07-03: qty 2

## 2017-07-03 MED ORDER — DEXAMETHASONE SODIUM PHOSPHATE 10 MG/ML IJ SOLN
INTRAMUSCULAR | Status: AC
Start: 2017-07-03 — End: ?
  Filled 2017-07-03: qty 1

## 2017-07-03 MED ORDER — FENTANYL CITRATE (PF) 100 MCG/2ML IJ SOLN
INTRAMUSCULAR | Status: DC | PRN
  Administered 2017-07-03: 50 ug via INTRAVENOUS
  Administered 2017-07-03: 75 ug via INTRAVENOUS
  Administered 2017-07-03: 50 ug via INTRAVENOUS
  Administered 2017-07-03: 25 ug via INTRAVENOUS
  Administered 2017-07-03: 50 ug via INTRAVENOUS

## 2017-07-03 MED ORDER — LACTATED RINGERS IV SOLN
INTRAVENOUS | Status: DC
  Administered 2017-07-03: 09:00:00 via INTRAVENOUS

## 2017-07-03 MED ORDER — DICLOFENAC SODIUM 50 MG PO TBEC
50.0000 mg | DELAYED_RELEASE_TABLET | Freq: Four times a day (QID) | ORAL | Status: DC
  Administered 2017-07-03 – 2017-07-04 (×3): 50 mg via ORAL
  Filled 2017-07-03 (×4): qty 1

## 2017-07-03 MED ORDER — STERILE WATER FOR IRRIGATION IR SOLN
Status: DC | PRN
  Administered 2017-07-03: 1000 mL

## 2017-07-03 MED ORDER — PROMETHAZINE HCL 25 MG/ML IJ SOLN: 6.2500 mg | INTRAMUSCULAR | Status: DC | PRN

## 2017-07-03 MED ORDER — ALBUTEROL SULFATE (2.5 MG/3ML) 0.083% IN NEBU: 3.0000 mL | INHALATION_SOLUTION | Freq: Four times a day (QID) | RESPIRATORY_TRACT | Status: DC | PRN

## 2017-07-03 MED ORDER — ONDANSETRON HCL 4 MG/2ML IJ SOLN: 4.0000 mg | Freq: Four times a day (QID) | INTRAMUSCULAR | Status: DC | PRN

## 2017-07-03 MED ORDER — HYDROMORPHONE HCL-NACL 0.5-0.9 MG/ML-% IV SOSY
0.2000 mg | PREFILLED_SYRINGE | INTRAVENOUS | Status: DC | PRN
  Administered 2017-07-03: 0.5 mg via INTRAVENOUS
  Filled 2017-07-03: qty 1

## 2017-07-03 MED ORDER — CARVEDILOL 3.125 MG PO TABS
3.1250 mg | ORAL_TABLET | Freq: Two times a day (BID) | ORAL | Status: DC
  Administered 2017-07-04: 3.125 mg via ORAL
  Filled 2017-07-03: qty 1

## 2017-07-03 MED ORDER — ACETAMINOPHEN 500 MG PO TABS
1000.0000 mg | ORAL_TABLET | Freq: Once | ORAL | Status: AC
  Administered 2017-07-03: 1000 mg via ORAL
  Filled 2017-07-03: qty 2

## 2017-07-03 MED ORDER — LIDOCAINE 2% (20 MG/ML) 5 ML SYRINGE
INTRAMUSCULAR | Status: AC
Start: 2017-07-03 — End: ?
  Filled 2017-07-03: qty 5

## 2017-07-03 MED ORDER — PROPOFOL 10 MG/ML IV BOLUS
INTRAVENOUS | Status: DC | PRN
  Administered 2017-07-03: 200 mg via INTRAVENOUS

## 2017-07-03 MED ORDER — SUGAMMADEX SODIUM 500 MG/5ML IV SOLN
INTRAVENOUS | Status: DC | PRN
  Administered 2017-07-03: 300 mg via INTRAVENOUS

## 2017-07-03 MED ORDER — DEXAMETHASONE SODIUM PHOSPHATE 10 MG/ML IJ SOLN
INTRAMUSCULAR | Status: DC | PRN
  Administered 2017-07-03: 10 mg via INTRAVENOUS

## 2017-07-03 MED ORDER — ROCURONIUM BROMIDE 10 MG/ML (PF) SYRINGE
PREFILLED_SYRINGE | INTRAVENOUS | Status: DC | PRN
  Administered 2017-07-03: 50 mg via INTRAVENOUS

## 2017-07-03 MED ORDER — KCL IN DEXTROSE-NACL 20-5-0.45 MEQ/L-%-% IV SOLN
INTRAVENOUS | Status: DC
  Filled 2017-07-03: qty 1000

## 2017-07-03 MED ORDER — SUGAMMADEX SODIUM 500 MG/5ML IV SOLN
INTRAVENOUS | Status: AC
Start: 2017-07-03 — End: ?
  Filled 2017-07-03: qty 5

## 2017-07-03 MED ORDER — ROCURONIUM BROMIDE 50 MG/5ML IV SOSY
PREFILLED_SYRINGE | INTRAVENOUS | Status: AC
Start: 2017-07-03 — End: ?
  Filled 2017-07-03: qty 5

## 2017-07-03 SURGICAL SUPPLY — 42 items
APPLICATOR SURGIFLO ENDO (HEMOSTASIS) IMPLANT
BAG LAPAROSCOPIC 12 15 PORT 16 (BASKET) IMPLANT
BAG RETRIEVAL 12/15 (BASKET)
BAG RETRIEVAL 12/15MM (BASKET)
COVER BACK TABLE 60X90IN (DRAPES) ×4 IMPLANT
COVER TIP SHEARS 8 DVNC (MISCELLANEOUS) ×2 IMPLANT
COVER TIP SHEARS 8MM DA VINCI (MISCELLANEOUS) ×2
DRAPE ARM DVNC X/XI (DISPOSABLE) ×8 IMPLANT
DRAPE COLUMN DVNC XI (DISPOSABLE) ×2 IMPLANT
DRAPE DA VINCI XI ARM (DISPOSABLE) ×8
DRAPE DA VINCI XI COLUMN (DISPOSABLE) ×2
DRAPE SHEET LG 3/4 BI-LAMINATE (DRAPES) ×4 IMPLANT
DRAPE SURG IRRIG POUCH 19X23 (DRAPES) ×4 IMPLANT
ELECT REM PT RETURN 15FT ADLT (MISCELLANEOUS) ×4 IMPLANT
GLOVE BIO SURGEON STRL SZ 6 (GLOVE) ×16 IMPLANT
GLOVE BIO SURGEON STRL SZ 6.5 (GLOVE) ×6 IMPLANT
GLOVE BIO SURGEONS STRL SZ 6.5 (GLOVE) ×2
GOWN STRL REUS W/ TWL LRG LVL3 (GOWN DISPOSABLE) ×4 IMPLANT
GOWN STRL REUS W/TWL LRG LVL3 (GOWN DISPOSABLE) ×4
HOLDER FOLEY CATH W/STRAP (MISCELLANEOUS) ×4 IMPLANT
IRRIG SUCT STRYKERFLOW 2 WTIP (MISCELLANEOUS) ×4
IRRIGATION SUCT STRKRFLW 2 WTP (MISCELLANEOUS) ×2 IMPLANT
MANIPULATOR UTERINE 4.5 ZUMI (MISCELLANEOUS) ×4 IMPLANT
OBTURATOR OPTICAL STANDARD 8MM (TROCAR) ×2
OBTURATOR OPTICAL STND 8 DVNC (TROCAR) ×2
OBTURATOR OPTICALSTD 8 DVNC (TROCAR) ×2 IMPLANT
PACK ROBOT GYN CUSTOM WL (TRAY / TRAY PROCEDURE) ×4 IMPLANT
PAD POSITIONING PINK XL (MISCELLANEOUS) ×4 IMPLANT
POUCH SPECIMEN RETRIEVAL 10MM (ENDOMECHANICALS) IMPLANT
SEAL CANN UNIV 5-8 DVNC XI (MISCELLANEOUS) ×8 IMPLANT
SEAL XI 5MM-8MM UNIVERSAL (MISCELLANEOUS) ×8
SET TRI-LUMEN FLTR TB AIRSEAL (TUBING) ×4 IMPLANT
SOLUTION ELECTROLUBE (MISCELLANEOUS) ×4 IMPLANT
SURGIFLO W/THROMBIN 8M KIT (HEMOSTASIS) IMPLANT
SUT PROLENE 5 0 CC 1 (SUTURE) IMPLANT
SUT VIC AB 0 CT1 27 (SUTURE) ×2
SUT VIC AB 0 CT1 27XBRD ANTBC (SUTURE) ×2 IMPLANT
TOWEL OR NON WOVEN STRL DISP B (DISPOSABLE) ×4 IMPLANT
TRAP SPECIMEN MUCOUS 40CC (MISCELLANEOUS) IMPLANT
TRAY FOLEY W/METER SILVER 16FR (SET/KITS/TRAYS/PACK) ×4 IMPLANT
UNDERPAD 30X30 (UNDERPADS AND DIAPERS) ×4 IMPLANT
WATER STERILE IRR 1000ML POUR (IV SOLUTION) ×4 IMPLANT

## 2017-07-03 NOTE — Interval H&P Note (Signed)
History and Physical Interval Note:  07/03/2017 9:56 AM  Jordan Bush  has presented today for surgery, with the diagnosis of CIN3 CERVICLE DYSPLASIA  The various methods of treatment have been discussed with the patient and family. After consideration of risks, benefits and other options for treatment, the patient has consented to  Procedure(s): XI ROBOTIC ASSISTED TOTAL HYSTERECTOMY (N/A) BILATERAL SALPINGECTOMY (Bilateral) as a surgical intervention .  The patient's history has been reviewed, patient examined, no change in status, stable for surgery.  I have reviewed the patient's chart and labs.  Questions were answered to the patient's satisfaction.     Donaciano Eva

## 2017-07-03 NOTE — Transfer of Care (Signed)
Immediate Anesthesia Transfer of Care Note  Patient: Zenith Kercheval Darley  Procedure(s) Performed: Procedure(s): XI ROBOTIC ASSISTED TOTAL HYSTERECTOMY (N/A) BILATERAL SALPINGECTOMY, RIGHT OVARIAN CYSTECTOMY (Bilateral)  Patient Location: PACU  Anesthesia Type:General  Level of Consciousness: sedated  Airway & Oxygen Therapy: Patient Spontanous Breathing and Patient connected to face mask oxygen  Post-op Assessment: Report given to RN and Post -op Vital signs reviewed and stable  Post vital signs: Reviewed  Last Vitals:  Vitals:   07/03/17 0901  BP: 117/82  Pulse: 72  Resp: 18  Temp: 36.9 C  SpO2: 98%    Last Pain:  Vitals:   07/03/17 0951  PainSc: 0-No pain      Patients Stated Pain Goal: 4 (60/15/61 [id number])  Complications: No apparent anesthesia complications

## 2017-07-03 NOTE — Anesthesia Postprocedure Evaluation (Signed)
Anesthesia Post Note  Patient: Jordan Bush  Procedure(s) Performed: Procedure(s) (LRB): XI ROBOTIC ASSISTED TOTAL HYSTERECTOMY (N/A) BILATERAL SALPINGECTOMY, RIGHT OVARIAN CYSTECTOMY (Bilateral)     Patient location during evaluation: PACU Anesthesia Type: General Level of consciousness: awake and alert Pain management: pain level controlled Vital Signs Assessment: post-procedure vital signs reviewed and stable Respiratory status: spontaneous breathing, nonlabored ventilation and respiratory function stable Cardiovascular status: blood pressure returned to baseline and stable Postop Assessment: no signs of nausea or vomiting Anesthetic complications: no    Last Vitals:  Vitals:   07/03/17 1526 07/03/17 1618  BP: 122/76 120/72  Pulse: 78 78  Resp: 16 18  Temp: 36.7 C 37 C  SpO2: 98% 100%    Last Pain:  Vitals:   07/03/17 1618  TempSrc: Oral  PainSc:                  Catalina Gravel

## 2017-07-03 NOTE — Telephone Encounter (Signed)
Scheduled the patient for a post op appt. Patient to receive appt with her discharge summary
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
## 2017-07-03 NOTE — Op Note (Addendum)
OPERATIVE NOTE 07/03/17  Surgeon: Donaciano Eva   Assistants: Dr Lahoma Crocker (an MD assistant was necessary for tissue manipulation, management of robotic instrumentation, retraction and positioning due to the complexity of the case and hospital policies).   Anesthesia: General endotracheal anesthesia  ASA Class: 3   Pre-operative Diagnosis: fibroids and cervical adenocarcinoma in situ  Post-operative Diagnosis: same, right ovarian cyst  Operation: Robotic-assisted laparoscopic total hysterectomy with bilateral salpingectomy, right ovarian cystectomy  Surgeon: Donaciano Eva  Assistant Surgeon: Lahoma Crocker MD  Anesthesia: GET  Urine Output: 300cc  Operative Findings:  : 14cm bulky fibroid (intramural) uterus, normal appearing uterus, right ovarian cyst (3cm, simple)  Estimated Blood Loss:  <25cc      Total IV Fluids: 500 ml         Specimens: right ovarian cyst, uterus and cervix with bilateral tubes         Complications:  None; patient tolerated the procedure well.         Disposition: PACU - hemodynamically stable.  Procedure Details  The patient was seen in the Holding Room. The risks, benefits, complications, treatment options, and expected outcomes were discussed with the patient.  The patient concurred with the proposed plan, giving informed consent.  The site of surgery properly noted/marked. The patient was identified as Jordan Bush and the procedure verified as a Robotic-assisted hysterectomy with bilateral salpingectomy. A Time Out was held and the above information confirmed.  After induction of anesthesia, the patient was draped and prepped in the usual sterile manner. Pt was placed in supine position after anesthesia and draped and prepped in the usual sterile manner. The abdominal drape was placed after the CholoraPrep had been allowed to dry for 3 minutes.  Her arms were tucked to her side with all appropriate precautions.  The  shoulders were stabilized with padded shoulder blocks applied to the acromium processes.  The patient was placed in the semi-lithotomy position in [place].  The perineum was prepped with Betadine. The patient was then prepped. Foley catheter was placed.  A sterile speculum was placed in the vagina.  The cervix was grasped with a single-tooth tenaculum and dilated with Kennon Rounds dilators.  The ZUMI uterine manipulator with a medium colpotomizer ring was placed without difficulty.  A pneum occluder balloon was placed over the manipulator.  OG tube placement was confirmed and to suction.   Next, a 5 mm skin incision was made 1 cm below the subcostal margin in the midclavicular line.  The 5 mm Optiview port and scope was used for direct entry.  Opening pressure was under 10 mm CO2.  The abdomen was insufflated and the findings were noted as above.   At this point and all points during the procedure, the patient's intra-abdominal pressure did not exceed 15 mmHg. Next, a 10 mm skin incision was made 2cm above the umbilicus and a right and left port was placed about 10 cm lateral to the robot port on the right and left side.  A fourth arm was placed in the left lower quadrant 2 cm above and superior and medial to the anterior superior iliac spine.  All ports were placed under direct visualization.  The patient was placed in steep Trendelenburg.  Bowel was folded away into the upper abdomen.  The robot was docked in the normal manner.  A cyst was identified on the right ovary, it appeared benign. The ovarian cortex was scored and the underlying cyst was grasped and teased  from its ovarian attachments and sent for pathology. The bed was made hemostatic with bipolar.  The hysterectomy was started after the round ligament on the right side was incised and the retroperitoneum was entered and the pararectal space was developed.  The ureter was noted to be on the medial leaf of the broad ligament.  The peritoneum above  the ureter was incised and stretched and the utero-ovarian ligament was skeletonized, cauterized and cut.  The posterior peritoneum was taken down to the level of the KOH ring.  The anterior peritoneum was also taken down.  The bladder flap was created to the level of the KOH ring.  The uterine artery on the right side was skeletonized, cauterized and cut in the normal manner.  A similar procedure was performed on the left.  The colpotomy was made and the uterus, cervix, bilateral tubes were amputated and delivered through the vagina.  Pedicles were inspected and excellent hemostasis was achieved.    The colpotomy at the vaginal cuff was closed with Vicryl on a CT1 needle in a figure of 8 manner.  Irrigation was used and excellent hemostasis was achieved.  At this point in the procedure was completed.  Robotic instruments were removed under direct visulaization.  The robot was undocked. The 10 mm ports were closed with Vicryl on a UR-5 needle and the fascia was closed with 0 Vicryl on a UR-5 needle.  The skin was closed with 4-0 Vicryl in a subcuticular manner.  Dermabond was applied.  Sponge, lap and needle counts correct x 2.  The patient was taken to the recovery room in stable condition.  The vagina was swabbed with  minimal bleeding noted.   All instrument and needle counts were correct x  3.   The patient was transferred to the recovery room in stable condition.  Donaciano Eva, MD

## 2017-07-03 NOTE — Anesthesia Procedure Notes (Signed)
Procedure Name: Intubation Date/Time: 07/03/2017 10:37 AM Performed by: Lind Covert Pre-anesthesia Checklist: Patient identified, Emergency Drugs available, Suction available and Patient being monitored Patient Re-evaluated:Patient Re-evaluated prior to induction Oxygen Delivery Method: Circle system utilized Preoxygenation: Pre-oxygenation with 100% oxygen Induction Type: IV induction Ventilation: Mask ventilation without difficulty Laryngoscope Size: Mac and 4 Grade View: Grade II Tube type: Oral Tube size: 7.0 mm Number of attempts: 2 Airway Equipment and Method: Stylet Placement Confirmation: ETT inserted through vocal cords under direct vision,  positive ETCO2 and breath sounds checked- equal and bilateral Secured at: 21 cm Tube secured with: Tape Dental Injury: Teeth and Oropharynx as per pre-operative assessment

## 2017-07-03 NOTE — Anesthesia Preprocedure Evaluation (Addendum)
Anesthesia Evaluation  Patient identified by MRN, date of birth, ID band Patient awake    Reviewed: Allergy & Precautions, NPO status , Patient's Chart, lab work & pertinent test results, reviewed documented beta blocker date and time   Airway Mallampati: III  TM Distance: >3 FB Neck ROM: Full    Dental  (+) Teeth Intact, Dental Advisory Given   Pulmonary asthma ,    Pulmonary exam normal breath sounds clear to auscultation       Cardiovascular hypertension, Pt. on home beta blockers and Pt. on medications Normal cardiovascular exam Rhythm:Regular Rate:Normal     Neuro/Psych PSYCHIATRIC DISORDERS Anxiety Depression negative neurological ROS     GI/Hepatic negative GI ROS, Neg liver ROS,   Endo/Other  Morbid obesity  Renal/GU negative Renal ROS     Musculoskeletal negative musculoskeletal ROS (+)   Abdominal   Peds  Hematology  (+) Blood dyscrasia, anemia ,   Anesthesia Other Findings Day of surgery medications reviewed with the patient.  Reproductive/Obstetrics                            Anesthesia Physical Anesthesia Plan  ASA: III  Anesthesia Plan: General   Post-op Pain Management:    Induction: Intravenous  PONV Risk Score and Plan: 3 and Ondansetron, Dexamethasone, Midazolam and Scopolamine patch - Pre-op  Airway Management Planned: Oral ETT  Additional Equipment:   Intra-op Plan:   Post-operative Plan: Extubation in OR  Informed Consent: I have reviewed the patients History and Physical, chart, labs and discussed the procedure including the risks, benefits and alternatives for the proposed anesthesia with the patient or authorized representative who has indicated his/her understanding and acceptance.   Dental advisory given  Plan Discussed with: CRNA  Anesthesia Plan Comments: (Risks/benefits of general anesthesia discussed with patient including risk of damage to  teeth, lips, gum, and tongue, nausea/vomiting, allergic reactions to medications, and the possibility of heart attack, stroke and death.  All patient questions answered.  Patient wishes to proceed.)        Anesthesia Quick Evaluation

## 2017-07-04 DIAGNOSIS — D069 Carcinoma in situ of cervix, unspecified: Secondary | ICD-10-CM | POA: Diagnosis not present

## 2017-07-04 LAB — BASIC METABOLIC PANEL
Anion gap: 7 (ref 5–15)
BUN: 13 mg/dL (ref 6–20)
CHLORIDE: 104 mmol/L (ref 101–111)
CO2: 27 mmol/L (ref 22–32)
Calcium: 8.8 mg/dL — ABNORMAL LOW (ref 8.9–10.3)
Creatinine, Ser: 1.06 mg/dL — ABNORMAL HIGH (ref 0.44–1.00)
GFR calc Af Amer: >60 mL/min (ref >60)
GLUCOSE: 145 mg/dL — AB (ref 65–99)
POTASSIUM: 4 mmol/L (ref 3.5–5.1)
Sodium: 138 mmol/L (ref 135–145)

## 2017-07-04 LAB — CBC
HCT: 33.3 % — ABNORMAL LOW (ref 36.0–46.0)
HEMOGLOBIN: 10.9 g/dL — AB (ref 12.0–15.0)
MCH: 27.7 pg (ref 26.0–34.0)
MCHC: 32.7 g/dL (ref 30.0–36.0)
MCV: 84.5 fL (ref 78.0–100.0)
Platelets: 249 10*3/uL (ref 150–400)
RBC: 3.94 MIL/uL (ref 3.87–5.11)
RDW: 14.2 % (ref 11.5–15.5)
WBC: 16.4 10*3/uL — ABNORMAL HIGH (ref 4.0–10.5)

## 2017-07-04 MED ORDER — OXYCODONE-ACETAMINOPHEN 5-325 MG PO TABS: 1.0000 | ORAL_TABLET | ORAL | 0 refills | Status: DC | PRN

## 2017-07-04 NOTE — Discharge Summary (Signed)
Physician Discharge Summary  Patient ID: Jordan Bush MRN: [id number] DOB/AGE: 05-09-1971 46 y.o.  Admit date: 07/03/2017 Discharge date: 07/04/2017  Admission Diagnoses: Adenocarcinoma in situ of cervix  Discharge Diagnoses:  Principal Problem:   Adenocarcinoma in situ of cervix Active Problems:   Adenocarcinoma in situ (AIS) of uterine cervix   Discharged Condition:  The patient is in good condition and stable for discharge.    Hospital Course: On 07/03/2017, the patient underwent the following: Procedure(s): XI ROBOTIC ASSISTED TOTAL HYSTERECTOMY BILATERAL SALPINGECTOMY, RIGHT OVARIAN CYSTECTOMY.   The postoperative course was uneventful.  She was discharged to home on postoperative day 1 tolerating a regular diet, ambulating, voiding, pain controlled.  Consults: None  Significant Diagnostic Studies: None  Treatments: surgery: see above  Discharge Exam: Blood pressure 111/68, pulse 93, temperature 97.6 F (36.4 C), temperature source Oral, resp. rate 18, height 5\' 2"  (1.575 m), weight 262 lb (118.8 kg), SpO2 99 %. General appearance: alert, cooperative, appears stated age and no distress Resp: clear to auscultation bilaterally Cardio: regular rate and rhythm, S1, S2 normal, no murmur, click, rub or gallop GI: soft, non-tender; bowel sounds normal; no masses,  no organomegaly Extremities: extremities normal, atraumatic, no cyanosis or edema Incision/Wound: Lap sites to the abdomen with dermabond without erythema or drainage  Disposition: 01-Home or Self Care  Discharge Instructions    Call MD for:  difficulty breathing, headache or visual disturbances    Complete by:  As directed    Call MD for:  extreme fatigue    Complete by:  As directed    Call MD for:  hives    Complete by:  As directed    Call MD for:  persistant dizziness or light-headedness    Complete by:  As directed    Call MD for:  persistant nausea and vomiting    Complete by:  As directed    Call MD  for:  redness, tenderness, or signs of infection (pain, swelling, redness, odor or green/yellow discharge around incision site)    Complete by:  As directed    Call MD for:  severe uncontrolled pain    Complete by:  As directed    Call MD for:  temperature >100.4    Complete by:  As directed    Diet - low sodium heart healthy    Complete by:  As directed    Driving Restrictions    Complete by:  As directed    No driving for 1 week.  Do not take narcotics and drive.   Increase activity slowly    Complete by:  As directed    Lifting restrictions    Complete by:  As directed    No lifting greater than 10 lbs.   Sexual Activity Restrictions    Complete by:  As directed    No sexual activity, nothing in the vagina, for 8 weeks.     Allergies as of 07/04/2017      Reactions   Ace Inhibitors Swelling   Lisinopril Swelling   Facial swelling    Other    Trees, grass, bed bugs Trees, grass, bed bugs      Medication List    TAKE these medications   acetaminophen 325 MG tablet Commonly known as:  TYLENOL Take 650 mg by mouth every 6 (six) hours as needed for mild pain or headache.   albuterol 108 (90 Base) MCG/ACT inhaler Commonly known as:  PROVENTIL HFA;VENTOLIN HFA Inhale 1-2 puffs into the lungs every  6 (six) hours as needed for wheezing or shortness of breath.   carvedilol 3.125 MG tablet Commonly known as:  COREG TAKE 1 TABLET BY MOUTH TWICE DAILY   EPINEPHrine 0.3 mg/0.3 mL Soaj injection Commonly known as:  EPIPEN 2-PAK Inject 0.3 mLs (0.3 mg total) into the muscle once.   fluticasone 50 MCG/ACT nasal spray Commonly known as:  FLONASE Place 2 sprays into both nostrils daily.   hydrochlorothiazide 12.5 MG tablet Commonly known as:  HYDRODIURIL Take 12.5 mg by mouth daily.   levocetirizine 5 MG tablet Commonly known as:  XYZAL Take 1 tablet (5 mg total) by mouth every evening. What changed:  when to take this   montelukast 10 MG tablet Commonly known as:   SINGULAIR Take 1 tablet (10 mg total) by mouth at bedtime.   multivitamin tablet Take 1 tablet by mouth daily.   oxyCODONE-acetaminophen 5-325 MG tablet Commonly known as:  PERCOCET/ROXICET Take 1-2 tablets by mouth every 4 (four) hours as needed (moderate to severe pain).            Discharge Care Instructions        Start     Ordered   07/04/17 0000  oxyCODONE-acetaminophen (PERCOCET/ROXICET) 5-325 MG tablet  Every 4 hours PRN     07/04/17 0914   07/04/17 0000  Increase activity slowly     07/04/17 0914   07/04/17 0000  Driving Restrictions    Comments:  No driving for 1 week.  Do not take narcotics and drive.   07/04/17 0914   07/04/17 0000  Lifting restrictions    Comments:  No lifting greater than 10 lbs.   07/04/17 0914   07/04/17 0000  Sexual Activity Restrictions    Comments:  No sexual activity, nothing in the vagina, for 8 weeks.   07/04/17 0914   07/04/17 0000  Diet - low sodium heart healthy     07/04/17 0914   07/04/17 0000  Call MD for:  temperature >100.4     07/04/17 0914   07/04/17 0000  Call MD for:  persistant nausea and vomiting     07/04/17 0914   07/04/17 0000  Call MD for:  severe uncontrolled pain     07/04/17 0914   07/04/17 0000  Call MD for:  redness, tenderness, or signs of infection (pain, swelling, redness, odor or green/yellow discharge around incision site)     07/04/17 0914   07/04/17 0000  Call MD for:  difficulty breathing, headache or visual disturbances     07/04/17 0914   07/04/17 0000  Call MD for:  hives     07/04/17 0914   07/04/17 0000  Call MD for:  persistant dizziness or light-headedness     07/04/17 0914   07/04/17 0000  Call MD for:  extreme fatigue     07/04/17 0914       Greater than thirty minutes were spend for face to face discharge instructions and discharge orders/summary in EPIC.   Signed: CROSS, MELISSA DEAL 07/04/2017, 10:02 AM

## 2017-07-04 NOTE — Progress Notes (Signed)
Pt voided, pain controlled with meds, no c/o N/V. Pt discharged with paper rx, all discharge education complete. Pt sent with all personal belongings.

## 2017-07-04 NOTE — Discharge Instructions (Signed)
07/03/2017  Return to work: 4 weeks  Activity: 1. Be up and out of the bed during the day.  Take a nap if needed.  You may walk up steps but be careful and use the hand rail.  Stair climbing will tire you more than you think, you may need to stop part way and rest.   2. No lifting or straining for 6 weeks.  3. No driving for 1 weeks.  Do Not drive if you are taking narcotic pain medicine.  4. Shower daily.  Use soap and water on your incision and pat dry; don't rub.   5. No sexual activity and nothing in the vagina for 8 weeks.  Medications:  - Take ibuprofen and tylenol first line for pain control. Take these regularly (every 6 hours) to decrease the build up of pain.  - If necessary, for severe pain not relieved by ibuprofen, take percocet.  - While taking percocet you should take sennakot every night to reduce the likelihood of constipation. If this causes diarrhea, stop its use.  Diet: 1. Low sodium Heart Healthy Diet is recommended.  2. It is safe to use a laxative if you have difficulty moving your bowels.   Wound Care: 1. Keep clean and dry.  Shower daily.  Reasons to call the Doctor:   Fever - Oral temperature greater than 100.4 degrees Fahrenheit  Foul-smelling vaginal discharge  Difficulty urinating  Nausea and vomiting  Increased pain at the site of the incision that is unrelieved with pain medicine.  Difficulty breathing with or without chest pain  New calf pain especially if only on one side  Sudden, continuing increased vaginal bleeding with or without clots.   Follow-up: 1. See Everitt Amber in 4 weeks.  Contacts: For questions or concerns you should contact:  Dr. Everitt Amber at [phone] After hours and on week-ends call [phone] and ask to speak to the physician on call for Gynecologic Oncology  Acetaminophen; Oxycodone tablets What is this medicine? ACETAMINOPHEN; OXYCODONE (a set a MEE noe fen; ox i KOE done) is a pain reliever. It is  used to treat moderate to severe pain. This medicine may be used for other purposes; ask your health care provider or pharmacist if you have questions. COMMON BRAND NAME(S): Endocet, Magnacet, Narvox, Percocet, Perloxx, Primalev, Primlev, Roxicet, Xolox What should I tell my health care provider before I take this medicine? They need to know if you have any of these conditions: -brain tumor -Crohn's disease, inflammatory bowel disease, or ulcerative colitis -drug abuse or addiction -head injury -heart or circulation problems -if you often drink alcohol -kidney disease or problems going to the bathroom -liver disease -lung disease, asthma, or breathing problems -an unusual or allergic reaction to acetaminophen, oxycodone, other opioid analgesics, other medicines, foods, dyes, or preservatives -pregnant or trying to get pregnant -breast-feeding How should I use this medicine? Take this medicine by mouth with a full glass of water. Follow the directions on the prescription label. You can take it with or without food. If it upsets your stomach, take it with food. Take your medicine at regular intervals. Do not take it more often than directed. A special MedGuide will be given to you by the pharmacist with each prescription and refill. Be sure to read this information carefully each time. Talk to your pediatrician regarding the use of this medicine in children. Special care may be needed. Overdosage: If you think you have taken too much of this medicine contact  a poison control center or emergency room at once. NOTE: This medicine is only for you. Do not share this medicine with others. What if I miss a dose? If you miss a dose, take it as soon as you can. If it is almost time for your next dose, take only that dose. Do not take double or extra doses. What may interact with this medicine? This medicine may interact with the following medications: -alcohol -antihistamines for allergy, cough and  cold -antiviral medicines for HIV or AIDS -atropine -certain antibiotics like clarithromycin, erythromycin, linezolid, rifampin -certain medicines for anxiety or sleep -certain medicines for bladder problems like oxybutynin, tolterodine -certain medicines for depression like amitriptyline, fluoxetine, sertraline -certain medicines for fungal infections like ketoconazole, itraconazole, voriconazole -certain medicines for migraine headache like almotriptan, eletriptan, frovatriptan, naratriptan, rizatriptan, sumatriptan, zolmitriptan -certain medicines for nausea or vomiting like dolasetron, ondansetron, palonosetron -certain medicines for Parkinson's disease like benztropine, trihexyphenidyl -certain medicines for seizures like phenobarbital, phenytoin, primidone -certain medicines for stomach problems like dicyclomine, hyoscyamine -certain medicines for travel sickness like scopolamine -diuretics -general anesthetics like halothane, isoflurane, methoxyflurane, propofol -ipratropium -local anesthetics like lidocaine, pramoxine, tetracaine -MAOIs like Carbex, Eldepryl, Marplan, Nardil, and Parnate -medicines that relax muscles for surgery -methylene blue -nilotinib -other medicines with acetaminophen -other narcotic medicines for pain or cough -phenothiazines like chlorpromazine, mesoridazine, prochlorperazine, thioridazine This list may not describe all possible interactions. Give your health care provider a list of all the medicines, herbs, non-prescription drugs, or dietary supplements you use. Also tell them if you smoke, drink alcohol, or use illegal drugs. Some items may interact with your medicine. What should I watch for while using this medicine? Tell your doctor or health care professional if your pain does not go away, if it gets worse, or if you have new or a different type of pain. You may develop tolerance to the medicine. Tolerance means that you will need a higher dose of the  medication for pain relief. Tolerance is normal and is expected if you take this medicine for a long time. Do not suddenly stop taking your medicine because you may develop a severe reaction. Your body becomes used to the medicine. This does NOT mean you are addicted. Addiction is a behavior related to getting and using a drug for a non-medical reason. If you have pain, you have a medical reason to take pain medicine. Your doctor will tell you how much medicine to take. If your doctor wants you to stop the medicine, the dose will be slowly lowered over time to avoid any side effects. There are different types of narcotic medicines (opiates). If you take more than one type at the same time or if you are taking another medicine that also causes drowsiness, you may have more side effects. Give your health care provider a list of all medicines you use. Your doctor will tell you how much medicine to take. Do not take more medicine than directed. Call emergency for help if you have problems breathing or unusual sleepiness. Do not take other medicines that contain acetaminophen with this medicine. Always read labels carefully. If you have questions, ask your doctor or pharmacist. If you take too much acetaminophen get medical help right away. Too much acetaminophen can be very dangerous and cause liver damage. Even if you do not have symptoms, it is important to get help right away. You may get drowsy or dizzy. Do not drive, use machinery, or do anything that needs mental alertness until you know  how this medicine affects you. Do not stand or sit up quickly, especially if you are an older patient. This reduces the risk of dizzy or fainting spells. Alcohol may interfere with the effect of this medicine. Avoid alcoholic drinks. The medicine will cause constipation. Try to have a bowel movement at least every 2 to 3 days. If you do not have a bowel movement for 3 days, call your doctor or health care professional. Your  mouth may get dry. Chewing sugarless gum or sucking hard candy, and drinking plenty or water may help. Contact your doctor if the problem does not go away or is severe. What side effects may I notice from receiving this medicine? Side effects that you should report to your doctor or health care professional as soon as possible: -allergic reactions like skin rash, itching or hives, swelling of the face, lips, or tongue -breathing problems -confusion -redness, blistering, peeling or loosening of the skin, including inside the mouth -signs and symptoms of liver injury like dark yellow or brown urine; general ill feeling or flu-like symptoms; light-colored stools; loss of appetite; nausea; right upper belly pain; unusually weak or tired; yellowing of the eyes or skin -signs and symptoms of low blood pressure like dizziness; feeling faint or lightheaded, falls; unusually weak or tired -trouble passing urine or change in the amount of urine Side effects that usually do not require medical attention (report to your doctor or health care professional if they continue or are bothersome): -constipation -dry mouth -nausea, vomiting -tiredness This list may not describe all possible side effects. Call your doctor for medical advice about side effects. You may report side effects to FDA at 1-800-FDA-1088. Where should I keep my medicine? Keep out of the reach of children. This medicine can be abused. Keep your medicine in a safe place to protect it from theft. Do not share this medicine with anyone. Selling or giving away this medicine is dangerous and against the law. This medicine may cause accidental overdose and death if it taken by other adults, children, or pets. Mix any unused medicine with a substance like cat litter or coffee grounds. Then throw the medicine away in a sealed container like a sealed bag or a coffee can with a lid. Do not use the medicine after the expiration date. Store at room  temperature between 20 and 25 degrees C (68 and 77 degrees F). NOTE: This sheet is a summary. It may not cover all possible information. If you have questions about this medicine, talk to your doctor, pharmacist, or health care provider.  2018 Elsevier/Gold Standard (2015-07-26 21:48:12)

## 2017-07-05 ENCOUNTER — Telehealth: Payer: Self-pay

## 2017-07-05 NOTE — Telephone Encounter (Signed)
Told Jordan Bush the the pathology from her surgery showed no residual disease in the margins.  No further treatment is needed.  Keep follow up on 08-03-17 as scheduled.

## 2017-07-06 NOTE — Progress Notes (Addendum)
Pt came in today to have Derma bond reapplied to Incision at [place]. Pt Afebrile. Pt Alert and Oriented.  Not in any acute distress. No active bleeding noted. Incision with Band-Aid covering it.   Band-Aid removed with no visible drainage.  Small amount of blood under the post operative Derma bond. Site dry and intact.  Applied fresh Derma Bond to incision at [facility]. Gave patient a Telfa pad,  4x4 gauze, and Hypo fix if incision line opens to create a pressure dressing.  She can call the office on  Monday morning to be seen by Dr. Denman George. Patient verbalized understanding.

## 2017-08-03 ENCOUNTER — Encounter: Payer: Self-pay | Admitting: Gynecologic Oncology

## 2017-08-03 ENCOUNTER — Ambulatory Visit: Payer: 59 | Attending: Gynecologic Oncology | Admitting: Gynecologic Oncology

## 2017-08-03 VITALS — BP 126/88 | HR 70 | Temp 98.8°F | Resp 18 | Ht 62.0 in | Wt 266.5 lb

## 2017-08-03 DIAGNOSIS — R7303 Prediabetes: Secondary | ICD-10-CM | POA: Insufficient documentation

## 2017-08-03 DIAGNOSIS — Z90710 Acquired absence of both cervix and uterus: Secondary | ICD-10-CM | POA: Insufficient documentation

## 2017-08-03 DIAGNOSIS — Z9889 Other specified postprocedural states: Secondary | ICD-10-CM | POA: Insufficient documentation

## 2017-08-03 DIAGNOSIS — N898 Other specified noninflammatory disorders of vagina: Secondary | ICD-10-CM | POA: Diagnosis not present

## 2017-08-03 DIAGNOSIS — F329 Major depressive disorder, single episode, unspecified: Secondary | ICD-10-CM | POA: Diagnosis not present

## 2017-08-03 DIAGNOSIS — J45909 Unspecified asthma, uncomplicated: Secondary | ICD-10-CM | POA: Insufficient documentation

## 2017-08-03 DIAGNOSIS — Z9079 Acquired absence of other genital organ(s): Secondary | ICD-10-CM | POA: Insufficient documentation

## 2017-08-03 DIAGNOSIS — N870 Mild cervical dysplasia: Secondary | ICD-10-CM | POA: Diagnosis not present

## 2017-08-03 DIAGNOSIS — Z800 Family history of malignant neoplasm of digestive organs: Secondary | ICD-10-CM | POA: Diagnosis not present

## 2017-08-03 DIAGNOSIS — D069 Carcinoma in situ of cervix, unspecified: Secondary | ICD-10-CM | POA: Diagnosis not present

## 2017-08-03 DIAGNOSIS — Z86001 Personal history of in-situ neoplasm of cervix uteri: Secondary | ICD-10-CM | POA: Diagnosis not present

## 2017-08-03 DIAGNOSIS — F419 Anxiety disorder, unspecified: Secondary | ICD-10-CM | POA: Insufficient documentation

## 2017-08-03 DIAGNOSIS — I10 Essential (primary) hypertension: Secondary | ICD-10-CM | POA: Insufficient documentation

## 2017-08-03 DIAGNOSIS — E669 Obesity, unspecified: Secondary | ICD-10-CM | POA: Diagnosis not present

## 2017-08-03 MED ORDER — METRONIDAZOLE 500 MG PO TABS: 500.0000 mg | ORAL_TABLET | Freq: Three times a day (TID) | ORAL | 0 refills | Status: DC
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
## 2017-08-03 NOTE — Patient Instructions (Addendum)
Please return to see Dr Charlesetta Garibaldi for annual vaginal pap smears for at least 20 years as you continue to have a risk for development of vaginal cancer.  Please take flagyl 3 times a day for 7 days. Avoid alcohol while you are taking this.

## 2017-09-17 ENCOUNTER — Telehealth: Payer: Self-pay | Admitting: General Practice

## 2017-09-17 NOTE — Telephone Encounter (Signed)
[facility] - Client [facility]  Patient Name: Jordan Bush  DOB: 22-Jul-1971    Initial Comment Caller states that she has been off her blood pressure medication for a few weeks now and over the weekend her blood pressure spiked. Her pharmacist gave her an emergency amount that will only last till tomorrow. She has an appointment on Thursday but wants to know what she should do till then. 145/97 was her blood pressure this morning.   Nurse Assessment  Nurse: Joline Salt, RN, Malachy Mood Date/Time Eilene Ghazi Time): 09/17/2017 4:12:57 PM  Confirm and document reason for call. If symptomatic, describe symptoms. ---Caller states that she has been off her blood pressure medication for a few weeks now and over the weekend her blood pressure spiked. Her pharmacist gave her an emergency amount that will only last till tomorrow. She has an appointment on Thursday but wants to know what she should do till then. 145/97 was her blood pressure this morning. Saturday it was 165/102.  Does the patient have any new or worsening symptoms? ---Yes  Will a triage be completed? ---Yes  Related visit to physician within the last 2 weeks? ---N/A  Does the PT have any chronic conditions? (i.e. diabetes, asthma, etc.) ---Yes  List chronic conditions. ---asthma, hypertension  Is the patient pregnant or possibly pregnant? (Ask all females between the ages of 26-55) ---No  Is this a behavioral health or substance abuse call? ---No     Guidelines    Guideline Title Affirmed Question Affirmed Notes  High Blood Pressure [3] Systolic BP >= 664 OR Diastolic >= 80 AND [4] not taking BP medications    Final Disposition User   See PCP within [place], RN, Malachy Mood    Comments  Recommended that she keep record of her BP prior to taking BP meds and then after, take this record to MD office. Recommended that if she has BP > 140/.>90 that she go to urgent care if  she runs out of BP med and has not seen the MD yet. Patient verbalized understanding.   Referrals  REFERRED TO PCP OFFICE   Caller Disagree/Comply Comply  Caller Understands Yes  PreDisposition Did not know what to do

## 2017-09-18 NOTE — Telephone Encounter (Signed)
FYI: New Patient 09/20/17.Marland KitchenSLS 11/06

## 2017-09-20 ENCOUNTER — Ambulatory Visit (INDEPENDENT_AMBULATORY_CARE_PROVIDER_SITE_OTHER): Payer: 59 | Admitting: Family Medicine

## 2017-09-20 ENCOUNTER — Encounter: Payer: Self-pay | Admitting: Family Medicine

## 2017-09-20 VITALS — BP 142/102 | HR 61 | Temp 98.4°F | Ht 62.0 in | Wt 267.4 lb

## 2017-09-20 DIAGNOSIS — I10 Essential (primary) hypertension: Secondary | ICD-10-CM | POA: Diagnosis not present

## 2017-09-20 DIAGNOSIS — Z23 Encounter for immunization: Secondary | ICD-10-CM

## 2017-09-20 MED ORDER — AMLODIPINE BESYLATE 5 MG PO TABS: 5.0000 mg | ORAL_TABLET | Freq: Every day | ORAL | 3 refills | Status: DC

## 2017-09-20 NOTE — Patient Instructions (Signed)
Around 3 times per week, check your blood pressure 4 times per day. Twice in the morning and twice in the evening. The readings should be at least one minute apart. Write down these values and bring them to your next nurse visit/appointment.  When you check your BP, make sure you have been doing something calm/relaxing 5 minutes prior to checking. Both feet should be flat on the floor and you should be sitting. Use your left arm and make sure it is in a relaxed position (on a table), and that the cuff is at the approximate level/height of your heart.  Start lifting weights.  Healthy Eating Plan Many factors influence your heart health, including eating and exercise habits. Heart (coronary) risk increases with abnormal blood fat (lipid) levels. Heart-healthy meal planning includes limiting unhealthy fats, increasing healthy fats, and making other small dietary changes. This includes maintaining a healthy body weight to help keep lipid levels within a normal range.  WHAT IS MY PLAN?  Your health care provider recommends that you:  Drink a glass of water before meals to help with satiety.  Eat slowly.  An alternative to the water is to add Metamucil. This will help with satiety as well. It does contain calories, unlike water.  WHAT TYPES OF FAT SHOULD I CHOOSE?  Choose healthy fats more often. Choose monounsaturated and polyunsaturated fats, such as olive oil and canola oil, flaxseeds, walnuts, almonds, and seeds.  Eat more omega-3 fats. Good choices include salmon, mackerel, sardines, tuna, flaxseed oil, and ground flaxseeds. Aim to eat fish at least two times each week.  Avoid foods with partially hydrogenated oils in them. These contain trans fats. Examples of foods that contain trans fats are stick margarine, some tub margarines, cookies, crackers, and other baked goods. If you are going to avoid a fat, this is the one to avoid!  WHAT GENERAL GUIDELINES DO I NEED TO FOLLOW?  Check food  labels carefully to identify foods with trans fats. Avoid these types of options when possible.  Fill one half of your plate with vegetables and green salads. Eat 4-5 servings of vegetables per day. A serving of vegetables equals 1 cup of raw leafy vegetables,  cup of raw or cooked cut-up vegetables, or  cup of vegetable juice.  Fill one fourth of your plate with whole grains. Look for the word "whole" as the first word in the ingredient list.  Fill one fourth of your plate with lean protein foods.  Eat 4-5 servings of fruit per day. A serving of fruit equals one medium whole fruit,  cup of dried fruit,  cup of fresh, frozen, or canned fruit. Try to avoid fruits in cups/syrups as the sugar content can be high.  Eat more foods that contain soluble fiber. Examples of foods that contain this type of fiber are apples, broccoli, carrots, beans, peas, and barley. Aim to get 20-30 g of fiber per day.  Eat more home-cooked food and less restaurant, buffet, and fast food.  Limit or avoid alcohol.  Limit foods that are high in starch and sugar.  Avoid fried foods when able.  Cook foods by using methods other than frying. Baking, boiling, grilling, and broiling are all great options. Other fat-reducing suggestions include: ? Removing the skin from poultry. ? Removing all visible fats from meats. ? Skimming the fat off of stews, soups, and gravies before serving them. ? Steaming vegetables in water or broth.  Lose weight if you are overweight. Losing just 5-10%  of your initial body weight can help your overall health and prevent diseases such as diabetes and heart disease.  Increase your consumption of nuts, legumes, and seeds to 4-5 servings per week. One serving of dried beans or legumes equals  cup after being cooked, one serving of nuts equals 1 ounces, and one serving of seeds equals  ounce or 1 tablespoon.  WHAT ARE GOOD FOODS CAN I EAT? Grains Grainy breads (try to find bread that  is 3 g of fiber per slice or greater), oatmeal, light popcorn. Whole-grain cereals. Rice and pasta, including brown rice and those that are made with whole wheat. Edamame pasta is a great alternative to grain pasta. It has a higher protein content. Try to avoid significant consumption of white bread, sugary cereals, or pastries/baked goods.  Vegetables All vegetables. Cooked white potatoes do not count as vegetables.  Fruits All fruits, but limit pineapple and bananas as these fruits have a higher sugar content.  Meats and Other Protein Sources Lean, well-trimmed beef, veal, pork, and lamb. Chicken and [place] without skin. All fish and shellfish. Wild duck, rabbit, pheasant, and venison. Egg whites or low-cholesterol egg substitutes. Dried beans, peas, lentils, and tofu.Seeds and most nuts.  Dairy Low-fat or nonfat cheeses, including ricotta, string, and mozzarella. Skim or 1% milk that is liquid, powdered, or evaporated. Buttermilk that is made with low-fat milk. Nonfat or low-fat yogurt. Soy/Almond milk are good alternatives if you cannot handle dairy.  Beverages Water is the best for you. Sports drinks with less sugar are more desirable unless you are a highly active athlete.  Sweets and Desserts Sherbets and fruit ices. Honey, jam, marmalade, jelly, and syrups. Dark chocolate.  Eat all sweets and desserts in moderation.  Fats and Oils Nonhydrogenated (trans-free) margarines. Vegetable oils, including soybean, sesame, sunflower, olive, peanut, safflower, corn, canola, and cottonseed. Salad dressings or mayonnaise that are made with a vegetable oil. Limit added fats and oils that you use for cooking, baking, salads, and as spreads.  Other Cocoa powder. Coffee and tea. Most condiments.  The items listed above may not be a complete list of recommended foods or beverages. Contact your dietitian for more options.
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
## 2017-09-20 NOTE — Progress Notes (Signed)
Pre visit review using our clinic review tool, if applicable. No additional management support is needed unless otherwise documented below in the visit note. 

## 2017-09-21 ENCOUNTER — Encounter: Payer: 59 | Admitting: Family Medicine

## 2017-09-25 ENCOUNTER — Telehealth: Payer: Self-pay | Admitting: *Deleted

## 2017-09-25 NOTE — Telephone Encounter (Signed)
Received Medical records from [facility]; forwarded to provider/SLS 11/13

## 2017-10-11 ENCOUNTER — Encounter: Payer: Self-pay | Admitting: Family Medicine

## 2017-10-11 ENCOUNTER — Ambulatory Visit (INDEPENDENT_AMBULATORY_CARE_PROVIDER_SITE_OTHER): Payer: 59 | Admitting: Family Medicine

## 2017-10-11 VITALS — BP 122/88 | HR 78 | Temp 98.0°F | Ht 62.0 in | Wt 264.1 lb

## 2017-10-11 DIAGNOSIS — M25511 Pain in right shoulder: Secondary | ICD-10-CM

## 2017-10-11 MED ORDER — METHYLPREDNISOLONE ACETATE 40 MG/ML IJ SUSP
40.0000 mg | Freq: Once | INTRAMUSCULAR | Status: AC
  Administered 2017-10-11: 40 mg via INTRAMUSCULAR

## 2017-10-11 NOTE — Addendum Note (Signed)
Addended by: Sharon Seller B on: 10/11/2017 04:40 PM   Modules accepted: Orders

## 2017-10-11 NOTE — Progress Notes (Signed)
Pre visit review using our clinic review tool, if applicable. No additional management support is needed unless otherwise documented below in the visit note. 
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
## 2017-10-11 NOTE — Patient Instructions (Signed)
Send me a MyChart message in 4 weeks if things are not better.   Let me know if anything changes.  Ice/cold pack over area for 10-15 min every 2-3 hours while awake.  Heat (pad or rice pillow in microwave) over affected area, 10-15 minutes every 2-3 hours while awake.   Ibuprofen 400-600 mg (2-3 over the counter strength tabs) every 6 hours as needed for pain.  OK to take Tylenol 1000 mg (2 extra strength tabs) or 975 mg (3 regular strength tabs) every 6 hours as needed.  EXERCISES  RANGE OF MOTION (ROM) AND STRETCHING EXERCISES These exercises may help you when beginning to rehabilitate your injury. While completing these exercises, remember:   Restoring tissue flexibility helps normal motion to return to the joints. This allows healthier, less painful movement and activity.  An effective stretch should be held for at least 30 seconds.  A stretch should never be painful. You should only feel a gentle lengthening or release in the stretched tissue.  ROM - Pendulum  Bend at the waist so that your right / left arm falls away from your body. Support yourself with your opposite hand on a solid surface, such as a table or a countertop.  Your right / left arm should be perpendicular to the ground. If it is not perpendicular, you need to lean over farther. Relax the muscles in your right / left arm and shoulder as much as possible.  Gently sway your hips and trunk so they move your right / left arm without any use of your right / left shoulder muscles.  Progress your movements so that your right / left arm moves side to side, then forward and backward, and finally, both clockwise and counterclockwise.  Complete 10-15 repetitions in each direction. Many people use this exercise to relieve discomfort in their shoulder as well as to gain range of motion. Repeat 2 times. Complete this exercise 3 times per week.  STRETCH - Flexion, Standing  Stand with good posture. With an underhand grip on  your right / left hand and an overhand grip on the opposite hand, grasp a broomstick or cane so that your hands are a little more than shoulder-width apart.  Keeping your right / left elbow straight and shoulder muscles relaxed, push the stick with your opposite hand to raise your right / left arm in front of your body and then overhead. Raise your arm until you feel a stretch in your right / left shoulder, but before you have increased shoulder pain.  Try to avoid shrugging your right / left shoulder as your arm rises by keeping your shoulder blade tucked down and toward your mid-back spine. Hold 30 seconds.  Slowly return to the starting position. Repeat 2 times. Complete this exercise 3 times per week.  STRETCH - Internal Rotation  Place your right / left hand behind your back, palm-up.  Throw a towel or belt over your opposite shoulder. Grasp the towel/belt with your right / left hand.  While keeping an upright posture, gently pull up on the towel/belt until you feel a stretch in the front of your right / left shoulder.  Avoid shrugging your right / left shoulder as your arm rises by keeping your shoulder blade tucked down and toward your mid-back spine.  Hold 30. Release the stretch by lowering your opposite hand. Repeat 2 times. Complete this exercise 3 times per week.  STRETCH - External Rotation and Abduction  Stagger your stance through a doorframe.  It does not matter which foot is forward.  As instructed by your physician, physical therapist or athletic trainer, place your hands: ? And forearms above your head and on the door frame. ? And forearms at head-height and on the door frame. ? At elbow-height and on the door frame.  Keeping your head and chest upright and your stomach muscles tight to prevent over-extending your low-back, slowly shift your weight onto your front foot until you feel a stretch across your chest and/or in the front of your shoulders.  Hold 30  seconds. Shift your weight to your back foot to release the stretch. Repeat 2 times. Complete this stretch 3 times per week.   STRENGTHENING EXERCISES  These exercises may help you when beginning to rehabilitate your injury. They may resolve your symptoms with or without further involvement from your physician, physical therapist or athletic trainer. While completing these exercises, remember:   Muscles can gain both the endurance and the strength needed for everyday activities through controlled exercises.  Complete these exercises as instructed by your physician, physical therapist or athletic trainer. Progress the resistance and repetitions only as guided.  You may experience muscle soreness or fatigue, but the pain or discomfort you are trying to eliminate should never worsen during these exercises. If this pain does worsen, stop and make certain you are following the directions exactly. If the pain is still present after adjustments, discontinue the exercise until you can discuss the trouble with your clinician.  If advised by your physician, during your recovery, avoid activity or exercises which involve actions that place your right / left hand or elbow above your head or behind your back or head. These positions stress the tissues which are trying to heal.  STRENGTH - Scapular Depression and Adduction  With good posture, sit on a firm chair. Supported your arms in front of you with pillows, arm rests or a table top. Have your elbows in line with the sides of your body.  Gently draw your shoulder blades down and toward your mid-back spine. Gradually increase the tension without tensing the muscles along the top of your shoulders and the back of your neck.  Hold for 3 seconds. Slowly release the tension and relax your muscles completely before completing the next repetition.  After you have practiced this exercise, remove the arm support and complete it in standing as well as  sitting. Repeat 2 times. Complete this exercise 3 times per week.   STRENGTH - External Rotators  Secure a rubber exercise band/tubing to a fixed object so that it is at the same height as your right / left elbow when you are standing or sitting on a firm surface.  Stand or sit so that the secured exercise band/tubing is at your side that is not injured.  Bend your elbow 90 degrees. Place a folded towel or small pillow under your right / left arm so that your elbow is a few inches away from your side.  Keeping the tension on the exercise band/tubing, pull it away from your body, as if pivoting on your elbow. Be sure to keep your body steady so that the movement is only coming from your shoulder rotating.  Hold 3 seconds. Release the tension in a controlled manner as you return to the starting position. Repeat 2 times. Complete this exercise 3 times per week.   STRENGTH - Supraspinatus  Stand or sit with good posture. Grasp a 2-3 lb weight or an exercise band/tubing so  that your hand is "thumbs-up," like when you shake hands.  Slowly lift your right / left hand from your thigh into the air, traveling about 30 degrees from straight out at your side. Lift your hand to shoulder height or as far as you can without increasing any shoulder pain. Initially, many people do not lift their hands above shoulder height.  Avoid shrugging your right / left shoulder as your arm rises by keeping your shoulder blade tucked down and toward your mid-back spine.  Hold for 3 seconds. Control the descent of your hand as you slowly return to your starting position. Repeat 2 times. Complete this exercise 3 times per week.   STRENGTH - Shoulder Extensors  Secure a rubber exercise band/tubing so that it is at the height of your shoulders when you are either standing or sitting on a firm arm-less chair.  With a thumbs-up grip, grasp an end of the band/tubing in each hand. Straighten your elbows and lift your  hands straight in front of you at shoulder height. Step back away from the secured end of band/tubing until it becomes tense.  Squeezing your shoulder blades together, pull your hands down to the sides of your thighs. Do not allow your hands to go behind you.  Hold for 3 seconds. Slowly ease the tension on the band/tubing as you reverse the directions and return to the starting position. Repeat 2 times. Complete this exercise 3 times per week.   STRENGTH - Scapular Retractors  Secure a rubber exercise band/tubing so that it is at the height of your shoulders when you are either standing or sitting on a firm arm-less chair.  With a palm-down grip, grasp an end of the band/tubing in each hand. Straighten your elbows and lift your hands straight in front of you at shoulder height. Step back away from the secured end of band/tubing until it becomes tense.  Squeezing your shoulder blades together, draw your elbows back as you bend them. Keep your upper arm lifted away from your body throughout the exercise.  Hold 3 seconds. Slowly ease the tension on the band/tubing as you reverse the directions and return to the starting position. Repeat 2 times. Complete this exercise 3 times per week.  STRENGTH - Scapular Depressors  Find a sturdy chair without wheels, such as a from a dining room table.  Keeping your feet on the floor, lift your bottom from the seat and lock your elbows.  Keeping your elbows straight, allow gravity to pull your body weight down. Your shoulders will rise toward your ears.  Raise your body against gravity by drawing your shoulder blades down your back, shortening the distance between your shoulders and ears. Although your feet should always maintain contact with the floor, your feet should progressively support less body weight as you get stronger.  Hold 3 seconds. In a controlled and slow manner, lower your body weight to begin the next repetition. Repeat 2 times. Complete  this exercise 3 times per week.   This information is not intended to replace advice given to you by your health care provider. Make sure you discuss any questions you have with your health care provider.  Document Released: 09/13/2005 Document Revised: 11/20/2014 Document Reviewed: 02/11/2009 Elsevier Interactive Patient Education [employer].

## 2017-10-24 ENCOUNTER — Encounter: Payer: 59 | Admitting: Family Medicine

## 2017-11-02 ENCOUNTER — Encounter: Payer: Self-pay | Admitting: Family Medicine

## 2017-11-02 ENCOUNTER — Ambulatory Visit (INDEPENDENT_AMBULATORY_CARE_PROVIDER_SITE_OTHER): Payer: 59 | Admitting: Family Medicine

## 2017-11-02 VITALS — BP 122/82 | HR 78 | Temp 98.2°F | Ht 62.0 in | Wt 265.4 lb

## 2017-11-02 DIAGNOSIS — F418 Other specified anxiety disorders: Secondary | ICD-10-CM

## 2017-11-02 DIAGNOSIS — M25511 Pain in right shoulder: Secondary | ICD-10-CM | POA: Diagnosis not present
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
## 2017-11-02 NOTE — Progress Notes (Signed)
Pre visit review using our clinic review tool, if applicable. No additional management support is needed unless otherwise documented below in the visit note. 

## 2017-11-02 NOTE — Patient Instructions (Addendum)
Let me know if you are ever interested in physical therapy for your shoulder.  Keep the diet clean.  Aim to do some physical exertion for 150 minutes per week. This is typically divided into 5 days per week, 30 minutes per day. The activity should be enough to get your heart rate up. Anything is better than nothing if you have time constraints.  Coping skills Choose 5 that work for you:  Take a deep breath  Count to 20  Read a book  Do a puzzle  Meditate  Bake  Sing  Knit  Garden  Pray  Go outside  Call a friend  Listen to music  Take a walk  Color  Send a note  Take a bath  Watch a movie  Be alone in a quiet place  Pet an animal  Visit a friend  Journal  Exercise  Stretch   Let us know if you need anything.

## 2018-01-02 ENCOUNTER — Encounter: Payer: Self-pay | Admitting: Family Medicine

## 2018-01-02 ENCOUNTER — Ambulatory Visit (INDEPENDENT_AMBULATORY_CARE_PROVIDER_SITE_OTHER): Payer: BLUE CROSS/BLUE SHIELD | Admitting: Family Medicine

## 2018-01-02 VITALS — BP 132/98 | HR 72 | Temp 98.9°F | Ht 62.0 in | Wt 264.2 lb

## 2018-01-02 DIAGNOSIS — I10 Essential (primary) hypertension: Secondary | ICD-10-CM | POA: Diagnosis not present

## 2018-01-02 DIAGNOSIS — F418 Other specified anxiety disorders: Secondary | ICD-10-CM | POA: Diagnosis not present

## 2018-01-02 MED ORDER — SERTRALINE HCL 50 MG PO TABS: 50.0000 mg | ORAL_TABLET | Freq: Every day | ORAL | 3 refills | Status: DC

## 2018-01-02 MED ORDER — CHLORTHALIDONE 25 MG PO TABS: 12.5000 mg | ORAL_TABLET | Freq: Every day | ORAL | 1 refills | Status: DC
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
## 2018-01-02 NOTE — Patient Instructions (Signed)
Keep checking blood pressures at home.  Aim to do some physical exertion for 150 minutes per week. This is typically divided into 5 days per week, 30 minutes per day. The activity should be enough to get your heart rate up. Anything is better than nothing if you have time constraints.  Keep the diet clean.  Please consider counseling. Contact [phone] to schedule an appointment or inquire about cost/insurance coverage.  Let us know if you need anything.

## 2018-01-02 NOTE — Progress Notes (Signed)
Pre visit review using our clinic review tool, if applicable. No additional management support is needed unless otherwise documented below in the visit note. 

## 2018-01-09 ENCOUNTER — Other Ambulatory Visit (INDEPENDENT_AMBULATORY_CARE_PROVIDER_SITE_OTHER): Payer: BLUE CROSS/BLUE SHIELD

## 2018-01-09 DIAGNOSIS — I10 Essential (primary) hypertension: Secondary | ICD-10-CM

## 2018-01-09 LAB — BASIC METABOLIC PANEL
BUN: 16 mg/dL (ref 6–23)
CALCIUM: 9.7 mg/dL (ref 8.4–10.5)
CO2: 27 mEq/L (ref 19–32)
Chloride: 99 mEq/L (ref 96–112)
Creatinine, Ser: 1.03 mg/dL (ref 0.40–1.20)
GFR: 74.3 mL/min (ref >60.00)
Glucose, Bld: 101 mg/dL — ABNORMAL HIGH (ref 70–99)
Potassium: 3.5 mEq/L (ref 3.5–5.1)
SODIUM: 137 meq/L (ref 135–145)

## 2018-01-16 ENCOUNTER — Other Ambulatory Visit: Payer: Self-pay | Admitting: Family Medicine

## 2018-01-30 ENCOUNTER — Ambulatory Visit: Payer: BLUE CROSS/BLUE SHIELD | Admitting: Family Medicine

## 2018-02-07 ENCOUNTER — Encounter: Payer: Self-pay | Admitting: Family Medicine

## 2018-02-07 ENCOUNTER — Ambulatory Visit (INDEPENDENT_AMBULATORY_CARE_PROVIDER_SITE_OTHER): Payer: BLUE CROSS/BLUE SHIELD | Admitting: Family Medicine

## 2018-02-07 VITALS — BP 108/80 | HR 83 | Temp 98.6°F | Ht 62.0 in | Wt 260.2 lb

## 2018-02-07 DIAGNOSIS — I10 Essential (primary) hypertension: Secondary | ICD-10-CM | POA: Diagnosis not present

## 2018-02-07 DIAGNOSIS — F418 Other specified anxiety disorders: Secondary | ICD-10-CM | POA: Diagnosis not present

## 2018-02-07 DIAGNOSIS — J4520 Mild intermittent asthma, uncomplicated: Secondary | ICD-10-CM | POA: Diagnosis not present

## 2018-02-07 MED ORDER — MONTELUKAST SODIUM 10 MG PO TABS: 10.0000 mg | ORAL_TABLET | Freq: Every day | ORAL | 2 refills | Status: DC

## 2018-02-07 MED ORDER — CHLORTHALIDONE 25 MG PO TABS: 12.5000 mg | ORAL_TABLET | Freq: Every day | ORAL | 1 refills | Status: DC

## 2018-02-07 MED ORDER — AMLODIPINE BESYLATE 5 MG PO TABS: ORAL_TABLET | ORAL | 1 refills | Status: DC

## 2018-02-07 MED ORDER — SERTRALINE HCL 25 MG PO TABS: 25.0000 mg | ORAL_TABLET | Freq: Every day | ORAL | 1 refills | Status: DC

## 2018-02-07 NOTE — Patient Instructions (Signed)
Goal weight by next visit: 245 lbs  Stay physically active and eat cleanly.  Keep up the good work!

## 2018-02-07 NOTE — Progress Notes (Signed)
Pre visit review using our clinic review tool, if applicable. No additional management support is needed unless otherwise documented below in the visit note. 
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
## 2018-03-20 ENCOUNTER — Telehealth: Payer: Self-pay | Admitting: Family Medicine

## 2018-03-20 NOTE — Telephone Encounter (Signed)
I don't see a message or telephone encounter with a message?

## 2018-03-20 NOTE — Telephone Encounter (Signed)
I see no messages

## 2018-03-22 ENCOUNTER — Telehealth: Payer: Self-pay

## 2018-03-22 NOTE — Telephone Encounter (Signed)
Jordan Dobratz states that she had some light vaginal bleeding 2 days ago with cramping.  She had a hysterectomy in 8/18.  Reviewed with Joylene John, NP.  She recommends that Jordan Bush see Dr. Charlesetta Garibaldi to be evaluated.  If she needs to be seen in follow up, Dr. Charlesetta Garibaldi or she can call the office back as Dr. Denman George does not have an opening for a few weeks. Jordan Hardge verbalized understanding.

## 2018-05-06 ENCOUNTER — Other Ambulatory Visit: Payer: Self-pay | Admitting: Family Medicine

## 2018-05-06 DIAGNOSIS — I10 Essential (primary) hypertension: Secondary | ICD-10-CM

## 2018-05-10 ENCOUNTER — Ambulatory Visit: Payer: BLUE CROSS/BLUE SHIELD | Admitting: Family Medicine

## 2018-05-29 ENCOUNTER — Ambulatory Visit: Payer: 59 | Admitting: Family Medicine

## 2018-05-29 ENCOUNTER — Encounter: Payer: Self-pay | Admitting: Family Medicine

## 2018-05-29 VITALS — BP 120/76 | HR 84 | Temp 99.0°F | Ht 62.0 in | Wt 266.2 lb

## 2018-05-29 DIAGNOSIS — F418 Other specified anxiety disorders: Secondary | ICD-10-CM

## 2018-05-29 DIAGNOSIS — Z6841 Body Mass Index (BMI) 40.0 and over, adult: Secondary | ICD-10-CM | POA: Insufficient documentation

## 2018-05-29 DIAGNOSIS — I10 Essential (primary) hypertension: Secondary | ICD-10-CM

## 2018-05-29 MED ORDER — CHLORTHALIDONE 25 MG PO TABS: ORAL_TABLET | ORAL | 3 refills | Status: DC

## 2018-05-29 NOTE — Progress Notes (Signed)
Pre visit review using our clinic review tool, if applicable. No additional management support is needed unless otherwise documented below in the visit note. 
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
## 2018-05-29 NOTE — Patient Instructions (Addendum)
Call Center for [place] at [facility] at [phone] for an appointment.  They are located at [address], [address], [place], [place], [address] (right across the hall from our office).  You can check your blood pressure as much or little as you want.  Keep the diet clean. Meal prep!  Stay active.  OK to stop Zoloft. You don't need to wean. 4-6 weeks and we will know if you need to be back on it. Let me know if that is the case.  Your goal weight in 6 mo: 250 lbs or less. You can do it! Write down your goals.   Let us know if you need anything.

## 2018-08-21 ENCOUNTER — Other Ambulatory Visit: Payer: Self-pay | Admitting: Family Medicine

## 2018-08-21 DIAGNOSIS — I10 Essential (primary) hypertension: Secondary | ICD-10-CM

## 2018-11-17 ENCOUNTER — Other Ambulatory Visit: Payer: Self-pay | Admitting: Family Medicine

## 2018-11-17 DIAGNOSIS — I10 Essential (primary) hypertension: Secondary | ICD-10-CM

## 2018-11-29 ENCOUNTER — Encounter: Payer: Self-pay | Admitting: Family Medicine

## 2018-11-29 ENCOUNTER — Ambulatory Visit: Payer: BLUE CROSS/BLUE SHIELD | Admitting: Family Medicine

## 2018-11-29 VITALS — BP 110/78 | HR 80 | Temp 98.5°F | Ht 62.0 in | Wt 265.0 lb

## 2018-11-29 DIAGNOSIS — Z0000 Encounter for general adult medical examination without abnormal findings: Secondary | ICD-10-CM

## 2018-11-29 DIAGNOSIS — R0683 Snoring: Secondary | ICD-10-CM

## 2018-11-29 DIAGNOSIS — R5383 Other fatigue: Secondary | ICD-10-CM

## 2018-11-29 DIAGNOSIS — Z114 Encounter for screening for human immunodeficiency virus [HIV]: Secondary | ICD-10-CM | POA: Diagnosis not present

## 2018-11-29 DIAGNOSIS — Z1239 Encounter for other screening for malignant neoplasm of breast: Secondary | ICD-10-CM

## 2018-11-29 LAB — COMPREHENSIVE METABOLIC PANEL
ALBUMIN: 4 g/dL (ref 3.5–5.2)
ALT: 16 U/L (ref 0–35)
AST: 15 U/L (ref 0–37)
Alkaline Phosphatase: 56 U/L (ref 39–117)
BUN: 17 mg/dL (ref 6–23)
CALCIUM: 9.3 mg/dL (ref 8.4–10.5)
CHLORIDE: 104 meq/L (ref 96–112)
CO2: 27 mEq/L (ref 19–32)
Creatinine, Ser: 0.96 mg/dL (ref 0.40–1.20)
GFR: 75.52 mL/min (ref >60.00)
Glucose, Bld: 94 mg/dL (ref 70–99)
POTASSIUM: 4.1 meq/L (ref 3.5–5.1)
SODIUM: 138 meq/L (ref 135–145)
Total Bilirubin: 0.3 mg/dL (ref 0.2–1.2)
Total Protein: 6.9 g/dL (ref 6.0–8.3)

## 2018-11-29 LAB — CBC
HEMATOCRIT: 36.2 % (ref 36.0–46.0)
HEMOGLOBIN: 11.8 g/dL — AB (ref 12.0–15.0)
MCHC: 32.6 g/dL (ref 30.0–36.0)
MCV: 84.3 fl (ref 78.0–100.0)
PLATELETS: 237 10*3/uL (ref 150.0–400.0)
RBC: 4.3 Mil/uL (ref 3.87–5.11)
RDW: 15.5 % (ref 11.5–15.5)
WBC: 6.5 10*3/uL (ref 4.0–10.5)

## 2018-11-29 LAB — LIPID PANEL
Cholesterol: 168 mg/dL (ref 0–200)
HDL: 38.1 mg/dL — ABNORMAL LOW (ref >39.00)
LDL CALC: 104 mg/dL — AB (ref 0–99)
NonHDL: 129.42
Total CHOL/HDL Ratio: 4
Triglycerides: 126 mg/dL (ref 0.0–149.0)
VLDL: 25.2 mg/dL (ref 0.0–40.0)
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
## 2018-11-29 NOTE — Patient Instructions (Addendum)
Give [name] 2-3 business days to get the results of your labs back.  Find out how much your insurance covers painful keloid injections.    Consider calling your allergist if your symptoms are bothersome.   [facility] [place] at [facility] at [phone] for an appointment.  They are located at [address], [address], [place], [place], [address] (right across the hall from our office).  If you do not hear anything about your referral in the next 1-2 weeks, call our office and ask for an update.  Keep the diet clean and stay active.  Let us know if you need anything.

## 2018-11-29 NOTE — Progress Notes (Signed)
Pre visit review using our clinic review tool, if applicable. No additional management support is needed unless otherwise documented below in the visit note. 

## 2018-11-30 LAB — HIV ANTIBODY (ROUTINE TESTING W REFLEX): HIV: NONREACTIVE

## 2018-12-03 ENCOUNTER — Ambulatory Visit (HOSPITAL_BASED_OUTPATIENT_CLINIC_OR_DEPARTMENT_OTHER)
Admission: RE | Admit: 2018-12-03 | Discharge: 2018-12-03 | Disposition: A | Discharge status: Home / Self Care | Payer: BLUE CROSS/BLUE SHIELD | Source: Ambulatory Visit | Attending: Family Medicine | Admitting: Family Medicine

## 2018-12-03 DIAGNOSIS — Z1239 Encounter for other screening for malignant neoplasm of breast: Secondary | ICD-10-CM | POA: Diagnosis not present

## 2018-12-03 DIAGNOSIS — Z1231 Encounter for screening mammogram for malignant neoplasm of breast: Secondary | ICD-10-CM | POA: Diagnosis not present

## 2018-12-12 ENCOUNTER — Encounter: Payer: Self-pay | Admitting: Family Medicine

## 2018-12-20 ENCOUNTER — Encounter: Payer: Self-pay | Admitting: Pulmonary Disease

## 2018-12-20 ENCOUNTER — Ambulatory Visit (INDEPENDENT_AMBULATORY_CARE_PROVIDER_SITE_OTHER): Payer: BLUE CROSS/BLUE SHIELD | Admitting: Pulmonary Disease

## 2018-12-20 VITALS — BP 124/82 | HR 84 | Ht 62.0 in | Wt 260.0 lb

## 2018-12-20 DIAGNOSIS — G4733 Obstructive sleep apnea (adult) (pediatric): Secondary | ICD-10-CM
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
## 2018-12-20 NOTE — Patient Instructions (Signed)
History of obstructive sleep apnea  We will set you up with a home sleep study I will see you back in the office in about 3 months  Options of treatment as discussed Sleep Apnea Sleep apnea is a condition in which breathing pauses or becomes shallow during sleep. Episodes of sleep apnea usually last 10 seconds or longer, and they may occur as many as 20 times an hour. Sleep apnea disrupts your sleep and keeps your body from getting the rest that it needs. This condition can increase your risk of certain health problems, including:  Heart attack.  Stroke.  Obesity.  Diabetes.  Heart failure.  Irregular heartbeat. There are three kinds of sleep apnea:  Obstructive sleep apnea. This kind is caused by a blocked or collapsed airway.  Central sleep apnea. This kind happens when the part of the brain that controls breathing does not send the correct signals to the muscles that control breathing.  Mixed sleep apnea. This is a combination of obstructive and central sleep apnea. What are the causes? The most common cause of this condition is a collapsed or blocked airway. An airway can collapse or become blocked if:  Your throat muscles are abnormally relaxed.  Your tongue and tonsils are larger than normal.  You are overweight.  Your airway is smaller than normal. What increases the risk? This condition is more likely to develop in people who:  Are overweight.  Smoke.  Have a smaller than normal airway.  Are elderly.  Are female.  Drink alcohol.  Take sedatives or tranquilizers.  Have a family history of sleep apnea. What are the signs or symptoms? Symptoms of this condition include:  Trouble staying asleep.  Daytime sleepiness and tiredness.  Irritability.  Loud snoring.  Morning headaches.  Trouble concentrating.  Forgetfulness.  Decreased interest in sex.  Unexplained sleepiness.  Mood swings.  Personality changes.  Feelings of  depression.  Waking up often during the night to urinate.  Dry mouth.  Sore throat. How is this diagnosed? This condition may be diagnosed with:  A medical history.  A physical exam.  A series of tests that are done while you are sleeping (sleep study). These tests are usually done in a sleep lab, but they may also be done at home. How is this treated? Treatment for this condition aims to restore normal breathing and to ease symptoms during sleep. It may involve managing health issues that can affect breathing, such as high blood pressure or obesity. Treatment may include:  Sleeping on your side.  Using a decongestant if you have nasal congestion.  Avoiding the use of depressants, including alcohol, sedatives, and narcotics.  Losing weight if you are overweight.  Making changes to your diet.  Quitting smoking.  Using a device to open your airway while you sleep, such as: ? An oral appliance. This is a custom-made mouthpiece that shifts your lower jaw forward. ? A continuous positive airway pressure (CPAP) device. This device delivers oxygen to your airway through a mask. ? A nasal expiratory positive airway pressure (EPAP) device. This device has valves that you put into each nostril. ? A bi-level positive airway pressure (BPAP) device. This device delivers oxygen to your airway through a mask.  Surgery if other treatments do not work. During surgery, excess tissue is removed to create a wider airway. It is important to get treatment for sleep apnea. Without treatment, this condition can lead to:  High blood pressure.  Coronary artery disease.  (Men)  An inability to achieve or maintain an erection (impotence).  Reduced thinking abilities. Follow these instructions at home:  Make any lifestyle changes that your health care provider recommends.  Eat a healthy, well-balanced diet.  Take over-the-counter and prescription medicines only as told by your health care  provider.  Avoid using depressants, including alcohol, sedatives, and narcotics.  Take steps to lose weight if you are overweight.  If you were given a device to open your airway while you sleep, use it only as told by your health care provider.  Do not use any tobacco products, such as cigarettes, chewing tobacco, and e-cigarettes. If you need help quitting, ask your health care provider.  Keep all follow-up visits as told by your health care provider. This is important. Contact a health care provider if:  The device that you received to open your airway during sleep is uncomfortable or does not seem to be working.  Your symptoms do not improve.  Your symptoms get worse. Get help right away if:  You develop chest pain.  You develop shortness of breath.  You develop discomfort in your back, arms, or stomach.  You have trouble speaking.  You have weakness on one side of your body.  You have drooping in your face. These symptoms may represent a serious problem that is an emergency. Do not wait to see if the symptoms will go away. Get medical help right away. Call your local emergency services (911 in the [place] Do not drive yourself to the hospital. This information is not intended to replace advice given to you by your health care provider. Make sure you discuss any questions you have with your health care provider. Document Released: 10/20/2002 Document Revised: 05/28/2017 Document Reviewed: 08/09/2015 Elsevier Interactive Patient Education  2019 [employer].

## 2018-12-25 ENCOUNTER — Ambulatory Visit (INDEPENDENT_AMBULATORY_CARE_PROVIDER_SITE_OTHER): Payer: BLUE CROSS/BLUE SHIELD | Admitting: Obstetrics & Gynecology

## 2018-12-25 ENCOUNTER — Encounter: Payer: Self-pay | Admitting: Family Medicine

## 2018-12-25 ENCOUNTER — Ambulatory Visit (HOSPITAL_BASED_OUTPATIENT_CLINIC_OR_DEPARTMENT_OTHER)
Admission: RE | Admit: 2018-12-25 | Discharge: 2018-12-25 | Disposition: A | Discharge status: Home / Self Care | Payer: BLUE CROSS/BLUE SHIELD | Source: Ambulatory Visit | Attending: Obstetrics & Gynecology | Admitting: Obstetrics & Gynecology

## 2018-12-25 ENCOUNTER — Encounter: Payer: Self-pay | Admitting: Obstetrics & Gynecology

## 2018-12-25 VITALS — BP 125/81 | HR 77 | Ht 62.0 in | Wt 266.0 lb

## 2018-12-25 DIAGNOSIS — Z01419 Encounter for gynecological examination (general) (routine) without abnormal findings: Secondary | ICD-10-CM

## 2018-12-25 DIAGNOSIS — R102 Pelvic and perineal pain: Secondary | ICD-10-CM | POA: Diagnosis not present

## 2018-12-25 DIAGNOSIS — E049 Nontoxic goiter, unspecified: Secondary | ICD-10-CM

## 2018-12-25 NOTE — Progress Notes (Addendum)
Subjective:     Jordan Bush is a 48 y.o. female here for a routine exam. [id number]  SVD 2 TAB x1.  Current complaints: Pt report cramping occ. Pt is s/p hyst in 2018 due to fibroids and adenocarcinoma in situ. Cramping is intermittent. No meds required.     Gynecologic History No LMP recorded. Patient has had an ablation. Contraception: status post hysterectomy Last Pap: 2018. Results were: abnormal Last mammogram: 12/03/2018. Results were: normal  Obstetric History The following portions of the patient's history were reviewed and updated as appropriate: allergies, current medications, past family history, past medical history, past social history, past surgical history and problem list.  Review of Systems Pertinent items are noted in HPI.    Objective:  BP 125/81   Pulse 77   Ht 5\' 2"  (1.575 m)   Wt 266 lb (120.7 kg)   BMI 48.65 kg/m   General Appearance:    Alert, cooperative, no distress, appears stated age  Head:    Normocephalic, without obvious abnormality, atraumatic  Eyes:    conjunctiva/corneas clear, EOM's intact, both eyes  Ears:    Normal external ear canals, both ears  Nose:   Nares normal, septum midline, mucosa normal, no drainage    or sinus tenderness  Throat:   Lips, mucosa, and tongue normal; teeth and gums normal  Neck:   Supple, symmetrical, trachea midline, no adenopathy;    thyroid:  no enlargement/tenderness/nodules; there is a goiter on the left side of the thyroid.    Back:     Symmetric, no curvature, ROM normal, no CVA tenderness  Lungs:     Clear to auscultation bilaterally, respirations unlabored  Chest Wall:    No tenderness or deformity   Heart:    Regular rate and rhythm, S1 and S2 normal, no murmur, rub   or gallop  Breast Exam:    No tenderness, masses, or nipple abnormality  Abdomen:     Soft, non-tender, bowel sounds active all four quadrants,    no masses, no organomegaly  Genitalia:    Normal female without lesion, discharge or tenderness    Vaginal cuff well healed.   Extremities:   Extremities normal, atraumatic, no cyanosis or edema  Pulses:   2+ and symmetric all extremities  Skin:   Skin color, texture, turgor normal, no rashes or lesions    07/03/2017 Diagnosis 1. Cyst, excision, right ovarian cyst wall FOLLICULAR CYSTS NEGATIVE FOR MALIGANNCY 2. Uterus, cervix and bilateral fallopian tubes LOW GRADE SQUAMOUS INTRAEPITHELIAL LESION, CIN 1 PREVIOUS RESECTION SITE CHANGES NO RESIDUAL HIGH GRADE SQUAMOUS INTRAEPITHELIAL LESION AND ENDOCERVICAL ADENOCARCINOMA IN SITU NO INVASIVE NEOPLASM IDENTIFIED MARGINS OF RESECTION ARE NEGATIVE FOR DYSPLASIA ENDOMETRIUM: INACTIVE ENDOMETRIUM MYOMETRIUM: LEIOMYOMAS BILATERAL FALLOPIAN TUBES: HISTOLOGICAL UNREMARKABLE  Assessment:    Healthy female exam.   pelvic cramps.  Enlarged thyroid- suspect goiter- rec f/u with primary care.    Plan:  [name] was seen today for gynecologic exam.  Diagnoses and all orders for this visit:  Pelvic pain -     US Transvaginal Non-OB; Future  Well female exam with routine gynecological exam  f/u 6 week sooner prn to review [name]  Goiter- f/u with primary care provider.   [name], M.D., Cherlynn June

## 2018-12-30 ENCOUNTER — Other Ambulatory Visit: Payer: Self-pay

## 2018-12-30 DIAGNOSIS — N83201 Unspecified ovarian cyst, right side: Secondary | ICD-10-CM

## 2019-01-15 ENCOUNTER — Ambulatory Visit: Payer: BLUE CROSS/BLUE SHIELD | Admitting: Obstetrics & Gynecology

## 2019-01-17 DIAGNOSIS — G4733 Obstructive sleep apnea (adult) (pediatric): Secondary | ICD-10-CM

## 2019-01-18 DIAGNOSIS — G4733 Obstructive sleep apnea (adult) (pediatric): Secondary | ICD-10-CM | POA: Diagnosis not present

## 2019-01-21 ENCOUNTER — Telehealth: Payer: Self-pay | Admitting: Pulmonary Disease

## 2019-01-21 DIAGNOSIS — G4733 Obstructive sleep apnea (adult) (pediatric): Secondary | ICD-10-CM | POA: Diagnosis not present

## 2019-01-21 NOTE — Telephone Encounter (Signed)
Dr. Ander Slade has reviewed the home sleep test this showed Moderate OSA and Mild Oxygen desaturations AHI 18.9, lowest O2 was 84%.   Recommendations   Treatment options are CPAP with the settings auto 5 to 15cm, mask of choice and supplies.    Weight loss measures encouraged.   Advise against driving while sleepy & against medication with sedative side effects.    Please make appointment for 3 months for compliance with download with Dr. Ander Slade.

## 2019-01-21 NOTE — Telephone Encounter (Signed)
Called and spoke with patient regarding results.  Informed the patient of results and recommendations today. Placed order for CPAP with the settings auto 5 to 15cm, mask of choice and supplies.  Scheduled appt with AO for 04/15/2019 at 430pm Pt verbalized understanding and denied any questions or concerns at this time.  Nothing further needed.

## 2019-01-23 ENCOUNTER — Encounter: Payer: Self-pay | Admitting: Family Medicine

## 2019-01-24 MED ORDER — FLUTICASONE PROPIONATE 50 MCG/ACT NA SUSP: 2.0000 | Freq: Every day | NASAL | 5 refills | Status: DC

## 2019-02-05 ENCOUNTER — Inpatient Hospital Stay (HOSPITAL_BASED_OUTPATIENT_CLINIC_OR_DEPARTMENT_OTHER): Admission: RE | Admit: 2019-02-05 | Payer: BLUE CROSS/BLUE SHIELD | Source: Ambulatory Visit

## 2019-02-05 DIAGNOSIS — G4733 Obstructive sleep apnea (adult) (pediatric): Secondary | ICD-10-CM | POA: Diagnosis not present

## 2019-02-13 ENCOUNTER — Ambulatory Visit: Payer: BLUE CROSS/BLUE SHIELD | Admitting: Obstetrics & Gynecology

## 2019-02-28 IMAGING — MG DIGITAL SCREENING BILATERAL MAMMOGRAM WITH TOMO AND CAD
6 of 10 series · 6 of 30 positions shown · non-contrast
Comparison: Previous exam(s).

CLINICAL DATA: Screening.

EXAM:
DIGITAL SCREENING BILATERAL MAMMOGRAM WITH TOMO AND CAD

[L CC synth-2D]
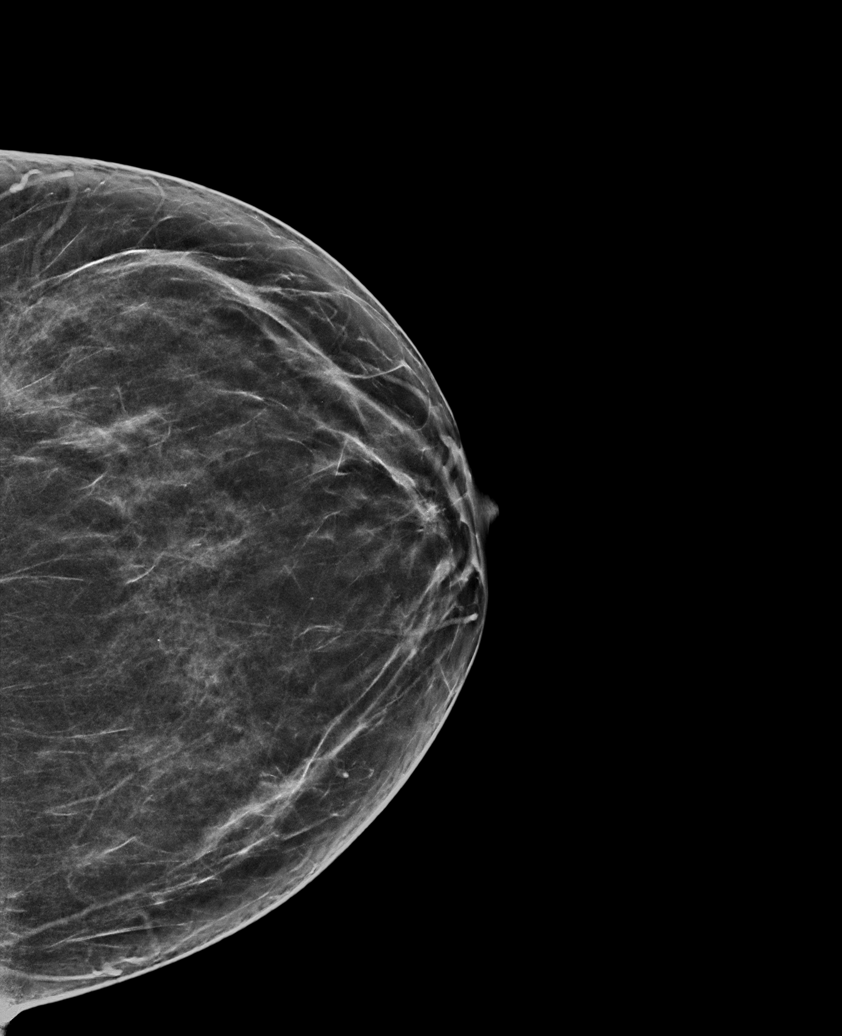

[L MLO synth-2D]
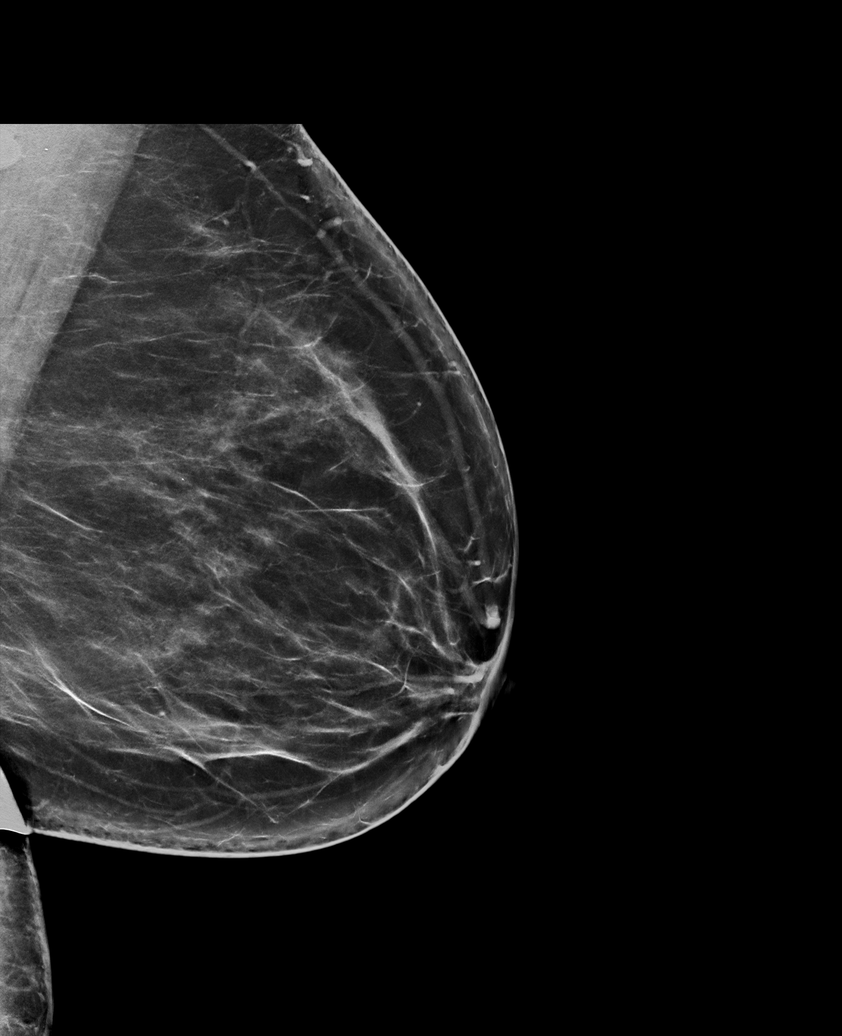

[R CC synth-2D]
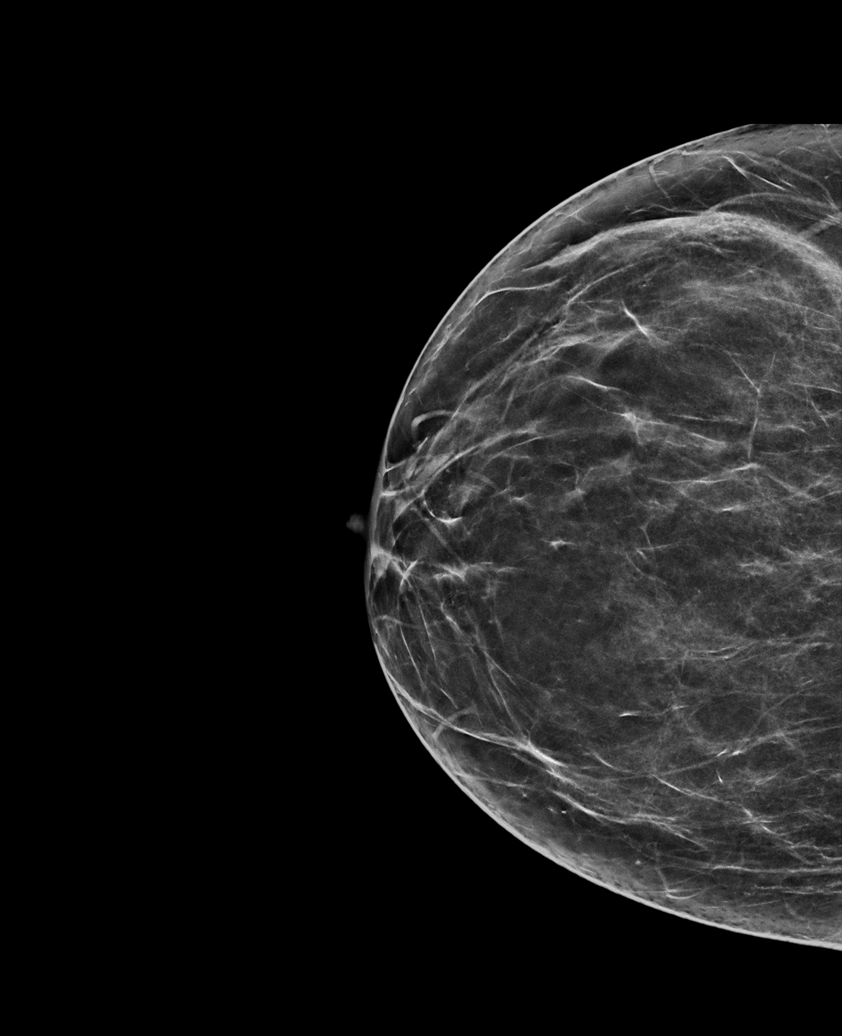

[R XCCL synth-2D]
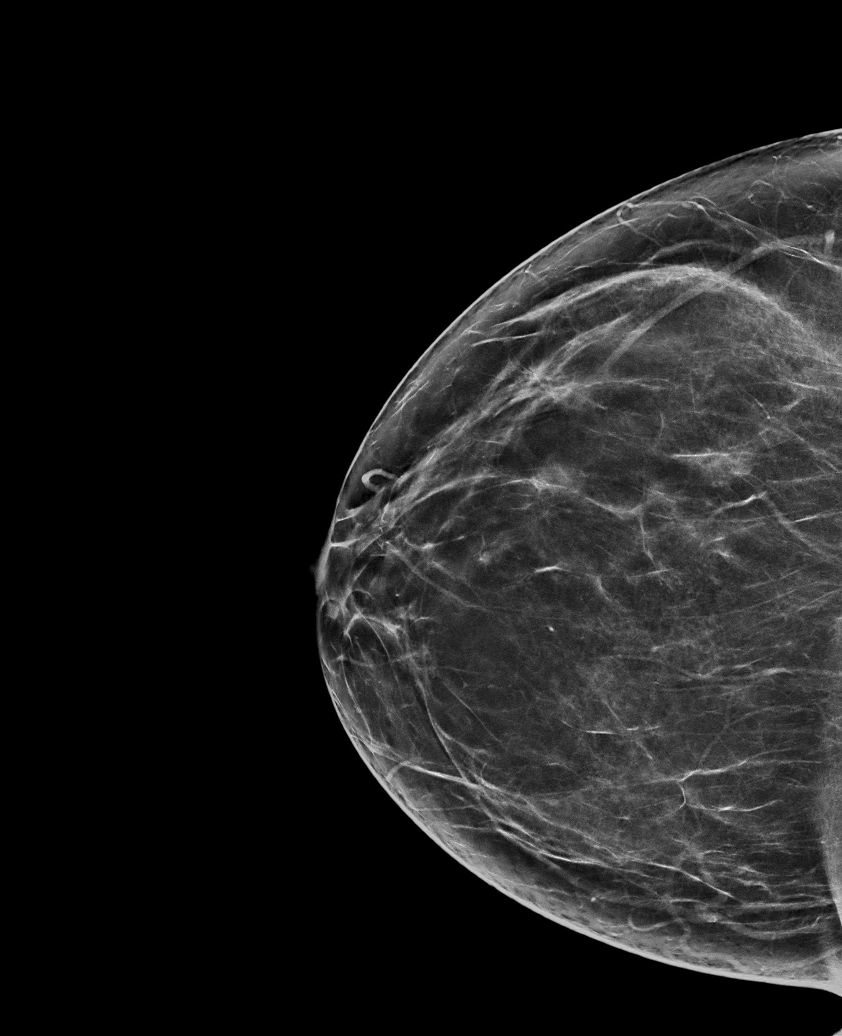

[R MLO synth-2D]
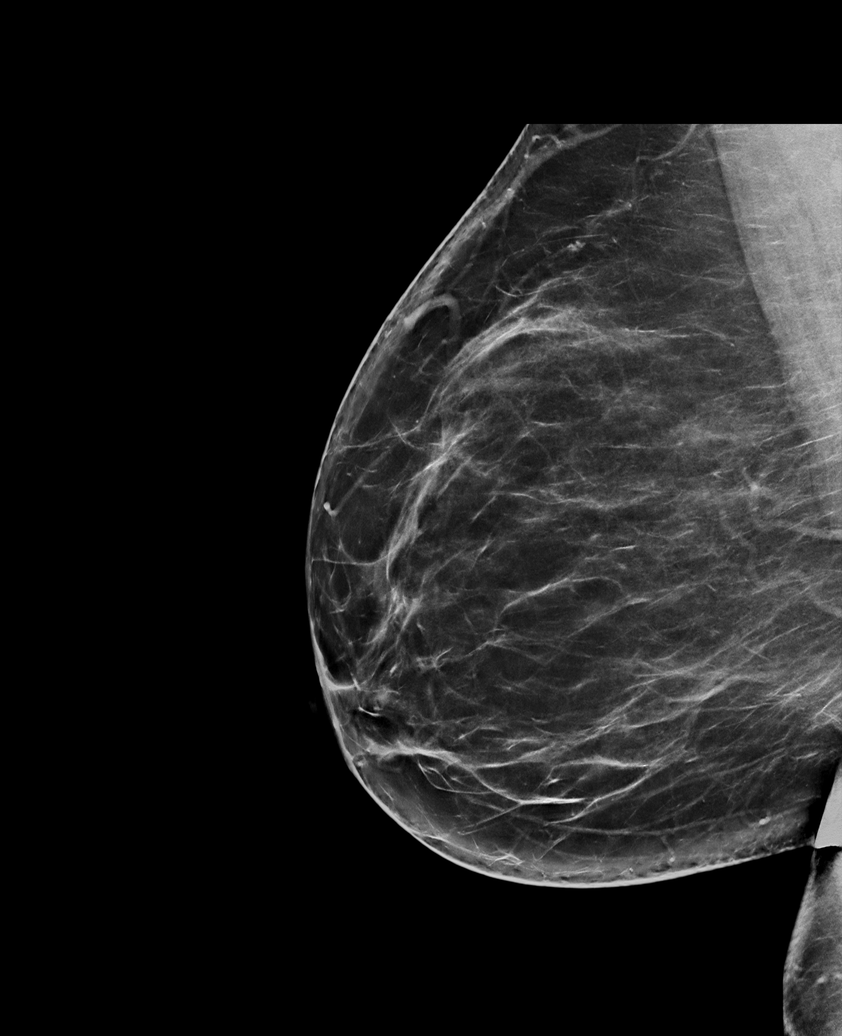

[R CC tomo · tomo slice 33/66.0]
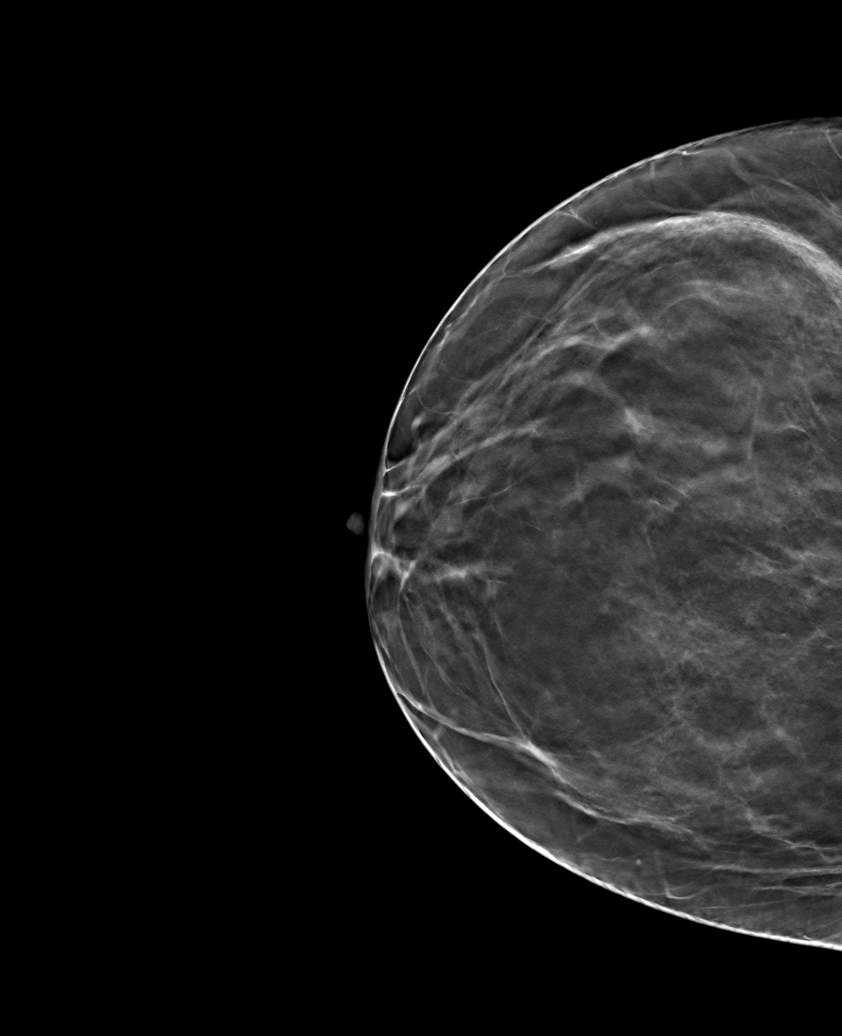

[6 of 30 positions shown; findings below may reference images not displayed]

ACR Breast Density Category b: There are scattered areas of
fibroglandular density.
FINDINGS: There are no findings suspicious for malignancy. Images were
processed with CAD.
IMPRESSION: No mammographic evidence of malignancy. A result letter of this
screening mammogram will be mailed directly to the patient.

RECOMMENDATION:
Screening mammogram in one year. (Code:[id number])

BI-RADS CATEGORY  1: Negative.

## 2019-03-08 DIAGNOSIS — G4733 Obstructive sleep apnea (adult) (pediatric): Secondary | ICD-10-CM | POA: Diagnosis not present

## 2019-03-12 ENCOUNTER — Ambulatory Visit (HOSPITAL_BASED_OUTPATIENT_CLINIC_OR_DEPARTMENT_OTHER): Payer: BLUE CROSS/BLUE SHIELD

## 2019-03-19 ENCOUNTER — Encounter: Payer: Self-pay | Admitting: Family Medicine

## 2019-03-19 ENCOUNTER — Other Ambulatory Visit: Payer: Self-pay | Admitting: Family Medicine

## 2019-03-19 DIAGNOSIS — I10 Essential (primary) hypertension: Secondary | ICD-10-CM

## 2019-03-19 MED ORDER — AMLODIPINE BESYLATE 5 MG PO TABS: 5.0000 mg | ORAL_TABLET | Freq: Every day | ORAL | 1 refills | Status: DC

## 2019-03-21 ENCOUNTER — Ambulatory Visit: Payer: BLUE CROSS/BLUE SHIELD | Admitting: Pulmonary Disease

## 2019-04-07 DIAGNOSIS — G4733 Obstructive sleep apnea (adult) (pediatric): Secondary | ICD-10-CM | POA: Diagnosis not present

## 2019-04-15 ENCOUNTER — Ambulatory Visit: Payer: BLUE CROSS/BLUE SHIELD | Admitting: Pulmonary Disease

## 2019-04-17 ENCOUNTER — Ambulatory Visit (HOSPITAL_BASED_OUTPATIENT_CLINIC_OR_DEPARTMENT_OTHER)
Admission: RE | Admit: 2019-04-17 | Discharge: 2019-04-17 | Disposition: A | Discharge status: Home / Self Care | Payer: BC Managed Care – PPO | Source: Ambulatory Visit | Attending: Obstetrics & Gynecology | Admitting: Obstetrics & Gynecology

## 2019-04-17 ENCOUNTER — Other Ambulatory Visit: Payer: Self-pay

## 2019-04-17 DIAGNOSIS — N83201 Unspecified ovarian cyst, right side: Secondary | ICD-10-CM | POA: Diagnosis not present

## 2019-04-17 DIAGNOSIS — N83202 Unspecified ovarian cyst, left side: Secondary | ICD-10-CM | POA: Diagnosis not present

## 2019-04-17 DIAGNOSIS — Z90710 Acquired absence of both cervix and uterus: Secondary | ICD-10-CM | POA: Diagnosis not present

## 2019-04-28 ENCOUNTER — Telehealth: Payer: Self-pay

## 2019-04-28 NOTE — Telephone Encounter (Signed)
Patient calling to check on her earlier my chart sent to Dr. Ihor Dow. Patient states that the imaging says she has a cyst and she is very nervous about this result. Placed patient on scheduled to discuss with Dr Ihor Dow and assured her I would send the provider another note. Patient states understanding. Kathrene Alu RN

## 2019-04-30 ENCOUNTER — Encounter: Payer: Self-pay | Admitting: Obstetrics & Gynecology

## 2019-04-30 ENCOUNTER — Other Ambulatory Visit: Payer: Self-pay

## 2019-04-30 ENCOUNTER — Ambulatory Visit (INDEPENDENT_AMBULATORY_CARE_PROVIDER_SITE_OTHER): Payer: BC Managed Care – PPO | Admitting: Obstetrics & Gynecology

## 2019-04-30 VITALS — BP 132/86 | HR 73 | Ht 62.0 in | Wt 272.0 lb

## 2019-04-30 DIAGNOSIS — N83202 Unspecified ovarian cyst, left side: Secondary | ICD-10-CM | POA: Diagnosis not present

## 2019-04-30 DIAGNOSIS — N9489 Other specified conditions associated with female genital organs and menstrual cycle: Secondary | ICD-10-CM | POA: Diagnosis not present

## 2019-04-30 NOTE — Progress Notes (Signed)
History:  48 y.o. [id number] here today for results of [name]. Pt is worried after reading her results on EPIC. She denies sx. She initially had pain but, is not currently in pain. No weight loss. No FH of breast or OV cancer.       The following portions of the patient's history were reviewed and updated as appropriate: allergies, current medications, past family history, past medical history, past social history, past surgical history and problem list.  Review of Systems:  Pertinent items are noted in HPI.    Objective:  Physical Exam Blood pressure 132/86, pulse 73, height 5\' 2"  (1.575 m), weight 272 lb (123.4 kg).  CONSTITUTIONAL: Well-developed, well-nourished female in no acute distress.  HENT:  Normocephalic, atraumatic EYES: Conjunctivae and EOM are normal. No scleral icterus.  NECK: Normal range of motion SKIN: Skin is warm and dry. No rash noted. Not diaphoretic.No pallor. [place]: Alert and oriented to person, place, and time. Normal coordination.  Pelvic: not repeated.    Labs and Imaging US Pelvis Transvanginal Non-ob (tv Only)  Result Date: 04/17/2019 CLINICAL DATA:  Follow-up examination for left ovarian cyst. EXAM: ULTRASOUND PELVIS TRANSVAGINAL TECHNIQUE: Transvaginal ultrasound examination of the pelvis was performed including evaluation of the uterus, ovaries, adnexal regions, and pelvic cul-de-sac. COMPARISON:  Prior ultrasound from 12/25/2018 FINDINGS: Uterus Surgically absent.  No abnormality about the vaginal cuff. Endometrium Surgically absent. Right ovary Measurements: 2.6 x 1.3 x 1.5 cm = volume: 2.7 mL. Normal appearance/no adnexal mass. Left ovary Measurements: 4.1 x 3.1 x 3.5 cm = volume: 23 mL. 2.9 x 2.2 x 2.8 cm hypoechoic mildly complex cystic lesion again seen within the left ovary this measures increased in size on today's exam (previously 1.3 cm). Cyst is mildly complex with few scattered low-level internal echoes with possible single thin septation. Possible  associated vascularity along its peripheral margins, although evaluation somewhat limited by lack of cine clips. Other findings:  No abnormal free fluid IMPRESSION: 1. Persistent mildly complex hypoechoic left ovarian cyst, measuring up to 2.9 cm on today's exam, increased in size from previous (previously 1.3 cm). Finding is indeterminate, and surgical consultation is recommended. Additionally, further imaging with dedicated pelvic MRI could be performed for further evaluation as clinically warranted. 2. Normal right ovary. No right adnexal mass or free fluid within the pelvis. 3. Prior hysterectomy. Electronically Signed   By: Jeannine Boga M.D.   On: 04/17/2019 21:57    Assessment & Plan:  Left adnexal mass- d/w pt risk or benign vs malignant process. Review potential management strategies.   repeat US in 6 weeks  ROMA   F/u in 8 weeks  Total face-to-face time with patient was 15 min.  Greater than 50% was spent in counseling and coordination of care with the patient.   Leilah Polimeni L. Harraway-Smith, M.D., Cherlynn June

## 2019-05-01 DIAGNOSIS — N83202 Unspecified ovarian cyst, left side: Secondary | ICD-10-CM | POA: Diagnosis not present

## 2019-05-02 ENCOUNTER — Encounter: Payer: Self-pay | Admitting: Obstetrics & Gynecology

## 2019-05-02 LAB — OVARIAN MALIGNANCY RISK-ROMA
Cancer Antigen (CA) 125: 8 U/mL (ref 0.0–38.1)
HE4: 42.2 pmol/L (ref 0.0–63.6)
Postmenopausal ROMA: 0.64
Premenopausal ROMA: 0.49

## 2019-05-02 LAB — POSTMENOPAUSAL INTERP: LOW

## 2019-05-02 LAB — PREMENOPAUSAL INTERP: LOW

## 2019-05-03 ENCOUNTER — Other Ambulatory Visit: Payer: Self-pay | Admitting: Family Medicine

## 2019-05-03 DIAGNOSIS — J4520 Mild intermittent asthma, uncomplicated: Secondary | ICD-10-CM

## 2019-05-08 DIAGNOSIS — G4733 Obstructive sleep apnea (adult) (pediatric): Secondary | ICD-10-CM | POA: Diagnosis not present

## 2019-05-12 ENCOUNTER — Encounter: Payer: Self-pay | Admitting: Family Medicine

## 2019-05-12 ENCOUNTER — Ambulatory Visit: Payer: BC Managed Care – PPO | Admitting: Obstetrics & Gynecology

## 2019-05-24 DIAGNOSIS — S134XXA Sprain of ligaments of cervical spine, initial encounter: Secondary | ICD-10-CM | POA: Diagnosis not present

## 2019-05-27 DIAGNOSIS — S134XXA Sprain of ligaments of cervical spine, initial encounter: Secondary | ICD-10-CM | POA: Diagnosis not present

## 2019-05-30 ENCOUNTER — Other Ambulatory Visit: Payer: Self-pay

## 2019-05-30 ENCOUNTER — Ambulatory Visit: Payer: BLUE CROSS/BLUE SHIELD | Admitting: Family Medicine

## 2019-05-30 ENCOUNTER — Encounter: Payer: Self-pay | Admitting: Family Medicine

## 2019-05-30 DIAGNOSIS — L500 Allergic urticaria: Secondary | ICD-10-CM

## 2019-05-30 DIAGNOSIS — I10 Essential (primary) hypertension: Secondary | ICD-10-CM | POA: Diagnosis not present

## 2019-05-30 DIAGNOSIS — J3089 Other allergic rhinitis: Secondary | ICD-10-CM | POA: Diagnosis not present

## 2019-05-30 MED ORDER — LEVOCETIRIZINE DIHYDROCHLORIDE 5 MG PO TABS: 5.0000 mg | ORAL_TABLET | Freq: Every day | ORAL | 3 refills | Status: DC

## 2019-05-30 MED ORDER — AMLODIPINE BESYLATE 5 MG PO TABS: 5.0000 mg | ORAL_TABLET | Freq: Every day | ORAL | 3 refills | Status: DC

## 2019-05-30 MED ORDER — CHLORTHALIDONE 25 MG PO TABS: ORAL_TABLET | ORAL | 3 refills | Status: DC

## 2019-05-30 NOTE — Patient Instructions (Addendum)
Goal weight in January 2021- 250-255 lbs.  Keep the diet clean and stay active.  Try to meal prep when able.  Aim to do some physical exertion for 150 minutes per week. This is typically divided into 5 days per week, 30 minutes per day. The activity should be enough to get your heart rate up. Anything is better than nothing if you have time constraints.  If you do not hear anything about your referral in the next 1-2 weeks, call our office and ask for an update.  Let us know if you need anything.
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
## 2019-06-02 DIAGNOSIS — S134XXA Sprain of ligaments of cervical spine, initial encounter: Secondary | ICD-10-CM | POA: Diagnosis not present

## 2019-06-07 DIAGNOSIS — S134XXA Sprain of ligaments of cervical spine, initial encounter: Secondary | ICD-10-CM | POA: Diagnosis not present

## 2019-06-11 ENCOUNTER — Ambulatory Visit (HOSPITAL_BASED_OUTPATIENT_CLINIC_OR_DEPARTMENT_OTHER)
Admission: RE | Admit: 2019-06-11 | Discharge: 2019-06-11 | Disposition: A | Discharge status: Home / Self Care | Payer: BC Managed Care – PPO | Source: Ambulatory Visit | Attending: Obstetrics & Gynecology | Admitting: Obstetrics & Gynecology

## 2019-06-11 ENCOUNTER — Other Ambulatory Visit: Payer: Self-pay

## 2019-06-11 DIAGNOSIS — N83202 Unspecified ovarian cyst, left side: Secondary | ICD-10-CM | POA: Diagnosis not present

## 2019-06-11 DIAGNOSIS — N8301 Follicular cyst of right ovary: Secondary | ICD-10-CM | POA: Diagnosis not present

## 2019-06-14 DIAGNOSIS — S134XXA Sprain of ligaments of cervical spine, initial encounter: Secondary | ICD-10-CM | POA: Diagnosis not present

## 2019-06-21 DIAGNOSIS — S134XXA Sprain of ligaments of cervical spine, initial encounter: Secondary | ICD-10-CM | POA: Diagnosis not present

## 2019-06-28 DIAGNOSIS — S134XXA Sprain of ligaments of cervical spine, initial encounter: Secondary | ICD-10-CM | POA: Diagnosis not present

## 2019-07-12 DIAGNOSIS — S134XXA Sprain of ligaments of cervical spine, initial encounter: Secondary | ICD-10-CM | POA: Diagnosis not present

## 2019-08-02 DIAGNOSIS — S134XXA Sprain of ligaments of cervical spine, initial encounter: Secondary | ICD-10-CM | POA: Diagnosis not present

## 2019-08-08 DIAGNOSIS — G4733 Obstructive sleep apnea (adult) (pediatric): Secondary | ICD-10-CM | POA: Diagnosis not present

## 2019-08-11 ENCOUNTER — Encounter: Payer: Self-pay | Admitting: Family Medicine

## 2019-08-11 DIAGNOSIS — I10 Essential (primary) hypertension: Secondary | ICD-10-CM

## 2019-08-11 MED ORDER — AMLODIPINE BESYLATE 5 MG PO TABS: 5.0000 mg | ORAL_TABLET | Freq: Every day | ORAL | 0 refills | Status: DC

## 2019-08-18 DIAGNOSIS — S134XXA Sprain of ligaments of cervical spine, initial encounter: Secondary | ICD-10-CM | POA: Diagnosis not present

## 2019-08-25 ENCOUNTER — Ambulatory Visit: Payer: Self-pay | Admitting: Family Medicine

## 2019-08-25 NOTE — Telephone Encounter (Signed)
  Reason for Disposition . [1] After 1 week AND [2] not using the finger normally  Answer Assessment - Initial Assessment Questions 1. MECHANISM: "How did the injury happen?"      [place] out of shower. 2. ONSET: "When did the injury happen?" (Minutes or hours ago)      About a month ago 3. LOCATION: "What part of the finger is injured?" "Is the nail damaged?"      Pinky, left hand 4. APPEARANCE of the INJURY: "What does the injury look like?"      Swollen first joint 5. SEVERITY: "Can you use the hand normally?"  "Can you bend your fingers into a ball and then fully open them?"     Can make a fist and use hand but hurts to do so 6. SIZE: For cuts, bruises, or swelling, ask: "How large is it?" (e.g., inches or centimeters;  entire finger)      Swelling that is noticeable if compared to the other hand 7. PAIN: "Is there pain?" If so, ask: "How bad is the pain?"    (e.g., Scale 1-10; or mild, moderate, severe)     yes 8. TETANUS: For any breaks in the skin, ask: "When was the last tetanus booster?"     Did not break skin 9. OTHER SYMPTOMS: "Do you have any other symptoms?"     no 10. PREGNANCY: "Is there any chance you are pregnant?" "When was your last menstrual period?"  Protocols used: FINGER INJURY-A-AH

## 2019-08-25 NOTE — Telephone Encounter (Addendum)
Patient fell a week ago and her finger was swollen after fall. Patient would like to speak with nurse. Please advise      Attempted to call patient regarding above message. She is working right now. Advised to call the office back.  Patient stated that she fell about a month ago out of a shower while at friend's wedding. She said that it has hurt the whole time and it had been discolored but not now. It is hard to use her hand. It is the pinky on the left hand. The first joint is swollen. She is taking Tylenol for the pain and helps somewhat.  She is requesting an appointment with Dr. Nani Ravens. Per protocol she should be seen within 3 days. Appointment scheduled for tomorrow afternoon per pt request. Routing to LB at [facility] for review.

## 2019-08-26 ENCOUNTER — Ambulatory Visit: Payer: BC Managed Care – PPO | Admitting: Family Medicine

## 2019-08-26 ENCOUNTER — Other Ambulatory Visit: Payer: Self-pay

## 2019-08-26 ENCOUNTER — Encounter: Payer: Self-pay | Admitting: Family Medicine

## 2019-08-26 ENCOUNTER — Ambulatory Visit (HOSPITAL_BASED_OUTPATIENT_CLINIC_OR_DEPARTMENT_OTHER)
Admission: RE | Admit: 2019-08-26 | Discharge: 2019-08-26 | Disposition: A | Discharge status: Home / Self Care | Payer: BC Managed Care – PPO | Source: Ambulatory Visit | Attending: Family Medicine | Admitting: Family Medicine

## 2019-08-26 VITALS — BP 112/80 | HR 99 | Temp 98.0°F | Ht 62.0 in | Wt 276.4 lb

## 2019-08-26 DIAGNOSIS — M25511 Pain in right shoulder: Secondary | ICD-10-CM | POA: Diagnosis not present

## 2019-08-26 DIAGNOSIS — M79645 Pain in left finger(s): Secondary | ICD-10-CM | POA: Insufficient documentation

## 2019-08-26 DIAGNOSIS — G8929 Other chronic pain: Secondary | ICD-10-CM | POA: Diagnosis not present
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
## 2019-08-26 NOTE — Patient Instructions (Signed)
Alternate heat and cold (10 min of each) a few times, twice daily when able.  We will be in touch regarding your X-ray. If normal, give it a few more weeks. If no improvement, we can set you up with OT. This can be a very nagging injury.  OK to take Tylenol 1000 mg (2 extra strength tabs) or 975 mg (3 regular strength tabs) every 6 hours as needed.  Keep moving.  Consider buddy taping your little finger to your ring finger.  Try these stretches for your shoulder. If no improvement, we can get you set up with PT.   EXERCISES  RANGE OF MOTION (ROM) AND STRETCHING EXERCISES These exercises may help you when beginning to rehabilitate your injury. While completing these exercises, remember:   Restoring tissue flexibility helps normal motion to return to the joints. This allows healthier, less painful movement and activity.  An effective stretch should be held for at least 30 seconds.  A stretch should never be painful. You should only feel a gentle lengthening or release in the stretched tissue.  ROM - Pendulum  Bend at the waist so that your right / left arm falls away from your body. Support yourself with your opposite hand on a solid surface, such as a table or a countertop.  Your right / left arm should be perpendicular to the ground. If it is not perpendicular, you need to lean over farther. Relax the muscles in your right / left arm and shoulder as much as possible.  Gently sway your hips and trunk so they move your right / left arm without any use of your right / left shoulder muscles.  Progress your movements so that your right / left arm moves side to side, then forward and backward, and finally, both clockwise and counterclockwise.  Complete 10-15 repetitions in each direction. Many people use this exercise to relieve discomfort in their shoulder as well as to gain range of motion. Repeat 2 times. Complete this exercise 3 times per week.  STRETCH - Flexion, Standing  Stand  with good posture. With an underhand grip on your right / left hand and an overhand grip on the opposite hand, grasp a broomstick or cane so that your hands are a little more than shoulder-width apart.  Keeping your right / left elbow straight and shoulder muscles relaxed, push the stick with your opposite hand to raise your right / left arm in front of your body and then overhead. Raise your arm until you feel a stretch in your right / left shoulder, but before you have increased shoulder pain.  Try to avoid shrugging your right / left shoulder as your arm rises by keeping your shoulder blade tucked down and toward your mid-back spine. Hold 30 seconds.  Slowly return to the starting position. Repeat 2 times. Complete this exercise 3 times per week.  STRETCH - Internal Rotation  Place your right / left hand behind your back, palm-up.  Throw a towel or belt over your opposite shoulder. Grasp the towel/belt with your right / left hand.  While keeping an upright posture, gently pull up on the towel/belt until you feel a stretch in the front of your right / left shoulder.  Avoid shrugging your right / left shoulder as your arm rises by keeping your shoulder blade tucked down and toward your mid-back spine.  Hold 30. Release the stretch by lowering your opposite hand. Repeat 2 times. Complete this exercise 3 times per week.  STRETCH -  External Rotation and Abduction  Stagger your stance through a doorframe. It does not matter which foot is forward.  As instructed by your physician, physical therapist or athletic trainer, place your hands: ? And forearms above your head and on the door frame. ? And forearms at head-height and on the door frame. ? At elbow-height and on the door frame.  Keeping your head and chest upright and your stomach muscles tight to prevent over-extending your low-back, slowly shift your weight onto your front foot until you feel a stretch across your chest and/or in  the front of your shoulders.  Hold 30 seconds. Shift your weight to your back foot to release the stretch. Repeat 2 times. Complete this stretch 3 times per week.   STRENGTHENING EXERCISES  These exercises may help you when beginning to rehabilitate your injury. They may resolve your symptoms with or without further involvement from your physician, physical therapist or athletic trainer. While completing these exercises, remember:   Muscles can gain both the endurance and the strength needed for everyday activities through controlled exercises.  Complete these exercises as instructed by your physician, physical therapist or athletic trainer. Progress the resistance and repetitions only as guided.  You may experience muscle soreness or fatigue, but the pain or discomfort you are trying to eliminate should never worsen during these exercises. If this pain does worsen, stop and make certain you are following the directions exactly. If the pain is still present after adjustments, discontinue the exercise until you can discuss the trouble with your clinician.  If advised by your physician, during your recovery, avoid activity or exercises which involve actions that place your right / left hand or elbow above your head or behind your back or head. These positions stress the tissues which are trying to heal.  STRENGTH - Scapular Depression and Adduction  With good posture, sit on a firm chair. Supported your arms in front of you with pillows, arm rests or a table top. Have your elbows in line with the sides of your body.  Gently draw your shoulder blades down and toward your mid-back spine. Gradually increase the tension without tensing the muscles along the top of your shoulders and the back of your neck.  Hold for 3 seconds. Slowly release the tension and relax your muscles completely before completing the next repetition.  After you have practiced this exercise, remove the arm support and  complete it in standing as well as sitting. Repeat 2 times. Complete this exercise 3 times per week.   STRENGTH - External Rotators  Secure a rubber exercise band/tubing to a fixed object so that it is at the same height as your right / left elbow when you are standing or sitting on a firm surface.  Stand or sit so that the secured exercise band/tubing is at your side that is not injured.  Bend your elbow 90 degrees. Place a folded towel or small pillow under your right / left arm so that your elbow is a few inches away from your side.  Keeping the tension on the exercise band/tubing, pull it away from your body, as if pivoting on your elbow. Be sure to keep your body steady so that the movement is only coming from your shoulder rotating.  Hold 3 seconds. Release the tension in a controlled manner as you return to the starting position. Repeat 2 times. Complete this exercise 3 times per week.   STRENGTH - Supraspinatus  Stand or sit with good  posture. Grasp a 2-3 lb weight or an exercise band/tubing so that your hand is "thumbs-up," like when you shake hands.  Slowly lift your right / left hand from your thigh into the air, traveling about 30 degrees from straight out at your side. Lift your hand to shoulder height or as far as you can without increasing any shoulder pain. Initially, many people do not lift their hands above shoulder height.  Avoid shrugging your right / left shoulder as your arm rises by keeping your shoulder blade tucked down and toward your mid-back spine.  Hold for 3 seconds. Control the descent of your hand as you slowly return to your starting position. Repeat 2 times. Complete this exercise 3 times per week.   STRENGTH - Shoulder Extensors  Secure a rubber exercise band/tubing so that it is at the height of your shoulders when you are either standing or sitting on a firm arm-less chair.  With a thumbs-up grip, grasp an end of the band/tubing in each hand.  Straighten your elbows and lift your hands straight in front of you at shoulder height. Step back away from the secured end of band/tubing until it becomes tense.  Squeezing your shoulder blades together, pull your hands down to the sides of your thighs. Do not allow your hands to go behind you.  Hold for 3 seconds. Slowly ease the tension on the band/tubing as you reverse the directions and return to the starting position. Repeat 2 times. Complete this exercise 3 times per week.   STRENGTH - Scapular Retractors  Secure a rubber exercise band/tubing so that it is at the height of your shoulders when you are either standing or sitting on a firm arm-less chair.  With a palm-down grip, grasp an end of the band/tubing in each hand. Straighten your elbows and lift your hands straight in front of you at shoulder height. Step back away from the secured end of band/tubing until it becomes tense.  Squeezing your shoulder blades together, draw your elbows back as you bend them. Keep your upper arm lifted away from your body throughout the exercise.  Hold 3 seconds. Slowly ease the tension on the band/tubing as you reverse the directions and return to the starting position. Repeat 2 times. Complete this exercise 3 times per week.  STRENGTH - Scapular Depressors  Find a sturdy chair without wheels, such as a from a dining room table.  Keeping your feet on the floor, lift your bottom from the seat and lock your elbows.  Keeping your elbows straight, allow gravity to pull your body weight down. Your shoulders will rise toward your ears.  Raise your body against gravity by drawing your shoulder blades down your back, shortening the distance between your shoulders and ears. Although your feet should always maintain contact with the floor, your feet should progressively support less body weight as you get stronger.  Hold 3 seconds. In a controlled and slow manner, lower your body weight to begin the next  repetition. Repeat 2 times. Complete this exercise 3 times per week.   This information is not intended to replace advice given to you by your health care provider. Make sure you discuss any questions you have with your health care provider.  Document Released: 09/13/2005 Document Revised: 11/20/2014 Document Reviewed: 02/11/2009 Elsevier Interactive Patient Education [employer].

## 2019-08-26 NOTE — Telephone Encounter (Signed)
appt scheduled for today

## 2019-09-06 IMAGING — US TRANSVAGINAL ULTRASOUND OF PELVIS
1 series · 14 of 22 positions shown · non-contrast
Comparison: 04/17/2019

CLINICAL DATA: Follow-up LEFT ovarian cyst

EXAM:
ULTRASOUND PELVIS TRANSVAGINAL
TECHNIQUE: Transvaginal ultrasound examination of the pelvis was performed
including evaluation of the uterus, ovaries, adnexal regions, and
pelvic cul-de-sac.

[Series 1: transvaginal ultrasound of pelvis · 22 acquisitions, 14 frames shown]
[im 1/22]
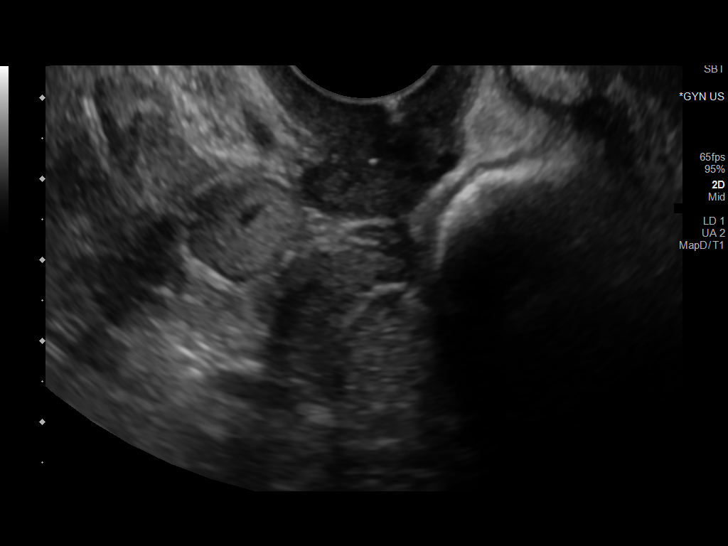
[im 3/22]
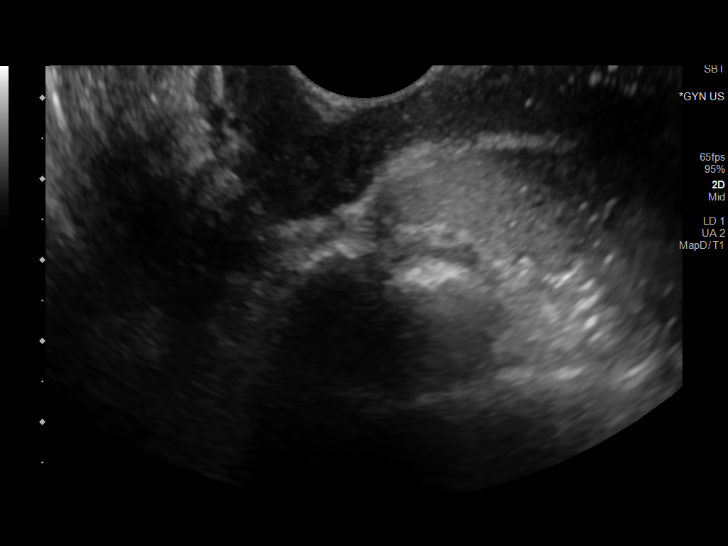
[im 4/22]
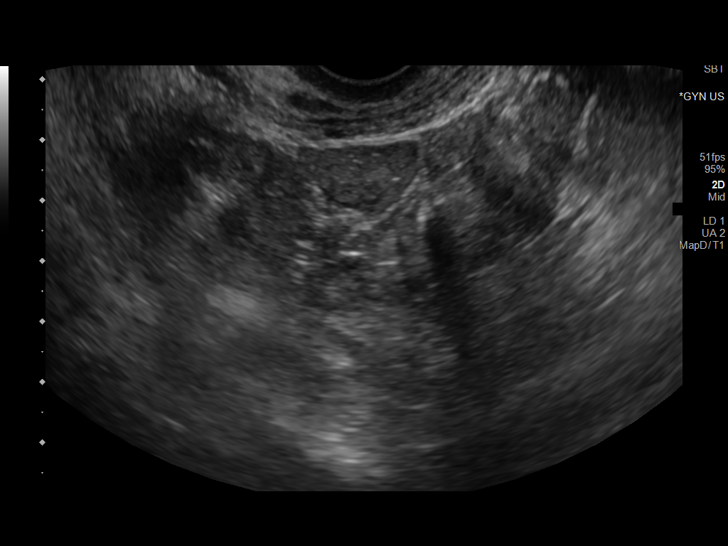
[im 6/22]
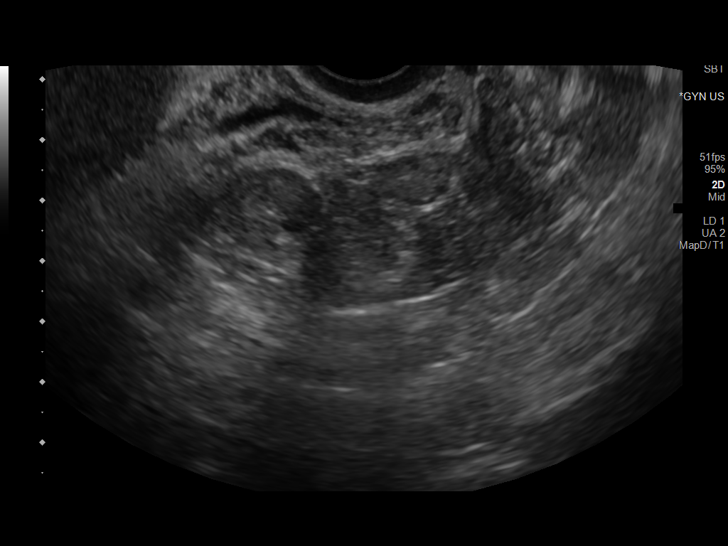
[im 8/22]
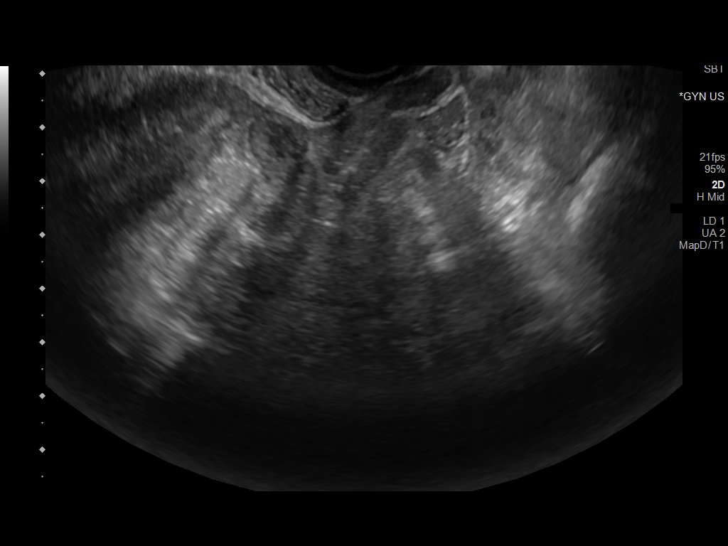
[im 9/22]
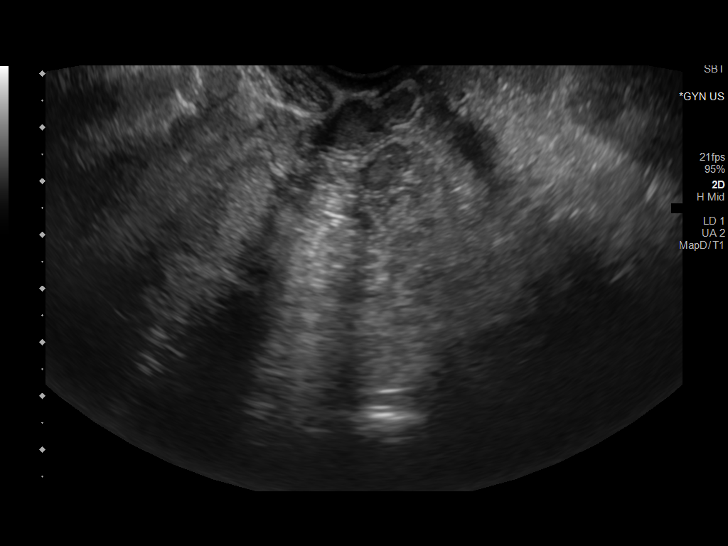
[im 11/22]
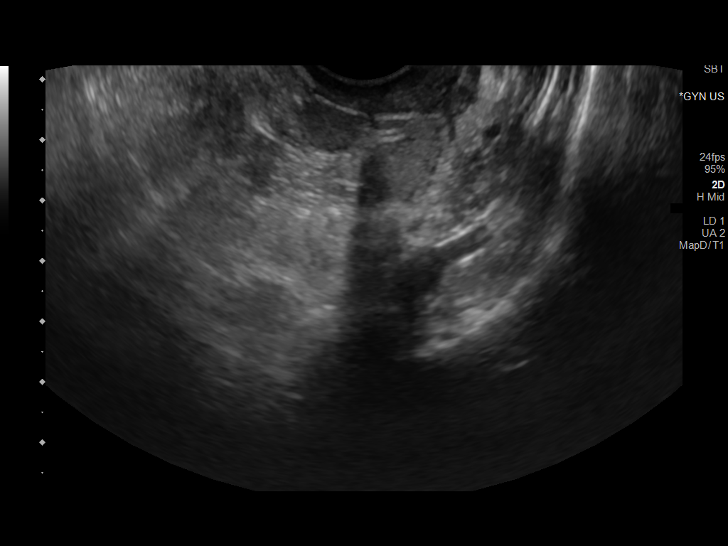
[im 12/22]
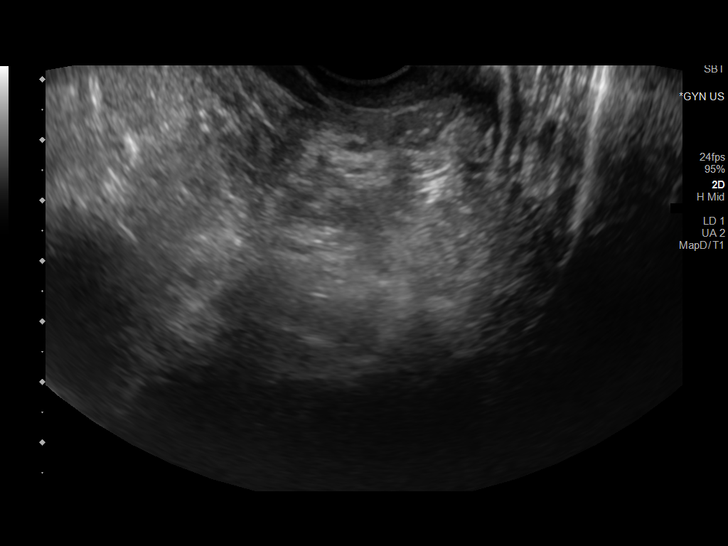
[im 14/22]
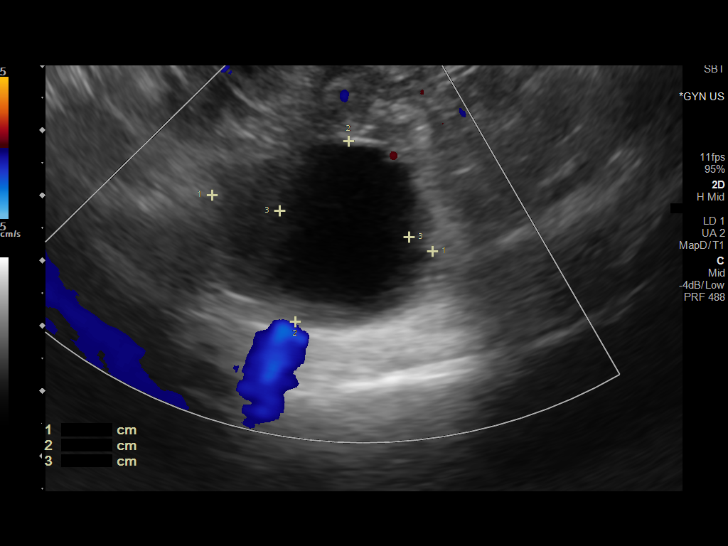
[im 15/22]
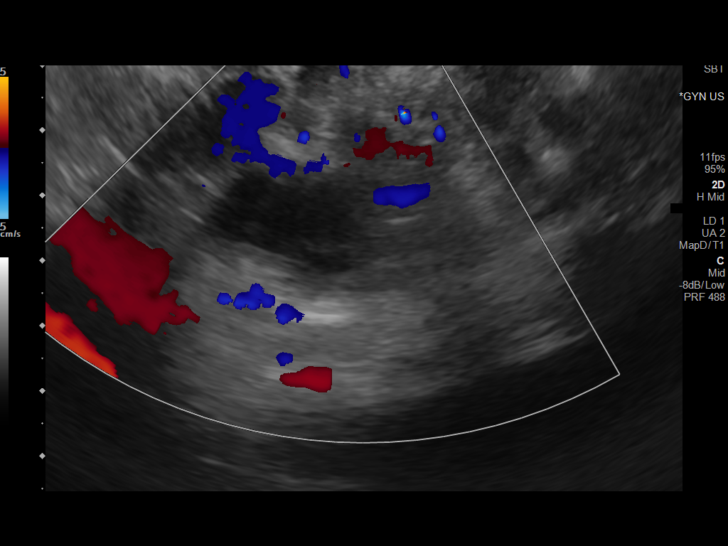
[im 17/22]
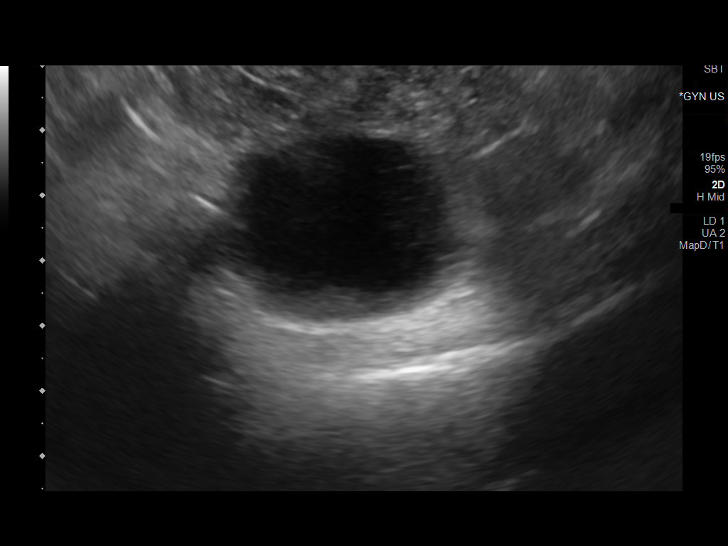
[im 19/22]
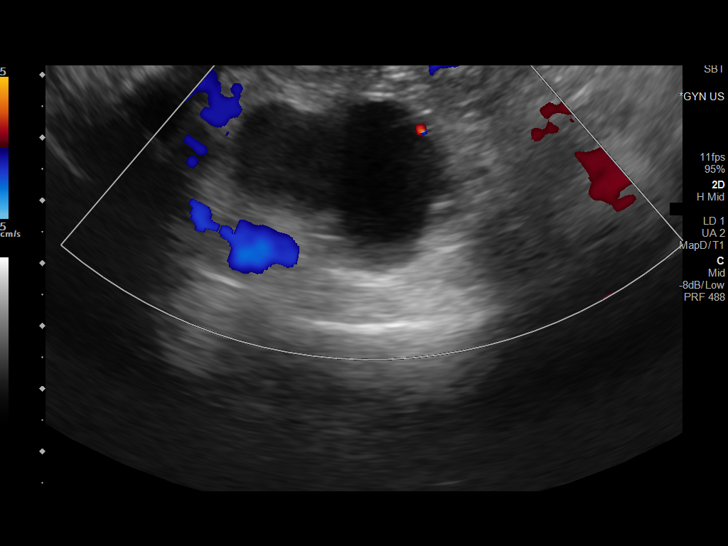
[im 20/22]
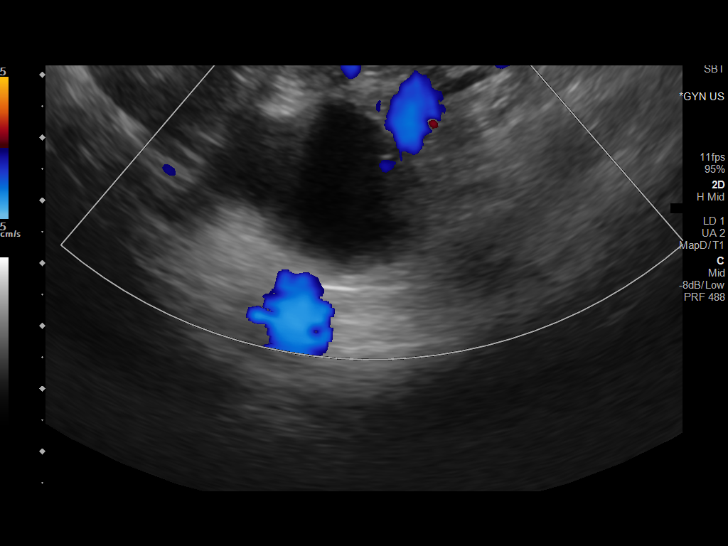
[im 22/22]
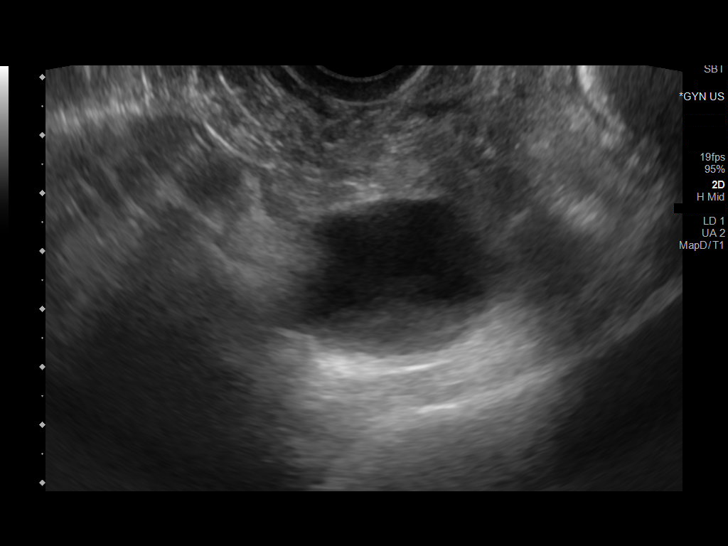

[14 of 22 positions shown; findings below may reference images not displayed]

FINDINGS: Uterus

Surgically absent

Endometrium

N/A

Right ovary

Measurements: 3.5 x 2.9 x 2.6 cm = volume: 14 mL. Simple cyst 2.3 x
2.6 x 2.0 cm. Additional smaller follicle cyst. No additional mass.

Left ovary

Not visualized on either transabdominal or endovaginal imaging,
likely obscured by bowel

Other findings:  No free pelvic fluid.  No other pelvic masses.
IMPRESSION: Dominant follicle RIGHT ovary 2.6 cm diameter.

Nonvisualization of LEFT ovary on the current study, unable to
reassess LEFT ovary for resolution of previously identified cyst.

No definite pelvic masses identified.

## 2019-09-22 ENCOUNTER — Other Ambulatory Visit: Payer: Self-pay

## 2019-09-22 ENCOUNTER — Ambulatory Visit (INDEPENDENT_AMBULATORY_CARE_PROVIDER_SITE_OTHER): Payer: BC Managed Care – PPO | Admitting: Family Medicine

## 2019-09-22 ENCOUNTER — Encounter (INDEPENDENT_AMBULATORY_CARE_PROVIDER_SITE_OTHER): Payer: Self-pay | Admitting: Family Medicine

## 2019-09-22 ENCOUNTER — Encounter: Payer: Self-pay | Admitting: Family Medicine

## 2019-09-22 VITALS — BP 125/82 | HR 77 | Temp 98.2°F | Ht 63.0 in | Wt 266.0 lb

## 2019-09-22 DIAGNOSIS — Z9189 Other specified personal risk factors, not elsewhere classified: Secondary | ICD-10-CM

## 2019-09-22 DIAGNOSIS — R739 Hyperglycemia, unspecified: Secondary | ICD-10-CM

## 2019-09-22 DIAGNOSIS — F3289 Other specified depressive episodes: Secondary | ICD-10-CM

## 2019-09-22 DIAGNOSIS — R0602 Shortness of breath: Secondary | ICD-10-CM

## 2019-09-22 DIAGNOSIS — Z6841 Body Mass Index (BMI) 40.0 and over, adult: Secondary | ICD-10-CM

## 2019-09-22 DIAGNOSIS — Z0289 Encounter for other administrative examinations: Secondary | ICD-10-CM

## 2019-09-22 DIAGNOSIS — D500 Iron deficiency anemia secondary to blood loss (chronic): Secondary | ICD-10-CM

## 2019-09-22 DIAGNOSIS — R5383 Other fatigue: Secondary | ICD-10-CM | POA: Diagnosis not present

## 2019-09-22 DIAGNOSIS — D508 Other iron deficiency anemias: Secondary | ICD-10-CM

## 2019-09-22 DIAGNOSIS — I10 Essential (primary) hypertension: Secondary | ICD-10-CM
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
## 2019-09-24 LAB — COMPREHENSIVE METABOLIC PANEL
ALT: 26 IU/L (ref 0–32)
AST: 19 IU/L (ref 0–40)
Albumin/Globulin Ratio: 1.5 (ref 1.2–2.2)
Albumin: 4.4 g/dL (ref 3.8–4.8)
Alkaline Phosphatase: 75 IU/L (ref 39–117)
BUN/Creatinine Ratio: 14 (ref 9–23)
BUN: 15 mg/dL (ref 6–24)
Bilirubin Total: <0.2 mg/dL (ref 0.0–1.2)
CO2: 25 mmol/L (ref 20–29)
Calcium: 9.7 mg/dL (ref 8.7–10.2)
Chloride: 105 mmol/L (ref 96–106)
Creatinine, Ser: 1.1 mg/dL — ABNORMAL HIGH (ref 0.57–1.00)
GFR calc Af Amer: 69 mL/min/{1.73_m2} (ref >59)
GFR calc non Af Amer: 60 mL/min/{1.73_m2} (ref >59)
Globulin, Total: 3 g/dL (ref 1.5–4.5)
Glucose: 102 mg/dL — ABNORMAL HIGH (ref 65–99)
Potassium: 4.8 mmol/L (ref 3.5–5.2)
Sodium: 144 mmol/L (ref 134–144)
Total Protein: 7.4 g/dL (ref 6.0–8.5)

## 2019-09-24 LAB — CBC WITH DIFFERENTIAL/PLATELET
Basophils Absolute: 0 10*3/uL (ref 0.0–0.2)
Basos: 0 %
EOS (ABSOLUTE): 0.2 10*3/uL (ref 0.0–0.4)
Eos: 3 %
Hemoglobin: 12.4 g/dL (ref 11.1–15.9)
Immature Grans (Abs): 0 10*3/uL (ref 0.0–0.1)
Immature Granulocytes: 0 %
Lymphocytes Absolute: 2.2 10*3/uL (ref 0.7–3.1)
Lymphs: 40 %
MCH: 28.1 pg (ref 26.6–33.0)
MCHC: 33.1 g/dL (ref 31.5–35.7)
MCV: 85 fL (ref 79–97)
Monocytes Absolute: 0.4 10*3/uL (ref 0.1–0.9)
Monocytes: 7 %
Neutrophils Absolute: 2.8 10*3/uL (ref 1.4–7.0)
Neutrophils: 50 %
Platelets: 240 10*3/uL (ref 150–450)
RBC: 4.42 x10E6/uL (ref 3.77–5.28)
RDW: 13.4 % (ref 11.7–15.4)
WBC: 5.6 10*3/uL (ref 3.4–10.8)

## 2019-09-24 LAB — INSULIN, RANDOM: INSULIN: 32.8 u[IU]/mL — ABNORMAL HIGH (ref 2.6–24.9)

## 2019-09-24 LAB — HEMOGLOBIN A1C
Est. average glucose Bld gHb Est-mCnc: 103 mg/dL
Hgb A1c MFr Bld: 5.2 % (ref 4.8–5.6)

## 2019-09-24 LAB — LIPID PANEL WITH LDL/HDL RATIO
Cholesterol, Total: 178 mg/dL (ref 100–199)
HDL: 42 mg/dL (ref >39)
LDL Chol Calc (NIH): 117 mg/dL — ABNORMAL HIGH (ref 0–99)
LDL/HDL Ratio: 2.8 ratio (ref 0.0–3.2)
Triglycerides: 103 mg/dL (ref 0–149)
VLDL Cholesterol Cal: 19 mg/dL (ref 5–40)

## 2019-09-24 LAB — ANEMIA PANEL
Ferritin: 226 ng/mL — ABNORMAL HIGH (ref 15–150)
Folate, Hemolysate: 429 ng/mL
Folate, RBC: 1144 ng/mL (ref >498)
Hematocrit: 37.5 % (ref 34.0–46.6)
Iron Saturation: 18 % (ref 15–55)
Iron: 50 ug/dL (ref 27–159)
Retic Ct Pct: 1.6 % (ref 0.6–2.6)
Total Iron Binding Capacity: 273 ug/dL (ref 250–450)
UIBC: 223 ug/dL (ref 131–425)
Vitamin B-12: >2000 pg/mL — ABNORMAL HIGH (ref 232–1245)

## 2019-09-24 LAB — VITAMIN D 25 HYDROXY (VIT D DEFICIENCY, FRACTURES): Vit D, 25-Hydroxy: 42.4 ng/mL (ref 30.0–100.0)

## 2019-09-24 LAB — T4, FREE: Free T4: 1.15 ng/dL (ref 0.82–1.77)

## 2019-09-24 LAB — TSH: TSH: 0.94 u[IU]/mL (ref 0.450–4.500)

## 2019-09-24 LAB — T3: T3, Total: 104 ng/dL (ref 71–180)

## 2019-09-27 DIAGNOSIS — S134XXA Sprain of ligaments of cervical spine, initial encounter: Secondary | ICD-10-CM | POA: Diagnosis not present

## 2019-10-01 ENCOUNTER — Ambulatory Visit (INDEPENDENT_AMBULATORY_CARE_PROVIDER_SITE_OTHER): Payer: BC Managed Care – PPO | Admitting: Psychology

## 2019-10-01 ENCOUNTER — Other Ambulatory Visit: Payer: Self-pay

## 2019-10-01 ENCOUNTER — Encounter (INDEPENDENT_AMBULATORY_CARE_PROVIDER_SITE_OTHER): Payer: Self-pay | Admitting: Family Medicine

## 2019-10-01 DIAGNOSIS — F3289 Other specified depressive episodes: Secondary | ICD-10-CM | POA: Diagnosis not present

## 2019-10-01 NOTE — Telephone Encounter (Signed)
Please advise 

## 2019-10-02 ENCOUNTER — Encounter (INDEPENDENT_AMBULATORY_CARE_PROVIDER_SITE_OTHER): Payer: Self-pay | Admitting: Family Medicine

## 2019-10-02 NOTE — Telephone Encounter (Signed)
Please review and advise.

## 2019-10-06 ENCOUNTER — Encounter (INDEPENDENT_AMBULATORY_CARE_PROVIDER_SITE_OTHER): Payer: Self-pay | Admitting: Family Medicine

## 2019-10-06 ENCOUNTER — Ambulatory Visit (INDEPENDENT_AMBULATORY_CARE_PROVIDER_SITE_OTHER): Payer: BC Managed Care – PPO | Admitting: Family Medicine

## 2019-10-06 ENCOUNTER — Other Ambulatory Visit (INDEPENDENT_AMBULATORY_CARE_PROVIDER_SITE_OTHER): Payer: Self-pay | Admitting: Family Medicine

## 2019-10-06 ENCOUNTER — Other Ambulatory Visit: Payer: Self-pay

## 2019-10-06 VITALS — BP 129/87 | HR 74 | Temp 98.7°F | Ht 63.0 in | Wt 263.0 lb

## 2019-10-06 DIAGNOSIS — E8881 Metabolic syndrome: Secondary | ICD-10-CM

## 2019-10-06 DIAGNOSIS — Z9189 Other specified personal risk factors, not elsewhere classified: Secondary | ICD-10-CM

## 2019-10-06 DIAGNOSIS — Z6841 Body Mass Index (BMI) 40.0 and over, adult: Secondary | ICD-10-CM

## 2019-10-06 DIAGNOSIS — F3289 Other specified depressive episodes: Secondary | ICD-10-CM

## 2019-10-06 DIAGNOSIS — E559 Vitamin D deficiency, unspecified: Secondary | ICD-10-CM | POA: Diagnosis not present

## 2019-10-06 MED ORDER — VITAMIN D (ERGOCALCIFEROL) 1.25 MG (50000 UNIT) PO CAPS: 50000.0000 [IU] | ORAL_CAPSULE | ORAL | 0 refills | Status: DC

## 2019-10-06 MED ORDER — BUPROPION HCL ER (SR) 150 MG PO TB12: 150.0000 mg | ORAL_TABLET | Freq: Every day | ORAL | 0 refills | Status: DC

## 2019-10-07 ENCOUNTER — Encounter (INDEPENDENT_AMBULATORY_CARE_PROVIDER_SITE_OTHER): Payer: Self-pay | Admitting: Family Medicine
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
## 2019-10-08 DIAGNOSIS — S134XXA Sprain of ligaments of cervical spine, initial encounter: Secondary | ICD-10-CM | POA: Diagnosis not present

## 2019-10-13 NOTE — Progress Notes (Signed)
Office: [phone]  /  Fax: [phone]    Date: October 22, 2019   Appointment Start Time: 2:35pm Duration: 25 minutes Provider: Glennie Isle, Psy.D. Type of Session: Individual Therapy  Location of Patient: Home Location of Provider: [facility] Type of Contact: Telepsychological Visit via [employer] WebEx  Session Content: This provider called Jordan Bush at 2:32pm as she did not present for the WebEx appointment. She indicated she did not see the e-mail; therefore, the e-mail with the secure link was re-sent. As such, today's appointment was initiated 5 minutes late. Jordan Bush is a 48 y.o. female presenting via [employer] for a follow-up appointment to address the previously established treatment goal of decreasing emotional eating. Today's appointment was a telepsychological visit due to COVID-19. Jordan Bush provided verbal consent for today's telepsychological appointment and she is aware she is responsible for securing confidentiality on her end of the session. Prior to proceeding with today's appointment, Jordan Bush's physical location at the time of this appointment was obtained as well a phone number she could be reached at in the event of technical difficulties. Jordan Bush and this provider participated in today's telepsychological service.   This provider conducted a brief check-in. Jordan Bush shared about recent events, including family members testing positive for COVID-19. She further shared her son moved out. Regarding eating, Jordan Bush reported making better choices and engaging in portion control. She also reported she removed foods from her home that result in cravings. Jordan Bush further noticed experiencing urges to snack secondary to feeling anxious or lonely, and acknowledged skipping meals due to anxiety. Thus, the importance of not skipping meals was provided. In addition, psychoeducation regarding triggers for emotional eating was provided. Jordan Bush was provided a handout, and  encouraged to utilize the handout between now and the next appointment to increase awareness of triggers and frequency. Jordan Bush agreed. This provider also discussed behavioral strategies for specific triggers, such as placing the utensil down when conversing to avoid mindless eating. Jordan Bush provided verbal consent during today's appointment for this provider to send a handout about triggers via e-mail. Jordan Bush was receptive to today's appointment as evidenced by openness to sharing, responsiveness to feedback, and willingness to explore triggers for emotional eating.  Mental Status Examination:  Appearance: neat Behavior: appropriate to circumstances Mood: euthymic Affect: mood congruent Speech: normal in rate, volume, and tone Eye Contact: appropriate Psychomotor Activity: appropriate Gait: unable to assess Thought Process: linear, logical, and goal directed  Thought Content/Perception: denies suicidal and homicidal ideation, plan, and intent and no hallucinations, delusions, bizarre thinking or behavior reported or observed Orientation: time, person, place and purpose of appointment Memory/Concentration: memory, attention, language, and fund of knowledge intact  Insight/Judgment: good  Interventions:  Conducted a brief chart review Provided empathic reflections and validation Reviewed content from the previous session Employed supportive psychotherapy interventions to facilitate reduced distress and to improve coping skills with identified stressors Employed motivational interviewing skills to assess patient's willingness/desire to adhere to recommended medical treatments and assignments Psychoeducation provided regarding triggers for emotional eating Psychoeducation provided regarding not skipping meals  DSM-5 Diagnosis: 311 (F32.8) Other Specified Depressive Disorder, Emotional Eating Behaviors  Treatment Goal & Progress: During the initial appointment with this provider, the  following treatment goal was established: decrease emotional eating. Jordan Bush has demonstrated some progress in her goal as evidenced by increased awareness of hunger patterns.   Plan: Due to the upcoming holidays and this provider being out of the office in the coming weeks, the next appointment will be scheduled in  approximately one month, which will be via [employer]. The next session will focus on introduction of mindfulness.

## 2019-10-14 DIAGNOSIS — S134XXA Sprain of ligaments of cervical spine, initial encounter: Secondary | ICD-10-CM | POA: Diagnosis not present

## 2019-10-20 ENCOUNTER — Encounter (INDEPENDENT_AMBULATORY_CARE_PROVIDER_SITE_OTHER): Payer: Self-pay | Admitting: Family Medicine

## 2019-10-20 ENCOUNTER — Other Ambulatory Visit: Payer: Self-pay

## 2019-10-20 ENCOUNTER — Ambulatory Visit (INDEPENDENT_AMBULATORY_CARE_PROVIDER_SITE_OTHER): Payer: BC Managed Care – PPO | Admitting: Family Medicine

## 2019-10-20 VITALS — BP 131/89 | HR 65 | Temp 98.7°F | Ht 63.0 in | Wt 263.0 lb

## 2019-10-20 DIAGNOSIS — Z9189 Other specified personal risk factors, not elsewhere classified: Secondary | ICD-10-CM | POA: Diagnosis not present

## 2019-10-20 DIAGNOSIS — E559 Vitamin D deficiency, unspecified: Secondary | ICD-10-CM | POA: Diagnosis not present

## 2019-10-20 DIAGNOSIS — Z6841 Body Mass Index (BMI) 40.0 and over, adult: Secondary | ICD-10-CM

## 2019-10-20 DIAGNOSIS — E8881 Metabolic syndrome: Secondary | ICD-10-CM

## 2019-10-20 DIAGNOSIS — F3289 Other specified depressive episodes: Secondary | ICD-10-CM

## 2019-10-20 MED ORDER — VITAMIN D (ERGOCALCIFEROL) 1.25 MG (50000 UNIT) PO CAPS: 50000.0000 [IU] | ORAL_CAPSULE | ORAL | 0 refills | Status: DC
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
## 2019-10-22 ENCOUNTER — Other Ambulatory Visit: Payer: Self-pay

## 2019-10-22 ENCOUNTER — Encounter (INDEPENDENT_AMBULATORY_CARE_PROVIDER_SITE_OTHER): Payer: Self-pay | Admitting: Family Medicine

## 2019-10-22 ENCOUNTER — Ambulatory Visit (INDEPENDENT_AMBULATORY_CARE_PROVIDER_SITE_OTHER): Payer: BC Managed Care – PPO | Admitting: Psychology

## 2019-10-22 DIAGNOSIS — F3289 Other specified depressive episodes: Secondary | ICD-10-CM | POA: Diagnosis not present

## 2019-10-23 DIAGNOSIS — S134XXA Sprain of ligaments of cervical spine, initial encounter: Secondary | ICD-10-CM | POA: Diagnosis not present

## 2019-10-26 ENCOUNTER — Other Ambulatory Visit (INDEPENDENT_AMBULATORY_CARE_PROVIDER_SITE_OTHER): Payer: Self-pay | Admitting: Family Medicine

## 2019-10-26 DIAGNOSIS — E559 Vitamin D deficiency, unspecified: Secondary | ICD-10-CM

## 2019-10-30 ENCOUNTER — Other Ambulatory Visit (HOSPITAL_BASED_OUTPATIENT_CLINIC_OR_DEPARTMENT_OTHER): Payer: Self-pay | Admitting: Family Medicine

## 2019-10-30 ENCOUNTER — Ambulatory Visit (INDEPENDENT_AMBULATORY_CARE_PROVIDER_SITE_OTHER): Payer: BC Managed Care – PPO

## 2019-10-30 ENCOUNTER — Other Ambulatory Visit: Payer: Self-pay

## 2019-10-30 DIAGNOSIS — B373 Candidiasis of vulva and vagina: Secondary | ICD-10-CM

## 2019-10-30 DIAGNOSIS — N898 Other specified noninflammatory disorders of vagina: Secondary | ICD-10-CM | POA: Diagnosis not present

## 2019-10-30 DIAGNOSIS — S134XXA Sprain of ligaments of cervical spine, initial encounter: Secondary | ICD-10-CM | POA: Diagnosis not present

## 2019-10-30 DIAGNOSIS — Z113 Encounter for screening for infections with a predominantly sexual mode of transmission: Secondary | ICD-10-CM

## 2019-10-30 DIAGNOSIS — Z1231 Encounter for screening mammogram for malignant neoplasm of breast: Secondary | ICD-10-CM

## 2019-10-30 MED ORDER — FLUCONAZOLE 100 MG PO TABS: 100.0000 mg | ORAL_TABLET | Freq: Every day | ORAL | 0 refills | Status: DC

## 2019-10-30 NOTE — Progress Notes (Addendum)
Patient states that she has had increasing vaginal itching in the last two days. Patient states that she thinks it is a yeast infection. Patient denies any abnormal discharge. Patient states that she would like STD testing done one the swab but denies blood testing at this time. Kathrene Alu RN  Attestation of Attending Supervision of RN: Evaluation and management procedures were performed by the nurse under my supervision and collaboration.  I have reviewed the nursing note and chart, and I agree with the management and plan.  Carolyn L. Harraway-Smith, M.D., Cherlynn June

## 2019-10-31 ENCOUNTER — Other Ambulatory Visit (INDEPENDENT_AMBULATORY_CARE_PROVIDER_SITE_OTHER): Payer: Self-pay | Admitting: Family Medicine

## 2019-10-31 DIAGNOSIS — F3289 Other specified depressive episodes: Secondary | ICD-10-CM

## 2019-10-31 LAB — CERVICOVAGINAL ANCILLARY ONLY
Bacterial Vaginitis (gardnerella): NEGATIVE
Candida Glabrata: POSITIVE — AB
Candida Vaginitis: POSITIVE — AB
Chlamydia: NEGATIVE
Comment: NEGATIVE
Comment: NEGATIVE
Comment: NEGATIVE
Comment: NEGATIVE
Comment: NEGATIVE
Comment: NORMAL
Neisseria Gonorrhea: NEGATIVE
Trichomonas: NEGATIVE

## 2019-11-03 ENCOUNTER — Encounter (INDEPENDENT_AMBULATORY_CARE_PROVIDER_SITE_OTHER): Payer: Self-pay | Admitting: Family Medicine

## 2019-11-03 ENCOUNTER — Other Ambulatory Visit: Payer: Self-pay

## 2019-11-03 ENCOUNTER — Ambulatory Visit (INDEPENDENT_AMBULATORY_CARE_PROVIDER_SITE_OTHER): Payer: BC Managed Care – PPO | Admitting: Family Medicine

## 2019-11-03 VITALS — BP 121/83 | HR 80 | Temp 98.8°F | Ht 63.0 in | Wt 259.0 lb

## 2019-11-03 DIAGNOSIS — F3289 Other specified depressive episodes: Secondary | ICD-10-CM | POA: Diagnosis not present

## 2019-11-03 DIAGNOSIS — H35033 Hypertensive retinopathy, bilateral: Secondary | ICD-10-CM | POA: Diagnosis not present

## 2019-11-03 DIAGNOSIS — Z9189 Other specified personal risk factors, not elsewhere classified: Secondary | ICD-10-CM

## 2019-11-03 DIAGNOSIS — E8881 Metabolic syndrome: Secondary | ICD-10-CM

## 2019-11-03 DIAGNOSIS — E559 Vitamin D deficiency, unspecified: Secondary | ICD-10-CM | POA: Diagnosis not present

## 2019-11-03 DIAGNOSIS — Z6841 Body Mass Index (BMI) 40.0 and over, adult: Secondary | ICD-10-CM

## 2019-11-04 MED ORDER — VITAMIN D (ERGOCALCIFEROL) 1.25 MG (50000 UNIT) PO CAPS: 50000.0000 [IU] | ORAL_CAPSULE | ORAL | 0 refills | Status: DC

## 2019-11-04 MED ORDER — BUPROPION HCL ER (SR) 150 MG PO TB12: 150.0000 mg | ORAL_TABLET | Freq: Every day | ORAL | 0 refills | Status: DC

## 2019-11-04 MED ORDER — PHENTERMINE HCL 8 MG PO TABS: 8.0000 mg | ORAL_TABLET | Freq: Every day | ORAL | 0 refills | Status: DC

## 2019-11-04 NOTE — Telephone Encounter (Signed)
Please advise about the lunch alternatives

## 2019-11-05 ENCOUNTER — Other Ambulatory Visit: Payer: Self-pay | Admitting: Obstetrics & Gynecology

## 2019-11-06 DIAGNOSIS — S134XXA Sprain of ligaments of cervical spine, initial encounter: Secondary | ICD-10-CM | POA: Diagnosis not present

## 2019-11-10 ENCOUNTER — Encounter (INDEPENDENT_AMBULATORY_CARE_PROVIDER_SITE_OTHER): Payer: Self-pay | Admitting: Family Medicine
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
## 2019-11-11 DIAGNOSIS — S134XXA Sprain of ligaments of cervical spine, initial encounter: Secondary | ICD-10-CM | POA: Diagnosis not present

## 2019-11-18 DIAGNOSIS — S134XXA Sprain of ligaments of cervical spine, initial encounter: Secondary | ICD-10-CM | POA: Diagnosis not present

## 2019-11-18 NOTE — Progress Notes (Unsigned)
Office: [phone]  /  Fax: [phone]    Date: November 24, 2019   Appointment Start Time: *** Duration: *** minutes Provider: Glennie Isle, Psy.D. Type of Session: Individual Therapy  Location of Patient: {gbptloc:23249} Location of Provider: {Location of Service:22491} Type of Contact: Telepsychological Visit via {gbtelepsych:23399}  Session Content: Jordan Bush is a 49 y.o. female presenting via {gbtelepsych:23399} for a follow-up appointment to address the previously established treatment goal of decreasing emotional eating. Today's appointment was a telepsychological visit due to COVID-19. Glendola provided verbal consent for today's telepsychological appointment and she is aware she is responsible for securing confidentiality on her end of the session. Prior to proceeding with today's appointment, Jordan Bush's physical location at the time of this appointment was obtained as well a phone number she could be reached at in the event of technical difficulties. [place] and this provider participated in today's telepsychological service.   This provider conducted a brief check-in and verbally administered the PHQ-9 and GAD-7. *** Jordan Bush was receptive to today's appointment as evidenced by openness to sharing, responsiveness to feedback, and {gbreceptiveness:23401}.  Mental Status Examination:  Appearance: {Appearance:22431} Behavior: {Behavior:22445} Mood: {gbmood:21757} Affect: {Affect:22436} Speech: {Speech:22432} Eye Contact: {Eye Contact:22433} Psychomotor Activity: {Motor Activity:22434} Gait: {gbgait:23404} Thought Process: {thought process:22448}  Thought Content/Perception: {disturbances:22451} Orientation: {Orientation:22437} Memory/Concentration: {gbcognition:22449} Insight/Judgment: {Insight:22446}  Structured Assessments Results: The Patient Health Questionnaire-9 (PHQ-9) is a self-report measure that assesses symptoms and severity of depression over the course of the  last two weeks. Jordan Bush obtained a score of *** suggesting {GBPHQ9SEVERITY:21752}. Jordan Bush finds the endorsed symptoms to be {gbphq9difficulty:21754}. [0= Not at all; 1= Several days; 2= More than half the days; 3= Nearly every day] Little interest or pleasure in doing things ***  Feeling down, depressed, or hopeless ***  Trouble falling or staying asleep, or sleeping too much ***  Feeling tired or having little energy ***  Poor appetite or overeating ***  Feeling bad about yourself --- or that you are a failure or have let yourself or your family down ***  Trouble concentrating on things, such as reading the newspaper or watching television ***  Moving or speaking so slowly that other people could have noticed? Or the opposite --- being so fidgety or restless that you have been moving around a lot more than usual ***  Thoughts that you would be better off dead or hurting yourself in some way ***  PHQ-9 Score ***    The Generalized Anxiety Disorder-7 (GAD-7) is a brief self-report measure that assesses symptoms of anxiety over the course of the last two weeks. Jordan Bush obtained a score of *** suggesting {gbgad7severity:21753}. Jordan Bush finds the endorsed symptoms to be {gbphq9difficulty:21754}. [0= Not at all; 1= Several days; 2= Over half the days; 3= Nearly every day] Feeling nervous, anxious, on edge ***  Not being able to stop or control worrying ***  Worrying too much about different things ***  Trouble relaxing ***  Being so restless that it's hard to sit still ***  Becoming easily annoyed or irritable ***  Feeling afraid as if something awful might happen ***  GAD-7 Score ***   Interventions:  {Interventions for Progress Notes:23405}  DSM-5 Diagnosis: 311 (F32.8) Other Specified Depressive Disorder, Emotional Eating Behaviors  Treatment Goal & Progress: During the initial appointment with this provider, the following treatment goal was established: decrease emotional eating. Jordan Bush  has demonstrated progress in her goal as evidenced by {gbtxprogress:22839}. Jordan Bush also {gbtxprogress2:22951}.  Plan: The next appointment will be scheduled in {gbweeks:21758}, which will  be {gbtxmodality:23402}. The next session will focus on {Plan for Next Appointment:23400}.

## 2019-11-24 ENCOUNTER — Ambulatory Visit (INDEPENDENT_AMBULATORY_CARE_PROVIDER_SITE_OTHER): Payer: BC Managed Care – PPO | Admitting: Psychology

## 2019-11-25 ENCOUNTER — Encounter (INDEPENDENT_AMBULATORY_CARE_PROVIDER_SITE_OTHER): Payer: Self-pay | Admitting: Family Medicine

## 2019-11-25 ENCOUNTER — Other Ambulatory Visit: Payer: Self-pay

## 2019-11-25 ENCOUNTER — Ambulatory Visit (INDEPENDENT_AMBULATORY_CARE_PROVIDER_SITE_OTHER): Payer: BC Managed Care – PPO | Admitting: Family Medicine

## 2019-11-25 VITALS — BP 133/85 | HR 77 | Temp 98.4°F | Ht 63.0 in | Wt 254.0 lb

## 2019-11-25 DIAGNOSIS — I10 Essential (primary) hypertension: Secondary | ICD-10-CM | POA: Diagnosis not present

## 2019-11-25 DIAGNOSIS — E559 Vitamin D deficiency, unspecified: Secondary | ICD-10-CM

## 2019-11-25 DIAGNOSIS — E66813 Obesity, class 3: Secondary | ICD-10-CM

## 2019-11-25 DIAGNOSIS — R7989 Other specified abnormal findings of blood chemistry: Secondary | ICD-10-CM

## 2019-11-25 DIAGNOSIS — F3289 Other specified depressive episodes: Secondary | ICD-10-CM | POA: Diagnosis not present

## 2019-11-25 DIAGNOSIS — Z6841 Body Mass Index (BMI) 40.0 and over, adult: Secondary | ICD-10-CM

## 2019-11-25 MED ORDER — VITAMIN D (ERGOCALCIFEROL) 1.25 MG (50000 UNIT) PO CAPS: 50000.0000 [IU] | ORAL_CAPSULE | ORAL | 0 refills | Status: DC

## 2019-11-25 MED ORDER — BUPROPION HCL ER (SR) 150 MG PO TB12: 150.0000 mg | ORAL_TABLET | Freq: Every day | ORAL | 0 refills | Status: DC

## 2019-11-25 MED ORDER — PHENTERMINE HCL 8 MG PO TABS: 8.0000 mg | ORAL_TABLET | Freq: Every day | ORAL | 0 refills | Status: DC

## 2019-11-26 NOTE — Progress Notes (Signed)
Chief Complaint:   OBESITY Jordan Bush is here to discuss her progress with her obesity treatment plan along with follow-up of her obesity related diagnoses. Jordan Bush is on the Category 3 Plan and states she is following her eating plan approximately 85% of the time. Jordan Bush states she is exercising to YouTube for 30 minutes 2 times per week and walking and jogging for 3 miles 3-5 times per week.  Today's visit was #: 5 Starting weight: 266 lbs Starting date: 09/22/2019 Today's weight: 254 lbs Today's date: 11/25/2019 Total lbs lost to date: 12 lbs Total lbs lost since last in-office visit: 5 lbs  Interim History: Jordan Bush states she is dating but is frustrated.  She has been exercising regularly.  Subjective:   1. Vitamin D deficiency Vitamin D level was 42.4 on 09/22/2019. She is currently taking vit D. She denies nausea, vomiting or muscle weakness.  2. Essential hypertension Jordan Bush is currently taking Norvasc and chlorthalidone for blood pressure control. Cardiovascular ROS: no chest pain or dyspnea on exertion.  BP Readings from Last 3 Encounters:  11/25/19 133/85  11/03/19 121/83  10/20/19 131/89   3. Other depression, emotional eating Jordan Bush is struggling with emotional eating and using food for comfort to the extent that it is negatively impacting her health. She has been working on behavior modification techniques to help reduce her emotional eating and has been somewhat successful. She shows no sign of suicidal or homicidal ideations.  She has seen Dr. Mallie Mussel, and is taking phentermine and Wellbutrin.  4. Elevated serum creatinine Jordan Bush's creatinine was elevated at last check.  Lab Results  Component Value Date   CREATININE 1.10 (H) 09/22/2019   Assessment/Plan:   1. Vitamin D deficiency Low Vitamin D level contributes to fatigue and are associated with obesity, breast, and colon cancer. She agrees to continue to take prescription Vitamin D @50 ,000 IU  every week and will follow-up for routine testing of vitamin D, at least 2-3 times per year to avoid over-replacement.  Orders - Vitamin D, Ergocalciferol, (DRISDOL) 1.25 MG (50000 UNIT) CAPS capsule; Take 1 capsule (50,000 Units total) by mouth every 7 (seven) days.  Dispense: 4 capsule; Refill: 0  2. Essential hypertension Jordan Bush is working on healthy weight loss and exercise to improve blood pressure control. We will watch for signs of hypotension as she continues her lifestyle modifications.  3. Other depression, emotional eating Behavior modification techniques were discussed today to help Jordan Bush deal with her emotional/non-hunger eating behaviors.  Orders and follow up as documented in patient record.   Orders - buPROPion (WELLBUTRIN SR) 150 MG 12 hr tablet; Take 1 tablet (150 mg total) by mouth daily.  Dispense: 30 tablet; Refill: 0 - Phentermine HCl 8 MG TABS; Take 8 mg by mouth daily.  Dispense: 30 tablet; Refill: 0  4. Elevated serum creatinine We will continue to monitor. Orders and follow up as documented in patient record. Recommended sodium restriction. Avoid nephrotoxic medications.   5. Class 3 severe obesity with serious comorbidity and body mass index (BMI) of 45.0 to 49.9 in adult, unspecified obesity type Jordan Bush) Jordan Bush is currently in the action stage of change. As such, her goal is to continue with weight loss efforts. She has agreed to on the Category 3 Plan.   We discussed the following exercise goals today: For substantial health benefits, adults should do at least 150 minutes (2 hours and 30 minutes) a week of moderate-intensity, or 75 minutes (1 hour and 15 minutes)  a week of vigorous-intensity aerobic physical activity, or an equivalent combination of moderate- and vigorous-intensity aerobic activity. Aerobic activity should be performed in episodes of at least 10 minutes, and preferably, it should be spread throughout the week. Adults should also include  muscle-strengthening activities that involve all major muscle groups on 2 or more days a week.  We discussed the following behavioral modification strategies today: increasing lean protein intake, decreasing simple carbohydrates and increasing vegetables.  Jordan Bush Jordan Bush has agreed to follow-up with our clinic in 2 weeks. She was informed of the importance of frequent follow-up visits to maximize her success with intensive lifestyle modifications for her multiple health conditions.   Objective:   Blood pressure 133/85, pulse 77, temperature 98.4 F (36.9 C), temperature source Oral, height 5\' 3"  (1.6 m), weight 254 lb (115.2 kg), SpO2 100 %. Body mass index is 44.99 kg/m.  General: Cooperative, alert, well developed, in no acute distress. HEENT: Conjunctivae and lids unremarkable. Neck: No thyromegaly.  Cardiovascular: Regular rhythm.  Lungs: Normal work of breathing. Extremities: No edema.  Neurologic: No focal deficits.   Lab Results  Component Value Date   CREATININE 1.10 (H) 09/22/2019   BUN 15 09/22/2019   NA 144 09/22/2019   K 4.8 09/22/2019   CL 105 09/22/2019   CO2 25 09/22/2019   Lab Results  Component Value Date   ALT 26 09/22/2019   AST 19 09/22/2019   ALKPHOS 75 09/22/2019   BILITOT <0.2 09/22/2019   Lab Results  Component Value Date   HGBA1C 5.2 09/22/2019   HGBA1C 5.6 06/27/2017   Lab Results  Component Value Date   INSULIN 32.8 (H) 09/22/2019   Lab Results  Component Value Date   TSH 0.940 09/22/2019   Lab Results  Component Value Date   CHOL 178 09/22/2019   HDL 42 09/22/2019   LDLCALC 117 (H) 09/22/2019   TRIG 103 09/22/2019   CHOLHDL 4 11/29/2018   Lab Results  Component Value Date   WBC 5.6 09/22/2019   HGB 12.4 09/22/2019   HCT 37.5 09/22/2019   MCV 85 09/22/2019   PLT 240 09/22/2019   Lab Results  Component Value Date   IRON 50 09/22/2019   TIBC 273 09/22/2019   FERRITIN 226 (H) 09/22/2019   Attestation Statements:    Reviewed by clinician on day of visit: allergies, medications, problem list, medical history, surgical history, family history, social history, and previous encounter notes.  I, Water quality scientist, CMA, am acting as Location manager for [employer], DO.  I have reviewed the above documentation for accuracy and completeness, and I agree with the above. Briscoe Deutscher, DO

## 2019-12-01 ENCOUNTER — Other Ambulatory Visit: Payer: Self-pay

## 2019-12-02 ENCOUNTER — Other Ambulatory Visit: Payer: Self-pay

## 2019-12-02 ENCOUNTER — Ambulatory Visit (INDEPENDENT_AMBULATORY_CARE_PROVIDER_SITE_OTHER): Payer: BC Managed Care – PPO | Admitting: Family Medicine

## 2019-12-02 ENCOUNTER — Other Ambulatory Visit: Payer: Self-pay | Admitting: Family Medicine

## 2019-12-02 ENCOUNTER — Encounter: Payer: Self-pay | Admitting: Family Medicine

## 2019-12-02 VITALS — BP 110/72 | HR 83 | Temp 96.5°F | Ht 62.0 in | Wt 256.0 lb

## 2019-12-02 DIAGNOSIS — N289 Disorder of kidney and ureter, unspecified: Secondary | ICD-10-CM

## 2019-12-02 DIAGNOSIS — Z0000 Encounter for general adult medical examination without abnormal findings: Secondary | ICD-10-CM | POA: Diagnosis not present

## 2019-12-02 LAB — CBC
HCT: 37 % (ref 36.0–46.0)
Hemoglobin: 12.1 g/dL (ref 12.0–15.0)
MCHC: 32.7 g/dL (ref 30.0–36.0)
MCV: 86 fl (ref 78.0–100.0)
Platelets: 242 10*3/uL (ref 150.0–400.0)
RBC: 4.3 Mil/uL (ref 3.87–5.11)
RDW: 14.6 % (ref 11.5–15.5)
WBC: 8.1 10*3/uL (ref 4.0–10.5)

## 2019-12-02 LAB — COMPREHENSIVE METABOLIC PANEL
ALT: 18 U/L (ref 0–35)
AST: 13 U/L (ref 0–37)
Albumin: 4.1 g/dL (ref 3.5–5.2)
Alkaline Phosphatase: 60 U/L (ref 39–117)
BUN: 17 mg/dL (ref 6–23)
CO2: 27 mEq/L (ref 19–32)
Calcium: 9.4 mg/dL (ref 8.4–10.5)
Chloride: 103 mEq/L (ref 96–112)
Creatinine, Ser: 1.22 mg/dL — ABNORMAL HIGH (ref 0.40–1.20)
GFR: 57.03 mL/min — ABNORMAL LOW (ref >60.00)
Glucose, Bld: 107 mg/dL — ABNORMAL HIGH (ref 70–99)
Potassium: 3.9 mEq/L (ref 3.5–5.1)
Sodium: 138 mEq/L (ref 135–145)
Total Bilirubin: 0.4 mg/dL (ref 0.2–1.2)
Total Protein: 7 g/dL (ref 6.0–8.3)

## 2019-12-02 LAB — LIPID PANEL
Cholesterol: 169 mg/dL (ref 0–200)
HDL: 40.3 mg/dL (ref >39.00)
LDL Cholesterol: 108 mg/dL — ABNORMAL HIGH (ref 0–99)
NonHDL: 128.78
Total CHOL/HDL Ratio: 4
Triglycerides: 106 mg/dL (ref 0.0–149.0)
VLDL: 21.2 mg/dL (ref 0.0–40.0)

## 2019-12-02 MED ORDER — FLUTICASONE PROPIONATE 50 MCG/ACT NA SUSP: 2.0000 | Freq: Every day | NASAL | 5 refills | Status: DC

## 2019-12-02 NOTE — Patient Instructions (Addendum)
Give [name] 2-3 business days to get the results of your labs back.   Keep the diet clean and stay active.  Wt goal for July, 2021: 235-240 lbs.  Mind your blood pressure at home. If it is consistently <110 on the top and/or <70 on the bottom, let me know and we will adjust your medication.   Let us know if you need anything.
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
## 2019-12-04 DIAGNOSIS — G4733 Obstructive sleep apnea (adult) (pediatric): Secondary | ICD-10-CM | POA: Diagnosis not present

## 2019-12-05 ENCOUNTER — Encounter (INDEPENDENT_AMBULATORY_CARE_PROVIDER_SITE_OTHER): Payer: Self-pay | Admitting: Family Medicine

## 2019-12-08 NOTE — Telephone Encounter (Signed)
Please advise 

## 2019-12-12 ENCOUNTER — Ambulatory Visit (HOSPITAL_BASED_OUTPATIENT_CLINIC_OR_DEPARTMENT_OTHER)
Admission: RE | Admit: 2019-12-12 | Discharge: 2019-12-12 | Disposition: A | Discharge status: Home / Self Care | Payer: BC Managed Care – PPO | Source: Ambulatory Visit | Attending: Family Medicine | Admitting: Family Medicine

## 2019-12-12 ENCOUNTER — Other Ambulatory Visit: Payer: Self-pay

## 2019-12-12 DIAGNOSIS — Z1231 Encounter for screening mammogram for malignant neoplasm of breast: Secondary | ICD-10-CM | POA: Insufficient documentation

## 2019-12-15 ENCOUNTER — Encounter (INDEPENDENT_AMBULATORY_CARE_PROVIDER_SITE_OTHER): Payer: Self-pay | Admitting: Family Medicine

## 2019-12-15 ENCOUNTER — Ambulatory Visit (INDEPENDENT_AMBULATORY_CARE_PROVIDER_SITE_OTHER): Payer: BC Managed Care – PPO | Admitting: Family Medicine

## 2019-12-15 ENCOUNTER — Other Ambulatory Visit: Payer: Self-pay

## 2019-12-15 VITALS — BP 135/85 | HR 87 | Temp 98.4°F | Ht 63.0 in | Wt 253.0 lb

## 2019-12-15 DIAGNOSIS — E559 Vitamin D deficiency, unspecified: Secondary | ICD-10-CM | POA: Diagnosis not present

## 2019-12-15 DIAGNOSIS — F3289 Other specified depressive episodes: Secondary | ICD-10-CM | POA: Diagnosis not present

## 2019-12-15 DIAGNOSIS — Z6841 Body Mass Index (BMI) 40.0 and over, adult: Secondary | ICD-10-CM

## 2019-12-15 DIAGNOSIS — R7982 Elevated C-reactive protein (CRP): Secondary | ICD-10-CM

## 2019-12-15 DIAGNOSIS — Z9189 Other specified personal risk factors, not elsewhere classified: Secondary | ICD-10-CM | POA: Diagnosis not present

## 2019-12-15 MED ORDER — VITAMIN D (ERGOCALCIFEROL) 1.25 MG (50000 UNIT) PO CAPS: 50000.0000 [IU] | ORAL_CAPSULE | ORAL | 0 refills | Status: DC

## 2019-12-15 MED ORDER — BUPROPION HCL ER (SR) 150 MG PO TB12: 150.0000 mg | ORAL_TABLET | Freq: Every day | ORAL | 0 refills | Status: DC

## 2019-12-16 ENCOUNTER — Other Ambulatory Visit (INDEPENDENT_AMBULATORY_CARE_PROVIDER_SITE_OTHER): Payer: BC Managed Care – PPO

## 2019-12-16 ENCOUNTER — Other Ambulatory Visit: Payer: Self-pay

## 2019-12-16 DIAGNOSIS — N289 Disorder of kidney and ureter, unspecified: Secondary | ICD-10-CM

## 2019-12-16 LAB — BASIC METABOLIC PANEL
BUN: 23 mg/dL (ref 6–23)
CO2: 27 mEq/L (ref 19–32)
Calcium: 8.8 mg/dL (ref 8.4–10.5)
Chloride: 103 mEq/L (ref 96–112)
Creatinine, Ser: 1.19 mg/dL (ref 0.40–1.20)
GFR: 58.68 mL/min — ABNORMAL LOW (ref >60.00)
Glucose, Bld: 115 mg/dL — ABNORMAL HIGH (ref 70–99)
Potassium: 3.9 mEq/L (ref 3.5–5.1)
Sodium: 137 mEq/L (ref 135–145)

## 2019-12-17 ENCOUNTER — Encounter: Payer: Self-pay | Admitting: Genetic Counselor

## 2019-12-23 NOTE — Progress Notes (Signed)
Chief Complaint:   OBESITY Jordan Bush is here to discuss her progress with her obesity treatment plan along with follow-up of her obesity related diagnoses. Jordan Bush is on the Category 3 Plan and states she is following her eating plan approximately 90% of the time. Jordan Bush states she is walking and doing hip hop dance for 30-60 minutes 3 times per week.  Today's visit was #: 6 Starting weight: 266 lbs Starting date: 09/22/2019 Today's weight: 253 lbs Today's date: 12/15/2019 Total lbs lost to date: 13 Total lbs lost since last in-office visit: 1  Interim History: [name] continues to go in the direction with weight loss. She has been out of her phentermine and she notes some increased cravings.  Subjective:   1. Vitamin D deficiency [name] is stable on Vit D, but her level is not yet at goal. She requests a refill today.   2. Other depression, emotional eating Jordan Bush is stable on Wellbutrin, but she is struggling with increased cravings and emotional eating.  3. Elevated C-reactive protein Jordan Bush's creatinine is elevated at 1.19, but this has improved slightly. Her blood pressure is controlled today. She is working on diet and weight loss. She denies shortness of breath at rest. I discussed labs with the patient today.  4. At risk for heart disease Jordan Bush is at a higher than average risk for cardiovascular disease due to obesity. Reviewed: no chest pain on exertion, no dyspnea on exertion, and no swelling of ankles.  Assessment/Plan:   1. Vitamin D deficiency Low Vitamin D level contributes to fatigue and are associated with obesity, breast, and colon cancer. We will refill prescription Vitamin D for 1 month. Jordan Bush will follow-up for routine testing of Vitamin D, at least 2-3 times per year to avoid over-replacement.  - Vitamin D, Ergocalciferol, (DRISDOL) 1.25 MG (50000 UNIT) CAPS capsule; Take 1 capsule (50,000 Units total) by mouth every 7 (seven) days.  Dispense: 4  capsule; Refill: 0  2. Other depression, emotional eating Behavior modification techniques were discussed today to help [name] deal with her emotional/non-hunger eating behaviors. We discussed ways to decrease cravings with medications. We will refill Wellbutrin for 1 month. Orders and follow up as documented in patient record.   - buPROPion (WELLBUTRIN SR) 150 MG 12 hr tablet; Take 1 tablet (150 mg total) by mouth daily.  Dispense: 30 tablet; Refill: 0  3. Elevated C-reactive protein Jordan Bush will continue with diet and exercise, and we will recheck labs in 3 months.  4. At risk for heart disease Jordan Bush was given approximately 15 minutes of coronary artery disease prevention counseling today. She is 49 y.o. female and has risk factors for heart disease including obesity. We discussed intensive lifestyle modifications today with an emphasis on specific weight loss instructions and strategies.   Repetitive spaced learning was employed today to elicit superior memory formation and behavioral change.  5. Class 3 severe obesity with serious comorbidity and body mass index (BMI) of 40.0 to 44.9 in adult, unspecified obesity type [name]) Jordan Bush is currently in the action stage of change. As such, her goal is to continue with weight loss efforts. She has agreed to the Category 3 Plan.   We discussed various medication options to help Jordan Bush with her weight loss efforts and we both agreed to continue to discontinue phentermine, and will continue to monitor.  Exercise goals: No exercise has been prescribed at this time.  Behavioral modification strategies: increasing lean protein intake and emotional eating strategies.  Jordan Bush has  agreed to follow-up with our clinic in 3 weeks. She was informed of the importance of frequent follow-up visits to maximize her success with intensive lifestyle modifications for her multiple health conditions.   Objective:   Blood pressure 135/85, pulse 87,  temperature 98.4 F (36.9 C), temperature source Oral, height 5\' 3"  (1.6 m), weight 253 lb (114.8 kg), SpO2 100 %. Body mass index is 44.82 kg/m.  General: Cooperative, alert, well developed, in no acute distress. HEENT: Conjunctivae and lids unremarkable. Cardiovascular: Regular rhythm.  Lungs: Normal work of breathing. Neurologic: No focal deficits.   Lab Results  Component Value Date   CREATININE 1.19 12/16/2019   BUN 23 12/16/2019   NA 137 12/16/2019   K 3.9 12/16/2019   CL 103 12/16/2019   CO2 27 12/16/2019   Lab Results  Component Value Date   ALT 18 12/02/2019   AST 13 12/02/2019   ALKPHOS 60 12/02/2019   BILITOT 0.4 12/02/2019   Lab Results  Component Value Date   HGBA1C 5.2 09/22/2019   HGBA1C 5.6 06/27/2017   Lab Results  Component Value Date   INSULIN 32.8 (H) 09/22/2019   Lab Results  Component Value Date   TSH 0.940 09/22/2019   Lab Results  Component Value Date   CHOL 169 12/02/2019   HDL 40.30 12/02/2019   LDLCALC 108 (H) 12/02/2019   TRIG 106.0 12/02/2019   CHOLHDL 4 12/02/2019   Lab Results  Component Value Date   WBC 8.1 12/02/2019   HGB 12.1 12/02/2019   HCT 37.0 12/02/2019   MCV 86.0 12/02/2019   PLT 242.0 12/02/2019   Lab Results  Component Value Date   IRON 50 09/22/2019   TIBC 273 09/22/2019   FERRITIN 226 (H) 09/22/2019   Attestation Statements:   Reviewed by clinician on day of visit: allergies, medications, problem list, medical history, surgical history, family history, social history, and previous encounter notes.   I, Trixie Dredge, am acting as transcriptionist for Dennard Nip, MD.  I have reviewed the above documentation for accuracy and completeness, and I agree with the above. -  Dennard Nip, MD

## 2019-12-31 ENCOUNTER — Other Ambulatory Visit: Payer: Self-pay

## 2019-12-31 ENCOUNTER — Encounter (INDEPENDENT_AMBULATORY_CARE_PROVIDER_SITE_OTHER): Payer: Self-pay | Admitting: Family Medicine

## 2019-12-31 ENCOUNTER — Ambulatory Visit (INDEPENDENT_AMBULATORY_CARE_PROVIDER_SITE_OTHER): Payer: BC Managed Care – PPO | Admitting: Family Medicine

## 2019-12-31 VITALS — BP 123/80 | HR 73 | Temp 98.4°F | Ht 63.0 in | Wt 251.0 lb

## 2019-12-31 DIAGNOSIS — Z6841 Body Mass Index (BMI) 40.0 and over, adult: Secondary | ICD-10-CM

## 2019-12-31 DIAGNOSIS — I10 Essential (primary) hypertension: Secondary | ICD-10-CM

## 2019-12-31 DIAGNOSIS — Z9189 Other specified personal risk factors, not elsewhere classified: Secondary | ICD-10-CM | POA: Diagnosis not present

## 2019-12-31 DIAGNOSIS — E559 Vitamin D deficiency, unspecified: Secondary | ICD-10-CM

## 2019-12-31 DIAGNOSIS — F3289 Other specified depressive episodes: Secondary | ICD-10-CM | POA: Diagnosis not present

## 2019-12-31 MED ORDER — VITAMIN D (ERGOCALCIFEROL) 1.25 MG (50000 UNIT) PO CAPS: 50000.0000 [IU] | ORAL_CAPSULE | ORAL | 0 refills | Status: DC

## 2019-12-31 MED ORDER — PHENTERMINE HCL 8 MG PO TABS: 8.0000 mg | ORAL_TABLET | Freq: Every day | ORAL | 0 refills | Status: DC

## 2019-12-31 NOTE — Progress Notes (Signed)
Chief Complaint:   OBESITY Jordan Bush is here to discuss her progress with her obesity treatment plan along with follow-up of her obesity related diagnoses. Jordan Bush is on the Category 3 Plan and states she is following her eating plan approximately 90% of the time. Jordan Bush states she is walking/running for 30 minutes 4 times per week.  Today's visit was #: 7 Starting weight: 266 lbs Starting date: 09/22/2019 Today's weight: 251 lbs Today's date: 12/31/2019 Total lbs lost to date: 15 lbs Total lbs lost since last in-office visit: 2 lbs  Interim History: Jordan Bush says she is having no issues with the diet plan.  She has been exercising regularly.  Subjective:   1. Vitamin D deficiency Jordan Bush Vitamin D level was 42.4 on 09/22/2019. She is currently taking vit D. She denies nausea, vomiting or muscle weakness.  2. Other depression, emotional eating Jordan Bush is struggling with emotional eating and using food for comfort to the extent that it is negatively impacting her health. She has been working on behavior modification techniques to help reduce her emotional eating and has been unsuccessful. She shows no sign of suicidal or homicidal ideations.  3. Essential hypertension Review: taking medications as instructed, no medication side effects noted, no chest pain on exertion, no dyspnea on exertion, no swelling of ankles.  Blood pressure is at goal.    BP Readings from Last 3 Encounters:  12/31/19 123/80  12/15/19 135/85  12/02/19 110/72   4. At risk for diabetes mellitus Jordan Bush is at higher than average risk for developing diabetes due to her obesity.   Assessment/Plan:   1. Vitamin D deficiency Low Vitamin D level contributes to fatigue and are associated with obesity, breast, and colon cancer. She agrees to continue to take prescription Vitamin D @50 ,000 IU every week and will follow-up for routine testing of Vitamin D, at least 2-3 times per year to avoid  over-replacement.  Orders - Vitamin D, Ergocalciferol, (DRISDOL) 1.25 MG (50000 UNIT) CAPS capsule; Take 1 capsule (50,000 Units total) by mouth every 7 (seven) days.  Dispense: 4 capsule; Refill: 0  2. Other depression, emotional eating Behavior modification techniques were discussed today to help Jordan Bush deal with her emotional/non-hunger eating behaviors.  Orders and follow up as documented in patient record.   Orders - Phentermine HCl 8 MG TABS; Take 8 mg by mouth daily.  Dispense: 30 tablet; Refill: 0  3. Essential hypertension Jordan Bush is working on healthy weight loss and exercise to improve blood pressure control. We will watch for signs of hypotension as she continues her lifestyle modifications.  4. At risk for diabetes mellitus Jordan Bush was given approximately 15 minutes of diabetes education and counseling today. We discussed intensive lifestyle modifications today with an emphasis on weight loss as well as increasing exercise and decreasing simple carbohydrates in her diet. We also reviewed medication options with an emphasis on risk versus benefit of those discussed.   Repetitive spaced learning was employed today to elicit superior memory formation and behavioral change.  5. Class 3 severe obesity with serious comorbidity and body mass index (BMI) of 40.0 to 44.9 in adult, unspecified obesity type [facility]) Jordan Bush is currently in the action stage of change. As such, her goal is to continue with weight loss efforts. She has agreed to the Category 3 Plan.   Exercise goals: As is.  Behavioral modification strategies: increasing lean protein intake and increasing water intake.  Jordan Bush has agreed to follow-up with our clinic in 2 weeks. She  was informed of the importance of frequent follow-up visits to maximize her success with intensive lifestyle modifications for her multiple health conditions.   Objective:   Blood pressure 123/80, pulse 73, temperature 98.4 F (36.9 C),  temperature source Oral, height 5\' 3"  (1.6 m), weight 251 lb (113.9 kg), SpO2 98 %. Body mass index is 44.46 kg/m.  General: Cooperative, alert, well developed, in no acute distress. HEENT: Conjunctivae and lids unremarkable. Cardiovascular: Regular rhythm.  Lungs: Normal work of breathing. Neurologic: No focal deficits.   Lab Results  Component Value Date   CREATININE 1.19 12/16/2019   BUN 23 12/16/2019   NA 137 12/16/2019   K 3.9 12/16/2019   CL 103 12/16/2019   CO2 27 12/16/2019   Lab Results  Component Value Date   ALT 18 12/02/2019   AST 13 12/02/2019   ALKPHOS 60 12/02/2019   BILITOT 0.4 12/02/2019   Lab Results  Component Value Date   HGBA1C 5.2 09/22/2019   HGBA1C 5.6 06/27/2017   Lab Results  Component Value Date   INSULIN 32.8 (H) 09/22/2019   Lab Results  Component Value Date   TSH 0.940 09/22/2019   Lab Results  Component Value Date   CHOL 169 12/02/2019   HDL 40.30 12/02/2019   LDLCALC 108 (H) 12/02/2019   TRIG 106.0 12/02/2019   CHOLHDL 4 12/02/2019   Lab Results  Component Value Date   WBC 8.1 12/02/2019   HGB 12.1 12/02/2019   HCT 37.0 12/02/2019   MCV 86.0 12/02/2019   PLT 242.0 12/02/2019   Lab Results  Component Value Date   IRON 50 09/22/2019   TIBC 273 09/22/2019   FERRITIN 226 (H) 09/22/2019   Attestation Statements:   Reviewed by clinician on day of visit: allergies, medications, problem list, medical history, surgical history, family history, social history, and previous encounter notes.  I, Water quality scientist, CMA, am acting as Location manager for [employer], DO.  I have reviewed the above documentation for accuracy and completeness, and I agree with the above. Briscoe Deutscher, DO

## 2020-01-09 DIAGNOSIS — Z03818 Encounter for observation for suspected exposure to other biological agents ruled out: Secondary | ICD-10-CM | POA: Diagnosis not present

## 2020-01-09 DIAGNOSIS — Z20828 Contact with and (suspected) exposure to other viral communicable diseases: Secondary | ICD-10-CM | POA: Diagnosis not present

## 2020-01-14 ENCOUNTER — Other Ambulatory Visit (INDEPENDENT_AMBULATORY_CARE_PROVIDER_SITE_OTHER): Payer: Self-pay | Admitting: Family Medicine

## 2020-01-14 DIAGNOSIS — F3289 Other specified depressive episodes: Secondary | ICD-10-CM

## 2020-01-21 ENCOUNTER — Encounter (INDEPENDENT_AMBULATORY_CARE_PROVIDER_SITE_OTHER): Payer: Self-pay | Admitting: Family Medicine

## 2020-01-21 ENCOUNTER — Other Ambulatory Visit: Payer: Self-pay

## 2020-01-21 ENCOUNTER — Ambulatory Visit (INDEPENDENT_AMBULATORY_CARE_PROVIDER_SITE_OTHER): Payer: BC Managed Care – PPO | Admitting: Family Medicine

## 2020-01-21 VITALS — BP 129/83 | HR 82 | Temp 98.4°F | Ht 63.0 in | Wt 248.0 lb

## 2020-01-21 DIAGNOSIS — Z6841 Body Mass Index (BMI) 40.0 and over, adult: Secondary | ICD-10-CM

## 2020-01-21 DIAGNOSIS — Z9189 Other specified personal risk factors, not elsewhere classified: Secondary | ICD-10-CM | POA: Diagnosis not present

## 2020-01-21 DIAGNOSIS — I10 Essential (primary) hypertension: Secondary | ICD-10-CM

## 2020-01-21 DIAGNOSIS — E559 Vitamin D deficiency, unspecified: Secondary | ICD-10-CM

## 2020-01-21 DIAGNOSIS — R632 Polyphagia: Secondary | ICD-10-CM

## 2020-01-21 MED ORDER — VITAMIN D (ERGOCALCIFEROL) 1.25 MG (50000 UNIT) PO CAPS: 50000.0000 [IU] | ORAL_CAPSULE | ORAL | 0 refills | Status: DC

## 2020-01-21 MED ORDER — PHENTERMINE HCL 8 MG PO TABS: 8.0000 mg | ORAL_TABLET | Freq: Every day | ORAL | 0 refills | Status: DC

## 2020-01-21 NOTE — Progress Notes (Signed)
Chief Complaint:   OBESITY Jordan Bush is here to discuss her progress with her obesity treatment plan along with follow-up of her obesity related diagnoses. Jordan Bush is on the Category 3 Plan and states she is following her eating plan approximately 90% of the time. Jordan Bush states she is walking/jogging for 30 minutes 3-4 times per week.  Today's visit was #: 8 Starting weight: 266 lbs Starting date: 09/22/2019 Today's weight: 248 lbs Today's date: 01/21/2020 Total lbs lost to date: 18 lbs Total lbs lost since last in-office visit: 3 lbs  Interim History: Jordan Bush is doing well with her diet plan.  She is walking/jogging regularly now.  She says she jogs up hills.  She is using a standing desk.  Subjective:   1. Vitamin D deficiency Jordan Bush's Vitamin D level was 42.4 on 09/22/2019. She is currently taking vit D. She denies nausea, vomiting or muscle weakness.  2. Essential hypertension Review: taking medications as instructed, no medication side effects noted, no chest pain on exertion, no dyspnea on exertion, no swelling of ankles.  At goal.  She is taking Norvasc.  BP Readings from Last 3 Encounters:  01/21/20 129/83  12/31/19 123/80  12/15/19 135/85   3. Polyphagia Jordan Bush endorses excessive hunger.  She is taking phentermine and Wellbutrin.  She says these are still helping with energy and appetite.  No side effects. Blood pressure is at goal.  4. At risk for heart disease Jordan Bush is at a higher than average risk for cardiovascular disease due to obesity. Reviewed: no chest pain on exertion, no dyspnea on exertion, and no swelling of ankles.  Assessment/Plan:   1. Vitamin D deficiency Low Vitamin D level contributes to fatigue and are associated with obesity, breast, and colon cancer. She agrees to continue to take prescription Vitamin D @50 ,000 IU every week and will follow-up for routine testing of Vitamin D, at least 2-3 times per year to avoid  over-replacement.  Orders - Vitamin D, Ergocalciferol, (DRISDOL) 1.25 MG (50000 UNIT) CAPS capsule; Take 1 capsule (50,000 Units total) by mouth every 7 (seven) days.  Dispense: 4 capsule; Refill: 0  2. Essential hypertension Jordan Bush is working on healthy weight loss and exercise to improve blood pressure control. We will watch for signs of hypotension as she continues her lifestyle modifications.  3. Polyphagia Intensive lifestyle modifications are the first line treatment for this issue. We discussed several lifestyle modifications today and she will continue to work on diet, exercise and weight loss efforts. Orders and follow up as documented in patient record.  Counseling . Polyphagia is excessive hunger. . Causes can include: low blood sugars, hypERthyroidism, PMS, lack of sleep, stress, insulin resistance, diabetes, certain medications, and diets that are deficient in protein and fiber.   Orders - Phentermine HCl 8 MG TABS; Take 8 mg by mouth daily.  Dispense: 30 tablet; Refill: 0  4. At risk for heart disease Jordan Bush was given approximately 15 minutes of coronary artery disease prevention counseling today. She is 49 y.o. female and has risk factors for heart disease including obesity. We discussed intensive lifestyle modifications today with an emphasis on specific weight loss instructions and strategies.   Repetitive spaced learning was employed today to elicit superior memory formation and behavioral change.  5. Class 3 severe obesity with serious comorbidity and body mass index (BMI) of 40.0 to 44.9 in adult, unspecified obesity type [facility]) Jordan Bush is currently in the action stage of change. As such, her goal is to continue with  weight loss efforts. She has agreed to the Category 3 Plan.   Exercise goals: As is.  Behavioral modification strategies: increasing lean protein intake and increasing water intake.  Jordan Bush has agreed to follow-up with our clinic in 2 weeks. She was  informed of the importance of frequent follow-up visits to maximize her success with intensive lifestyle modifications for her multiple health conditions.   Objective:   Blood pressure 129/83, pulse 82, temperature 98.4 F (36.9 C), temperature source Oral, height 5\' 3"  (1.6 m), weight 248 lb (112.5 kg), SpO2 99 %. Body mass index is 43.93 kg/m.  General: Cooperative, alert, well developed, in no acute distress. HEENT: Conjunctivae and lids unremarkable. Cardiovascular: Regular rhythm.  Lungs: Normal work of breathing. Neurologic: No focal deficits.   Lab Results  Component Value Date   CREATININE 1.19 12/16/2019   BUN 23 12/16/2019   NA 137 12/16/2019   K 3.9 12/16/2019   CL 103 12/16/2019   CO2 27 12/16/2019   Lab Results  Component Value Date   ALT 18 12/02/2019   AST 13 12/02/2019   ALKPHOS 60 12/02/2019   BILITOT 0.4 12/02/2019   Lab Results  Component Value Date   HGBA1C 5.2 09/22/2019   HGBA1C 5.6 06/27/2017   Lab Results  Component Value Date   INSULIN 32.8 (H) 09/22/2019   Lab Results  Component Value Date   TSH 0.940 09/22/2019   Lab Results  Component Value Date   CHOL 169 12/02/2019   HDL 40.30 12/02/2019   LDLCALC 108 (H) 12/02/2019   TRIG 106.0 12/02/2019   CHOLHDL 4 12/02/2019   Lab Results  Component Value Date   WBC 8.1 12/02/2019   HGB 12.1 12/02/2019   HCT 37.0 12/02/2019   MCV 86.0 12/02/2019   PLT 242.0 12/02/2019   Lab Results  Component Value Date   IRON 50 09/22/2019   TIBC 273 09/22/2019   FERRITIN 226 (H) 09/22/2019   Attestation Statements:   Reviewed by clinician on day of visit: allergies, medications, problem list, medical history, surgical history, family history, social history, and previous encounter notes.  I, Water quality scientist, CMA, am acting as Location manager for [employer], DO.  I have reviewed the above documentation for accuracy and completeness, and I agree with the above. Briscoe Deutscher, DO

## 2020-01-22 ENCOUNTER — Ambulatory Visit: Payer: BC Managed Care – PPO | Attending: Family

## 2020-01-22 DIAGNOSIS — Z23 Encounter for immunization: Secondary | ICD-10-CM

## 2020-01-22 NOTE — Progress Notes (Signed)
   Covid-19 Vaccination Clinic  Name:  Jordan Bush    MRN: [id number] DOB: November 25, 1970  01/22/2020  Jordan Bush was observed post Covid-19 immunization for 30 minutes based on pre-vaccination screening without incident. She was provided with Vaccine Information Sheet and instruction to access the V-Safe system.   Jordan Bush was instructed to call 911 with any severe reactions post vaccine: Marland Kitchen Difficulty breathing  . Swelling of face and throat  . A fast heartbeat  . A bad rash all over body  . Dizziness and weakness   Immunizations Administered    Name Date Dose VIS Date Route   Moderna COVID-19 Vaccine 01/22/2020  4:03 PM 0.5 mL 10/14/2019 Intramuscular   Manufacturer: Moderna   Lot: [id number]   [id number]

## 2020-01-30 ENCOUNTER — Other Ambulatory Visit (INDEPENDENT_AMBULATORY_CARE_PROVIDER_SITE_OTHER): Payer: Self-pay | Admitting: Family Medicine

## 2020-01-30 DIAGNOSIS — R632 Polyphagia: Secondary | ICD-10-CM

## 2020-02-11 ENCOUNTER — Ambulatory Visit (INDEPENDENT_AMBULATORY_CARE_PROVIDER_SITE_OTHER): Payer: BC Managed Care – PPO | Admitting: Family Medicine

## 2020-02-11 ENCOUNTER — Other Ambulatory Visit: Payer: Self-pay

## 2020-02-11 ENCOUNTER — Encounter (INDEPENDENT_AMBULATORY_CARE_PROVIDER_SITE_OTHER): Payer: Self-pay | Admitting: Family Medicine

## 2020-02-11 VITALS — BP 114/78 | HR 74 | Temp 99.0°F | Ht 63.0 in | Wt 248.0 lb

## 2020-02-11 DIAGNOSIS — R632 Polyphagia: Secondary | ICD-10-CM | POA: Diagnosis not present

## 2020-02-11 DIAGNOSIS — Z6841 Body Mass Index (BMI) 40.0 and over, adult: Secondary | ICD-10-CM

## 2020-02-11 DIAGNOSIS — I10 Essential (primary) hypertension: Secondary | ICD-10-CM | POA: Diagnosis not present

## 2020-02-11 DIAGNOSIS — E559 Vitamin D deficiency, unspecified: Secondary | ICD-10-CM | POA: Diagnosis not present

## 2020-02-11 MED ORDER — BUPROPION HCL ER (SR) 150 MG PO TB12: 150.0000 mg | ORAL_TABLET | Freq: Every day | ORAL | 0 refills | Status: DC

## 2020-02-11 MED ORDER — PHENTERMINE HCL 8 MG PO TABS: 8.0000 mg | ORAL_TABLET | Freq: Every day | ORAL | 0 refills | Status: DC

## 2020-02-11 NOTE — Progress Notes (Signed)
Chief Complaint:   OBESITY Jordan Bush is here to discuss her progress with her obesity treatment plan along with follow-up of her obesity related diagnoses. [name] is on the Category 3 Plan and states she is following her eating plan approximately 85% of the time. [name] states she is walking/jogging for 30 minutes 1-2 times per week.  Today's visit was #: 9 Starting weight: 266 lbs Starting date: 09/22/2019 Today's weight: 248 lbs Today's date: 02/11/2020 Total lbs lost to date: 18 lbs Total lbs lost since last in-office visit: 0  Interim History: [name] says that the time change and the inability to refill her phentermine caused her to struggle.  She is now back on track.  She says her clothes are fitting looser.  She is practicing mindful eating and making good choices.  Subjective:   1. Polyphagia [name] endorses excessive hunger.   2. Essential hypertension Review: taking medications as instructed, no medication side effects noted, no chest pain on exertion, no dyspnea on exertion, no swelling of ankles.  Blood pressure is at goal.  BP Readings from Last 3 Encounters:  02/11/20 114/78  01/21/20 129/83  12/31/19 123/80   3. Vitamin D deficiency Jordan Bush's Vitamin D level was 42.4 on 09/22/2019. She is currently taking prescription vitamin D 50,000 IU each week. She denies nausea, vomiting or muscle weakness.  Assessment/Plan:   1. Polyphagia Intensive lifestyle modifications are the first line treatment for this issue. We discussed several lifestyle modifications today and she will continue to work on diet, exercise and weight loss efforts. Orders and follow up as documented in patient record.  Counseling . Polyphagia is excessive hunger. . Causes can include: low blood sugars, hypERthyroidism, PMS, lack of sleep, stress, insulin resistance, diabetes, certain medications, and diets that are deficient in protein and fiber.   Orders - Phentermine HCl 8 MG TABS; Take 8  mg by mouth daily.  Dispense: 30 tablet; Refill: 0 - buPROPion (WELLBUTRIN SR) 150 MG 12 hr tablet; Take 1 tablet (150 mg total) by mouth daily.  Dispense: 30 tablet; Refill: 0  2. Essential hypertension [name] is working on healthy weight loss and exercise to improve blood pressure control. We will watch for signs of hypotension as she continues her lifestyle modifications.  3. Vitamin D deficiency Low Vitamin D level contributes to fatigue and are associated with obesity, breast, and colon cancer. She agrees to continue to take prescription Vitamin D @50 ,000 IU every week and will follow-up for routine testing of Vitamin D, at least 2-3 times per year to avoid over-replacement.  4. Class 3 severe obesity with serious comorbidity and body mass index (BMI) of 40.0 to 44.9 in adult, unspecified obesity type (HCC) Jordan Bush is currently in the action stage of change. As such, her goal is to continue with weight loss efforts. She has agreed to the Category 3 Plan.   Exercise goals: Increase exercise to 3-4 days a week.  Behavioral modification strategies: increasing lean protein intake and increasing water intake.  Jordan Bush has agreed to follow-up with our clinic in 2 weeks. She was informed of the importance of frequent follow-up visits to maximize her success with intensive lifestyle modifications for her multiple health conditions.   Objective:   Blood pressure 114/78, pulse 74, temperature 99 F (37.2 C), temperature source Oral, height 5\' 3"  (1.6 m), weight 248 lb (112.5 kg), SpO2 97 %. Body mass index is 43.93 kg/m.  General: Cooperative, alert, well developed, in no acute distress. HEENT: Conjunctivae and  lids unremarkable. Cardiovascular: Regular rhythm.  Lungs: Normal work of breathing. Neurologic: No focal deficits.   Lab Results  Component Value Date   CREATININE 1.19 12/16/2019   BUN 23 12/16/2019   NA 137 12/16/2019   K 3.9 12/16/2019   CL 103 12/16/2019   CO2 27  12/16/2019   Lab Results  Component Value Date   ALT 18 12/02/2019   AST 13 12/02/2019   ALKPHOS 60 12/02/2019   BILITOT 0.4 12/02/2019   Lab Results  Component Value Date   HGBA1C 5.2 09/22/2019   HGBA1C 5.6 06/27/2017   Lab Results  Component Value Date   INSULIN 32.8 (H) 09/22/2019   Lab Results  Component Value Date   TSH 0.940 09/22/2019   Lab Results  Component Value Date   CHOL 169 12/02/2019   HDL 40.30 12/02/2019   LDLCALC 108 (H) 12/02/2019   TRIG 106.0 12/02/2019   CHOLHDL 4 12/02/2019   Lab Results  Component Value Date   WBC 8.1 12/02/2019   HGB 12.1 12/02/2019   HCT 37.0 12/02/2019   MCV 86.0 12/02/2019   PLT 242.0 12/02/2019   Lab Results  Component Value Date   IRON 50 09/22/2019   TIBC 273 09/22/2019   FERRITIN 226 (H) 09/22/2019   Attestation Statements:   Reviewed by clinician on day of visit: allergies, medications, problem list, medical history, surgical history, family history, social history, and previous encounter notes.  I, Water quality scientist, CMA, am acting as Location manager for [employer], DO.  I have reviewed the above documentation for accuracy and completeness, and I agree with the above. Briscoe Deutscher, DO

## 2020-02-24 ENCOUNTER — Ambulatory Visit: Payer: BC Managed Care – PPO

## 2020-02-24 ENCOUNTER — Ambulatory Visit: Payer: BC Managed Care – PPO | Attending: Family

## 2020-02-24 DIAGNOSIS — Z23 Encounter for immunization: Secondary | ICD-10-CM

## 2020-02-24 NOTE — Progress Notes (Signed)
   Covid-19 Vaccination Clinic  Name:  MINSA WIEMER    MRN: [id number] DOB: 1971-06-23  02/24/2020  Ms. Brotzman was observed post Covid-19 immunization for 15 minutes without incident. She was provided with Vaccine Information Sheet and instruction to access the V-Safe system.   Ms. Rosenthal was instructed to call 911 with any severe reactions post vaccine: Marland Kitchen Difficulty breathing  . Swelling of face and throat  . A fast heartbeat  . A bad rash all over body  . Dizziness and weakness   Immunizations Administered    Name Date Dose VIS Date Route   Moderna COVID-19 Vaccine 02/24/2020  3:51 PM 0.5 mL 10/14/2019 Intramuscular   Manufacturer: Moderna   Lot: [id number]   [id number]

## 2020-03-10 ENCOUNTER — Other Ambulatory Visit: Payer: Self-pay

## 2020-03-10 ENCOUNTER — Ambulatory Visit (INDEPENDENT_AMBULATORY_CARE_PROVIDER_SITE_OTHER): Payer: BC Managed Care – PPO | Admitting: Family Medicine

## 2020-03-10 ENCOUNTER — Encounter (INDEPENDENT_AMBULATORY_CARE_PROVIDER_SITE_OTHER): Payer: Self-pay | Admitting: Family Medicine

## 2020-03-10 VITALS — BP 116/79 | HR 76 | Temp 98.5°F | Ht 63.0 in | Wt 247.0 lb

## 2020-03-10 DIAGNOSIS — I10 Essential (primary) hypertension: Secondary | ICD-10-CM | POA: Diagnosis not present

## 2020-03-10 DIAGNOSIS — E8881 Metabolic syndrome: Secondary | ICD-10-CM

## 2020-03-10 DIAGNOSIS — Z9189 Other specified personal risk factors, not elsewhere classified: Secondary | ICD-10-CM

## 2020-03-10 DIAGNOSIS — F3289 Other specified depressive episodes: Secondary | ICD-10-CM | POA: Diagnosis not present

## 2020-03-10 DIAGNOSIS — E559 Vitamin D deficiency, unspecified: Secondary | ICD-10-CM

## 2020-03-10 DIAGNOSIS — Z6841 Body Mass Index (BMI) 40.0 and over, adult: Secondary | ICD-10-CM

## 2020-03-10 MED ORDER — PHENTERMINE HCL 15 MG PO CAPS: 15.0000 mg | ORAL_CAPSULE | ORAL | 1 refills | Status: DC

## 2020-03-10 NOTE — Progress Notes (Signed)
Chief Complaint:   OBESITY Jordan Bush is here to discuss her progress with her obesity treatment plan along with follow-up of her obesity related diagnoses. Camya is on the Category 3 Plan and states she is following her eating plan approximately 90% of the time. Jordan Bush states she is running for 30 minutes 3 times per week and strength training for 30 minutes 2 times per week.  Today's visit was #: 10 Starting weight: 266 lbs Starting date: 09/22/2019 Today's weight: 247 lbs Today's date: 03/10/2020 Total lbs lost to date: 19 lbs Total lbs lost since last in-office visit: 1 lb  Interim History: Jordan Bush reports that she is in a 10 week training program.  "I'm doing well with that" when asked about diet.  Subjective:   1. Essential hypertension Review: taking medications as instructed, no medication side effects noted, no chest pain on exertion, no dyspnea on exertion, no swelling of ankles.  She is taking Norvasc.  Blood pressure is stable and at goal.  BP Readings from Last 3 Encounters:  03/10/20 116/79  02/11/20 114/78  01/21/20 129/83   2. Vitamin D deficiency Jordan Bush's Vitamin D level was 42.4 on 09/22/2019. She is currently taking prescription vitamin D 50,000 IU each week. She denies nausea, vomiting or muscle weakness.  3. Insulin resistance Jordan Bush has a diagnosis of insulin resistance based on her elevated fasting insulin level >5. She continues to work on diet and exercise to decrease her risk of diabetes.  Lab Results  Component Value Date   INSULIN 32.8 (H) 09/22/2019   Lab Results  Component Value Date   HGBA1C 5.2 09/22/2019   4. Other depression, with emotional eating Jordan Bush is struggling with emotional eating and using food for comfort to the extent that it is negatively impacting her health. She has been working on behavior modification techniques to help reduce her emotional eating and has been successful. She shows no sign of suicidal or homicidal  ideations.  She is taking Wellbutrin and phentermine.  5. At risk for constipation Jordan Bush is at increased risk for constipation due to increasing phentermine.  Jordan Bush denies hard, infrequent stools currently.   Assessment/Plan:   1. Essential hypertension Kent is working on healthy weight loss and exercise to improve blood pressure control. We will watch for signs of hypotension as she continues her lifestyle modifications.  2. Vitamin D deficiency Low Vitamin D level contributes to fatigue and are associated with obesity, breast, and colon cancer. She agrees to continue to take prescription Vitamin D @50 ,000 IU every week and will follow-up for routine testing of Vitamin D, at least 2-3 times per year to avoid over-replacement.  Orders - VITAMIN D 25 Hydroxy (Vit-D Deficiency, Fractures)  3. Insulin resistance Mariauna will continue to work on weight loss, exercise, and decreasing simple carbohydrates to help decrease the risk of diabetes. Markie agreed to follow-up with [name] as directed to closely monitor her progress.  Orders - Comprehensive metabolic panel - CBC with Differential/Platelet - Insulin, random  4. Other depression, with emotional eating Behavior modification techniques were discussed today to help Hollianne deal with her emotional/non-hunger eating behaviors.  Orders and follow up as documented in patient record.   Orders - phentermine 15 MG capsule; Take 1 capsule (15 mg total) by mouth every morning.  Dispense: 30 capsule; Refill: 1  5. At risk for constipation Airalyn was given approximately 15 minutes of counseling today regarding prevention of constipation. She was encouraged to increase water and fiber intake.  6. Class 3 severe obesity with serious comorbidity and body mass index (BMI) of 40.0 to 44.9 in adult, unspecified obesity type (HCC) Jordan Bush is currently in the action stage of change. As such, her goal is to continue with weight loss efforts. She  has agreed to the Category 3 Plan.   Exercise goals: For substantial health benefits, adults should do at least 150 minutes (2 hours and 30 minutes) a week of moderate-intensity, or 75 minutes (1 hour and 15 minutes) a week of vigorous-intensity aerobic physical activity, or an equivalent combination of moderate- and vigorous-intensity aerobic activity. Aerobic activity should be performed in episodes of at least 10 minutes, and preferably, it should be spread throughout the week.  Behavioral modification strategies: increasing water intake.  Jordan Bush has agreed to follow-up with our clinic in 2 weeks. She was informed of the importance of frequent follow-up visits to maximize her success with intensive lifestyle modifications for her multiple health conditions.   Jordan Bush was informed we would discuss her lab results at her next visit unless there is a critical issue that needs to be addressed sooner. Jordan Bush agreed to keep her next visit at the agreed upon time to discuss these results.  Objective:   Blood pressure 116/79, pulse 76, temperature 98.5 F (36.9 C), temperature source Oral, height 5\' 3"  (1.6 m), weight 247 lb (112 kg), SpO2 97 %. Body mass index is 43.75 kg/m.  General: Cooperative, alert, well developed, in no acute distress. HEENT: Conjunctivae and lids unremarkable. Cardiovascular: Regular rhythm.  Lungs: Normal work of breathing. Neurologic: No focal deficits.   Lab Results  Component Value Date   CREATININE 1.19 12/16/2019   BUN 23 12/16/2019   NA 137 12/16/2019   K 3.9 12/16/2019   CL 103 12/16/2019   CO2 27 12/16/2019   Lab Results  Component Value Date   ALT 18 12/02/2019   AST 13 12/02/2019   ALKPHOS 60 12/02/2019   BILITOT 0.4 12/02/2019   Lab Results  Component Value Date   HGBA1C 5.2 09/22/2019   HGBA1C 5.6 06/27/2017   Lab Results  Component Value Date   INSULIN 32.8 (H) 09/22/2019   Lab Results  Component Value Date   TSH 0.940 09/22/2019     Lab Results  Component Value Date   CHOL 169 12/02/2019   HDL 40.30 12/02/2019   LDLCALC 108 (H) 12/02/2019   TRIG 106.0 12/02/2019   CHOLHDL 4 12/02/2019   Lab Results  Component Value Date   WBC 8.1 12/02/2019   HGB 12.1 12/02/2019   HCT 37.0 12/02/2019   MCV 86.0 12/02/2019   PLT 242.0 12/02/2019   Lab Results  Component Value Date   IRON 50 09/22/2019   TIBC 273 09/22/2019   FERRITIN 226 (H) 09/22/2019   Attestation Statements:   Reviewed by clinician on day of visit: allergies, medications, problem list, medical history, surgical history, family history, social history, and previous encounter notes.  I, Water quality scientist, CMA, am acting as Location manager for [employer], DO.  I have reviewed the above documentation for accuracy and completeness, and I agree with the above. Briscoe Deutscher, DO

## 2020-03-11 LAB — CBC WITH DIFFERENTIAL/PLATELET
Basophils Absolute: 0.1 10*3/uL (ref 0.0–0.2)
Basos: 1 %
EOS (ABSOLUTE): 0.2 10*3/uL (ref 0.0–0.4)
Eos: 3 %
Hematocrit: 35.6 % (ref 34.0–46.6)
Hemoglobin: 11.6 g/dL (ref 11.1–15.9)
Immature Grans (Abs): 0 10*3/uL (ref 0.0–0.1)
Immature Granulocytes: 0 %
Lymphocytes Absolute: 2.3 10*3/uL (ref 0.7–3.1)
Lymphs: 39 %
MCH: 28.1 pg (ref 26.6–33.0)
MCHC: 32.6 g/dL (ref 31.5–35.7)
MCV: 86 fL (ref 79–97)
Monocytes Absolute: 0.4 10*3/uL (ref 0.1–0.9)
Monocytes: 7 %
Neutrophils Absolute: 2.9 10*3/uL (ref 1.4–7.0)
Neutrophils: 50 %
Platelets: 235 10*3/uL (ref 150–450)
RBC: 4.13 x10E6/uL (ref 3.77–5.28)
RDW: 13.5 % (ref 11.7–15.4)
WBC: 5.9 10*3/uL (ref 3.4–10.8)

## 2020-03-11 LAB — COMPREHENSIVE METABOLIC PANEL
ALT: 19 IU/L (ref 0–32)
AST: 10 IU/L (ref 0–40)
Albumin/Globulin Ratio: 1.5 (ref 1.2–2.2)
Albumin: 4 g/dL (ref 3.8–4.8)
Alkaline Phosphatase: 71 IU/L (ref 39–117)
BUN/Creatinine Ratio: 13 (ref 9–23)
BUN: 14 mg/dL (ref 6–24)
Bilirubin Total: 0.3 mg/dL (ref 0.0–1.2)
CO2: 23 mmol/L (ref 20–29)
Calcium: 9.2 mg/dL (ref 8.7–10.2)
Chloride: 105 mmol/L (ref 96–106)
Creatinine, Ser: 1.08 mg/dL — ABNORMAL HIGH (ref 0.57–1.00)
GFR calc Af Amer: 71 mL/min/{1.73_m2} (ref >59)
GFR calc non Af Amer: 61 mL/min/{1.73_m2} (ref >59)
Globulin, Total: 2.6 g/dL (ref 1.5–4.5)
Glucose: 102 mg/dL — ABNORMAL HIGH (ref 65–99)
Potassium: 4.3 mmol/L (ref 3.5–5.2)
Sodium: 140 mmol/L (ref 134–144)
Total Protein: 6.6 g/dL (ref 6.0–8.5)

## 2020-03-11 LAB — VITAMIN D 25 HYDROXY (VIT D DEFICIENCY, FRACTURES): Vit D, 25-Hydroxy: 63.1 ng/mL (ref 30.0–100.0)

## 2020-03-11 LAB — INSULIN, RANDOM: INSULIN: 17 u[IU]/mL (ref 2.6–24.9)

## 2020-03-13 ENCOUNTER — Encounter (INDEPENDENT_AMBULATORY_CARE_PROVIDER_SITE_OTHER): Payer: Self-pay | Admitting: Family Medicine

## 2020-03-15 NOTE — Telephone Encounter (Signed)
Please review. Thanks!  

## 2020-03-15 NOTE — Telephone Encounter (Signed)
Please advise on the phentermine.  She is on the 15 mg once daily, and she stated that she took 2 in one day.  Thanks

## 2020-03-16 ENCOUNTER — Other Ambulatory Visit (INDEPENDENT_AMBULATORY_CARE_PROVIDER_SITE_OTHER): Payer: Self-pay | Admitting: *Deleted

## 2020-03-16 DIAGNOSIS — E559 Vitamin D deficiency, unspecified: Secondary | ICD-10-CM

## 2020-03-16 DIAGNOSIS — R632 Polyphagia: Secondary | ICD-10-CM

## 2020-03-16 MED ORDER — VITAMIN D (ERGOCALCIFEROL) 1.25 MG (50000 UNIT) PO CAPS: 50000.0000 [IU] | ORAL_CAPSULE | ORAL | 0 refills | Status: DC

## 2020-03-16 MED ORDER — BUPROPION HCL ER (SR) 150 MG PO TB12: 150.0000 mg | ORAL_TABLET | Freq: Every day | ORAL | 0 refills | Status: DC

## 2020-03-22 DIAGNOSIS — G4733 Obstructive sleep apnea (adult) (pediatric): Secondary | ICD-10-CM | POA: Diagnosis not present

## 2020-04-07 ENCOUNTER — Ambulatory Visit (INDEPENDENT_AMBULATORY_CARE_PROVIDER_SITE_OTHER): Payer: BC Managed Care – PPO | Admitting: Family Medicine

## 2020-04-07 ENCOUNTER — Encounter (INDEPENDENT_AMBULATORY_CARE_PROVIDER_SITE_OTHER): Payer: Self-pay | Admitting: Family Medicine

## 2020-04-07 ENCOUNTER — Other Ambulatory Visit: Payer: Self-pay

## 2020-04-07 VITALS — BP 108/75 | HR 77 | Temp 98.4°F | Ht 63.0 in | Wt 243.0 lb

## 2020-04-07 DIAGNOSIS — E8881 Metabolic syndrome: Secondary | ICD-10-CM

## 2020-04-07 DIAGNOSIS — E559 Vitamin D deficiency, unspecified: Secondary | ICD-10-CM

## 2020-04-07 DIAGNOSIS — F3289 Other specified depressive episodes: Secondary | ICD-10-CM | POA: Diagnosis not present

## 2020-04-07 DIAGNOSIS — I10 Essential (primary) hypertension: Secondary | ICD-10-CM | POA: Diagnosis not present

## 2020-04-07 DIAGNOSIS — Z9189 Other specified personal risk factors, not elsewhere classified: Secondary | ICD-10-CM | POA: Diagnosis not present

## 2020-04-07 DIAGNOSIS — Z6841 Body Mass Index (BMI) 40.0 and over, adult: Secondary | ICD-10-CM

## 2020-04-07 MED ORDER — PHENTERMINE HCL 15 MG PO CAPS: 15.0000 mg | ORAL_CAPSULE | ORAL | 1 refills | Status: DC

## 2020-04-07 MED ORDER — BUPROPION HCL ER (SR) 150 MG PO TB12: 150.0000 mg | ORAL_TABLET | Freq: Every day | ORAL | 0 refills | Status: DC

## 2020-04-07 NOTE — Progress Notes (Signed)
Chief Complaint:   OBESITY Jordan Bush is here to discuss her progress with her obesity treatment plan along with follow-up of her obesity related diagnoses. [name] is on the Category 3 Plan and states she is following her eating plan approximately 80% of the time. 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  Today's visit was #: 11 Starting weight: 266 lbs Starting date: 03/10/2020 Today's weight: 243 lbs Today's date: 04/07/2020 Total lbs lost to date: 23 lbs Total lbs lost since last in-office visit: 4 lbs  Interim History: Jordan Bush is down 23 pounds.  She went on vacation and says she ate the foods that she craved, but exercised regularly.  Subjective:   1. Insulin resistance Jordan Bush has a diagnosis of insulin resistance based on her elevated fasting insulin level >5. She continues to work on diet and exercise to decrease her risk of diabetes.  Jordan Bush is doing well.  Insulin level has decreased from 32.8 to 17.0.  Lab Results  Component Value Date   INSULIN 17.0 03/10/2020   INSULIN 32.8 (H) 09/22/2019   Lab Results  Component Value Date   HGBA1C 5.2 09/22/2019   2. Vitamin D deficiency Jordan Bush's Vitamin D level was 63.1 on 03/10/2020. She is currently taking prescription vitamin D 50,000 IU each week. She denies nausea, vomiting or muscle weakness.  Vitamin D level is at goal.  3. Essential hypertension Review: taking medications as instructed, no medication side effects noted, no chest pain on exertion, no dyspnea on exertion, no swelling of ankles.  She is taking Norvasc 5 mg daily.  Blood pressure is 108/75, heart rate 77 today.  BP Readings from Last 3 Encounters:  04/07/20 108/75  03/10/20 116/79  02/11/20 114/78   4. Other depression, with emotional eating Jakiria is struggling with emotional eating and using food for comfort to the extent that it is negatively impacting her health.  She has been working on behavior modification techniques to help reduce her emotional eating and has been successful. She shows no sign of suicidal or homicidal ideations.  She is taking Wellbutrin 150 mg daily and phentermine 15 mg daily.  5. At risk for complication associated with hypotension Jordan Bush is at increased risk for constipation due to inadequate water intake, changes in diet, and/or use of medications such as GLP1 agonists. Jordan Bush denies hard, infrequent stools currently.   Assessment/Plan:   1. Insulin resistance Jordan Bush will continue to work on weight loss, exercise, and decreasing simple carbohydrates to help decrease the risk of diabetes. Jordan Bush agreed to follow-up with Jordan Bush as directed to closely monitor her progress.  2. Vitamin D deficiency Low Vitamin D level contributes to fatigue and are associated with obesity, breast, and colon cancer. She agrees to continue to take OTC Vitamin D @2 ,000 IU daily and will follow-up for routine testing of Vitamin D, at least 2-3 times per year to avoid over-replacement.  3. Essential hypertension Jordan Bush is working on healthy weight loss and exercise to improve blood pressure control. We will watch for signs of hypotension as she continues her lifestyle modifications.  Jordan Bush will watch for orthostatic symptoms and consider decreasing Norvasc by 1/2 tablet daily if they continue.  4. Other depression, with emotional eating Behavior modification techniques were discussed today to help Jordan Bush deal with her emotional/non-hunger eating behaviors.  Orders and follow up as documented in patient record.   Orders - phentermine 15 MG capsule;  Take 1 capsule (15 mg total) by mouth every morning.  Dispense: 30 capsule; Refill: 1 - buPROPion (WELLBUTRIN SR) 150 MG 12 hr tablet; Take 1 tablet (150 mg total) by mouth daily.  Dispense: 30 tablet; Refill: 0  5. At risk for complication associated with hypotension Jordan Bush was given approximately 15  minutes of counseling today regarding prevention of constipation. She was encouraged to increase water and fiber intake.   6. Class 3 severe obesity with serious comorbidity and body mass index (BMI) of 40.0 to 44.9 in adult, unspecified obesity type (HCC) Jordan Bush is currently in the action stage of change. As such, her goal is to continue with weight loss efforts. She has agreed to the Category 3 Plan.   Exercise goals: For substantial health benefits, adults should do at least 150 minutes (2 hours and 30 minutes) a week of moderate-intensity, or 75 minutes (1 hour and 15 minutes) a week of vigorous-intensity aerobic physical activity, or an equivalent combination of moderate- and vigorous-intensity aerobic activity. Aerobic activity should be performed in episodes of at least 10 minutes, and preferably, it should be spread throughout the week.  Behavioral modification strategies: increasing lean protein intake and increasing water intake.  Jordan Bush has agreed to follow-up with our clinic in 2 weeks. She was informed of the importance of frequent follow-up visits to maximize her success with intensive lifestyle modifications for her multiple health conditions.   Objective:   Blood pressure 108/75, pulse 77, temperature 98.4 F (36.9 C), temperature source Oral, height 5\' 3"  (1.6 m), weight 243 lb (110.2 kg), SpO2 97 %. Body mass index is 43.05 kg/m.  General: Cooperative, alert, well developed, in no acute distress. HEENT: Conjunctivae and lids unremarkable. Cardiovascular: Regular rhythm.  Lungs: Normal work of breathing. Neurologic: No focal deficits.   Lab Results  Component Value Date   CREATININE 1.08 (H) 03/10/2020   BUN 14 03/10/2020   NA 140 03/10/2020   K 4.3 03/10/2020   CL 105 03/10/2020   CO2 23 03/10/2020   Lab Results  Component Value Date   ALT 19 03/10/2020   AST 10 03/10/2020   ALKPHOS 71 03/10/2020   BILITOT 0.3 03/10/2020   Lab Results  Component Value Date    HGBA1C 5.2 09/22/2019   HGBA1C 5.6 06/27/2017   Lab Results  Component Value Date   INSULIN 17.0 03/10/2020   INSULIN 32.8 (H) 09/22/2019   Lab Results  Component Value Date   TSH 0.940 09/22/2019   Lab Results  Component Value Date   CHOL 169 12/02/2019   HDL 40.30 12/02/2019   LDLCALC 108 (H) 12/02/2019   TRIG 106.0 12/02/2019   CHOLHDL 4 12/02/2019   Lab Results  Component Value Date   WBC 5.9 03/10/2020   HGB 11.6 03/10/2020   HCT 35.6 03/10/2020   MCV 86 03/10/2020   PLT 235 03/10/2020   Lab Results  Component Value Date   IRON 50 09/22/2019   TIBC 273 09/22/2019   FERRITIN 226 (H) 09/22/2019   Attestation Statements:   Reviewed by clinician on day of visit: allergies, medications, problem list, medical history, surgical history, family history, social history, and previous encounter notes.  I, Water quality scientist, CMA, am acting as Location manager for [employer], DO.  I have reviewed the above documentation for accuracy and completeness, and I agree with the above. Briscoe Deutscher, DO

## 2020-04-16 ENCOUNTER — Encounter (INDEPENDENT_AMBULATORY_CARE_PROVIDER_SITE_OTHER): Payer: Self-pay | Admitting: Family Medicine

## 2020-04-20 NOTE — Telephone Encounter (Signed)
Could you f/u on the refill

## 2020-05-05 ENCOUNTER — Encounter (INDEPENDENT_AMBULATORY_CARE_PROVIDER_SITE_OTHER): Payer: Self-pay | Admitting: Physician Assistant

## 2020-05-05 ENCOUNTER — Ambulatory Visit (INDEPENDENT_AMBULATORY_CARE_PROVIDER_SITE_OTHER): Payer: BC Managed Care – PPO | Admitting: Family Medicine

## 2020-05-05 ENCOUNTER — Ambulatory Visit (INDEPENDENT_AMBULATORY_CARE_PROVIDER_SITE_OTHER): Payer: BC Managed Care – PPO | Admitting: Physician Assistant

## 2020-05-05 ENCOUNTER — Other Ambulatory Visit: Payer: Self-pay

## 2020-05-05 VITALS — BP 114/78 | HR 70 | Temp 98.1°F | Ht 63.0 in | Wt 240.0 lb

## 2020-05-05 DIAGNOSIS — Z9189 Other specified personal risk factors, not elsewhere classified: Secondary | ICD-10-CM | POA: Diagnosis not present

## 2020-05-05 DIAGNOSIS — E559 Vitamin D deficiency, unspecified: Secondary | ICD-10-CM | POA: Diagnosis not present

## 2020-05-05 DIAGNOSIS — Z6841 Body Mass Index (BMI) 40.0 and over, adult: Secondary | ICD-10-CM

## 2020-05-05 DIAGNOSIS — F3289 Other specified depressive episodes: Secondary | ICD-10-CM

## 2020-05-05 MED ORDER — BUPROPION HCL ER (SR) 150 MG PO TB12: 150.0000 mg | ORAL_TABLET | Freq: Every day | ORAL | 0 refills | Status: DC

## 2020-05-05 NOTE — Progress Notes (Signed)
Chief Complaint:   OBESITY Jordan Bush is here to discuss her progress with her obesity treatment plan along with follow-up of her obesity related diagnoses. Jordan Bush is on the Category 3 Plan and states she is following her eating plan approximately 85% of the time. Jordan Bush states she is running/cross training 45 minutes 5 times per week.  Today's visit was #: 12 Starting weight: 266 lbs Starting date: 09/22/2019 Today's weight: 240 lbs Today's date: 05/05/2020 Total lbs lost to date: 26 Total lbs lost since last in-office visit: 3  Interim History: Jordan Bush is doing very well with weight loss. She sometimes is not getting in all of her protein at lunch due to a busy work schedule. She is not weighing her protein consistently.  Subjective:   Other depression, with emotional eating. Jordan Bush is struggling with emotional eating and using food for comfort to the extent that it is negatively impacting her health. She has been working on behavior modification techniques to help reduce her emotional eating and has been somewhat successful. She shows no sign of suicidal or homicidal ideations. Jordan Bush is on bupropion. Blood pressure is normal.  Vitamin D deficiency. Jordan Bush is on Vitamin D 5,000 units daily. No nausea, vomiting, or muscle weakness. Last Vitamin D was 63.1 on 03/10/2020.  At risk for osteoporosis. Jordan Bush is at higher risk of osteopenia and osteoporosis due to Vitamin D deficiency.   Assessment/Plan:   Other depression, with emotional eating. Behavior modification techniques were discussed today to help Jordan Bush deal with her emotional/non-hunger eating behaviors.  Orders and follow up as documented in patient record. Refill was given for buPROPion (WELLBUTRIN SR) 150 MG 12 hr tablet #30 with 0 refills.  Vitamin D deficiency. Low Vitamin D level contributes to fatigue and are associated with obesity, breast, and colon cancer. She agrees to continue to take Vitamin D and  will follow-up for routine testing of Vitamin D, at least 2-3 times per year to avoid over-replacement.  At risk for osteoporosis. Jordan Bush was given approximately 15 minutes of osteoporosis prevention counseling today. Jordan Bush is at risk for osteopenia and osteoporosis due to her Vitamin D deficiency. She was encouraged to take her Vitamin D and follow her higher calcium diet and increase strengthening exercise to help strengthen her bones and decrease her risk of osteopenia and osteoporosis.  Repetitive spaced learning was employed today to elicit superior memory formation and behavioral change.  Class 3 severe obesity with serious comorbidity and body mass index (BMI) of 40.0 to 44.9 in adult, unspecified obesity type (Jordan Bush).  Jordan Bush is currently in the action stage of change. As such, her goal is to continue with weight loss efforts. She has agreed to the Category 3 Plan.   Exercise goals: For substantial health benefits, adults should do at least 150 minutes (2 hours and 30 minutes) a week of moderate-intensity, or 75 minutes (1 hour and 15 minutes) a week of vigorous-intensity aerobic physical activity, or an equivalent combination of moderate- and vigorous-intensity aerobic activity. Aerobic activity should be performed in episodes of at least 10 minutes, and preferably, it should be spread throughout the week.  Behavioral modification strategies: meal planning and cooking strategies and keeping healthy foods in the home.  Jordan Bush has agreed to follow-up with our clinic in 3-4 weeks. She was informed of the importance of frequent follow-up visits to maximize her success with intensive lifestyle modifications for her multiple health conditions.   Objective:   Blood pressure 114/78, pulse 70, temperature  98.1 F (36.7 C), temperature source Oral, height 5\' 3"  (1.6 m), weight 240 lb (108.9 kg), SpO2 100 %. Body mass index is 42.51 kg/m.  General: Cooperative, alert, well developed, in no  acute distress. HEENT: Conjunctivae and lids unremarkable. Cardiovascular: Regular rhythm.  Lungs: Normal work of breathing. Neurologic: No focal deficits.   Lab Results  Component Value Date   CREATININE 1.08 (H) 03/10/2020   BUN 14 03/10/2020   NA 140 03/10/2020   K 4.3 03/10/2020   CL 105 03/10/2020   CO2 23 03/10/2020   Lab Results  Component Value Date   ALT 19 03/10/2020   AST 10 03/10/2020   ALKPHOS 71 03/10/2020   BILITOT 0.3 03/10/2020   Lab Results  Component Value Date   HGBA1C 5.2 09/22/2019   HGBA1C 5.6 06/27/2017   Lab Results  Component Value Date   INSULIN 17.0 03/10/2020   INSULIN 32.8 (H) 09/22/2019   Lab Results  Component Value Date   TSH 0.940 09/22/2019   Lab Results  Component Value Date   CHOL 169 12/02/2019   HDL 40.30 12/02/2019   LDLCALC 108 (H) 12/02/2019   TRIG 106.0 12/02/2019   CHOLHDL 4 12/02/2019   Lab Results  Component Value Date   WBC 5.9 03/10/2020   HGB 11.6 03/10/2020   HCT 35.6 03/10/2020   MCV 86 03/10/2020   PLT 235 03/10/2020   Lab Results  Component Value Date   IRON 50 09/22/2019   TIBC 273 09/22/2019   FERRITIN 226 (H) 09/22/2019   Attestation Statements:   Reviewed by clinician on day of visit: allergies, medications, problem list, medical history, surgical history, family history, social history, and previous encounter notes.  IMichaelene Bush, am acting as transcriptionist for Jordan Potash, PA-C   I have reviewed the above documentation for accuracy and completeness, and I agree with the above. Jordan Potash, PA-C

## 2020-05-26 ENCOUNTER — Encounter (INDEPENDENT_AMBULATORY_CARE_PROVIDER_SITE_OTHER): Payer: Self-pay | Admitting: Family Medicine

## 2020-05-26 DIAGNOSIS — F3289 Other specified depressive episodes: Secondary | ICD-10-CM

## 2020-05-26 MED ORDER — PHENTERMINE HCL 15 MG PO CAPS: 15.0000 mg | ORAL_CAPSULE | ORAL | 1 refills | Status: DC

## 2020-05-26 NOTE — Telephone Encounter (Signed)
Please advise about refill

## 2020-05-27 ENCOUNTER — Encounter: Payer: Self-pay | Admitting: Genetic Counselor

## 2020-05-31 ENCOUNTER — Ambulatory Visit: Payer: BC Managed Care – PPO | Admitting: Family Medicine

## 2020-06-02 ENCOUNTER — Other Ambulatory Visit (INDEPENDENT_AMBULATORY_CARE_PROVIDER_SITE_OTHER): Payer: Self-pay | Admitting: Physician Assistant

## 2020-06-02 ENCOUNTER — Encounter (INDEPENDENT_AMBULATORY_CARE_PROVIDER_SITE_OTHER): Payer: Self-pay | Admitting: Family Medicine

## 2020-06-02 ENCOUNTER — Other Ambulatory Visit: Payer: Self-pay

## 2020-06-02 ENCOUNTER — Ambulatory Visit (INDEPENDENT_AMBULATORY_CARE_PROVIDER_SITE_OTHER): Payer: BC Managed Care – PPO | Admitting: Family Medicine

## 2020-06-02 ENCOUNTER — Other Ambulatory Visit: Payer: Self-pay | Admitting: Family Medicine

## 2020-06-02 VITALS — BP 111/76 | HR 80 | Temp 98.5°F | Ht 63.0 in | Wt 241.0 lb

## 2020-06-02 DIAGNOSIS — F3289 Other specified depressive episodes: Secondary | ICD-10-CM | POA: Diagnosis not present

## 2020-06-02 DIAGNOSIS — Z9189 Other specified personal risk factors, not elsewhere classified: Secondary | ICD-10-CM | POA: Diagnosis not present

## 2020-06-02 DIAGNOSIS — E8881 Metabolic syndrome: Secondary | ICD-10-CM

## 2020-06-02 DIAGNOSIS — I10 Essential (primary) hypertension: Secondary | ICD-10-CM

## 2020-06-02 DIAGNOSIS — Z6841 Body Mass Index (BMI) 40.0 and over, adult: Secondary | ICD-10-CM

## 2020-06-02 MED ORDER — BUPROPION HCL ER (SR) 150 MG PO TB12: 150.0000 mg | ORAL_TABLET | Freq: Every day | ORAL | 0 refills | Status: DC

## 2020-06-02 NOTE — Progress Notes (Signed)
Chief Complaint:   OBESITY Jordan Bush is here to discuss her progress with her obesity treatment plan along with follow-up of her obesity related diagnoses. Jordan Bush is on the Category 3 Plan and states she is following her eating plan approximately 85-90% of the time. [name] states she is walking for 30 minutes 3 times per week.  Today's visit was #: 11 Starting weight: 266 lbs Starting date: 09/22/2019 Today's weight: 241 lbs Today's date: 06/02/2020 Total lbs lost to date: 25 lbs Total lbs lost since last in-office visit: 1 lb  Interim History: [name] endorses increased hunger.  She says she is getting her protein in.  Denies skipping meals.  She ran a 5K on her birthday, but has not run since.  She still has increased stress at work.  IC at next visit.  Subjective:   1. Insulin resistance [name] has a diagnosis of insulin resistance based on her elevated fasting insulin level >5. She continues to work on diet and exercise to decrease her risk of diabetes.  A1c shows an improvement of 17.0 from 32.8.    Lab Results  Component Value Date   INSULIN 17.0 03/10/2020   INSULIN 32.8 (H) 09/22/2019   Lab Results  Component Value Date   HGBA1C 5.2 09/22/2019   2. Other depression, with emotional eating Jordan Bush is struggling with emotional eating and using food for comfort to the extent that it is negatively impacting her health. She has been working on behavior modification techniques to help reduce her emotional eating and has been successful. She shows no sign of suicidal or homicidal ideations.  Assessment/Plan:   1. Insulin resistance [name] will continue to work on weight loss, exercise, and decreasing simple carbohydrates to help decrease the risk of diabetes. [name] agreed to follow-up with [name] as directed to closely monitor her progress.  She prefers no metformin.  2. Other depression, with emotional eating Behavior modification techniques were discussed today to help  Jordan Bush deal with her emotional/non-hunger eating behaviors.  Orders and follow up as documented in patient record.   - buPROPion (WELLBUTRIN SR) 150 MG 12 hr tablet; Take 1 tablet (150 mg total) by mouth daily.  Dispense: 30 tablet; Refill: 0  3. At risk for deficient intake of food Jordan Bush was given approximately 15 minutes of deficit intake of food prevention counseling today. Jordan Bush is at risk for eating too few calories based on current food recall. She was encouraged to focus on meeting caloric and protein goals according to her recommended meal plan.   4. Class 3 severe obesity with serious comorbidity and body mass index (BMI) of 40.0 to 44.9 in adult, unspecified obesity type (HCC) [name] is currently in the action stage of change. As such, her goal is to continue with weight loss efforts. She has agreed to the Category 3 Plan.   Exercise goals: For substantial health benefits, adults should do at least 150 minutes (2 hours and 30 minutes) a week of moderate-intensity, or 75 minutes (1 hour and 15 minutes) a week of vigorous-intensity aerobic physical activity, or an equivalent combination of moderate- and vigorous-intensity aerobic activity. Aerobic activity should be performed in episodes of at least 10 minutes, and preferably, it should be spread throughout the week.  Behavioral modification strategies: increasing lean protein intake.  Jordan Bush has agreed to follow-up with our clinic in 4 weeks. She was informed of the importance of frequent follow-up visits to maximize her success with intensive lifestyle modifications for her multiple health conditions.  Objective:   Blood pressure 111/76, pulse 80, temperature 98.5 F (36.9 C), temperature source Oral, height 5\' 3"  (1.6 m), weight 241 lb (109.3 kg), SpO2 100 %. Body mass index is 42.69 kg/m.  General: Cooperative, alert, well developed, in no acute distress. HEENT: Conjunctivae and lids unremarkable. Cardiovascular: Regular  rhythm.  Lungs: Normal work of breathing. Neurologic: No focal deficits.   Lab Results  Component Value Date   CREATININE 1.08 (H) 03/10/2020   BUN 14 03/10/2020   NA 140 03/10/2020   K 4.3 03/10/2020   CL 105 03/10/2020   CO2 23 03/10/2020   Lab Results  Component Value Date   ALT 19 03/10/2020   AST 10 03/10/2020   ALKPHOS 71 03/10/2020   BILITOT 0.3 03/10/2020   Lab Results  Component Value Date   HGBA1C 5.2 09/22/2019   HGBA1C 5.6 06/27/2017   Lab Results  Component Value Date   INSULIN 17.0 03/10/2020   INSULIN 32.8 (H) 09/22/2019   Lab Results  Component Value Date   TSH 0.940 09/22/2019   Lab Results  Component Value Date   CHOL 169 12/02/2019   HDL 40.30 12/02/2019   LDLCALC 108 (H) 12/02/2019   TRIG 106.0 12/02/2019   CHOLHDL 4 12/02/2019   Lab Results  Component Value Date   WBC 5.9 03/10/2020   HGB 11.6 03/10/2020   HCT 35.6 03/10/2020   MCV 86 03/10/2020   PLT 235 03/10/2020   Lab Results  Component Value Date   IRON 50 09/22/2019   TIBC 273 09/22/2019   FERRITIN 226 (H) 09/22/2019   Attestation Statements:   Reviewed by clinician on day of visit: allergies, medications, problem list, medical history, surgical history, family history, social history, and previous encounter notes.  I, Water quality scientist, CMA, am acting as transcriptionist for Briscoe Deutscher, DO  I have reviewed the above documentation for accuracy and completeness, and I agree with the above. Briscoe Deutscher, DO

## 2020-06-08 ENCOUNTER — Other Ambulatory Visit: Payer: Self-pay

## 2020-06-08 ENCOUNTER — Ambulatory Visit: Payer: BC Managed Care – PPO | Admitting: Family Medicine

## 2020-06-08 ENCOUNTER — Encounter: Payer: Self-pay | Admitting: Family Medicine

## 2020-06-08 VITALS — BP 120/80 | HR 98 | Temp 98.6°F | Ht 62.0 in | Wt 244.5 lb

## 2020-06-08 DIAGNOSIS — J3089 Other allergic rhinitis: Secondary | ICD-10-CM | POA: Diagnosis not present

## 2020-06-08 DIAGNOSIS — J4520 Mild intermittent asthma, uncomplicated: Secondary | ICD-10-CM

## 2020-06-08 DIAGNOSIS — I10 Essential (primary) hypertension: Secondary | ICD-10-CM | POA: Diagnosis not present

## 2020-06-08 MED ORDER — LEVOCETIRIZINE DIHYDROCHLORIDE 5 MG PO TABS: 5.0000 mg | ORAL_TABLET | Freq: Every day | ORAL | 3 refills | Status: DC
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
## 2020-06-08 NOTE — Patient Instructions (Addendum)
Strong work with the weight loss.  Consider taking progress photos of yourself for a way to see how well you are doing and for an accountability check in.   Keep the diet clean and stay active.  Singulair might be a good "as needed" medication for you when allergies/flare.   Consider checking your blood pressure 2-3 times per month to make sure you don't drop too low. If you are <110/70 routinely, let me know.  Let us know if you need anything.

## 2020-06-30 ENCOUNTER — Ambulatory Visit (INDEPENDENT_AMBULATORY_CARE_PROVIDER_SITE_OTHER): Payer: BC Managed Care – PPO | Admitting: Family Medicine

## 2020-06-30 ENCOUNTER — Other Ambulatory Visit (INDEPENDENT_AMBULATORY_CARE_PROVIDER_SITE_OTHER): Payer: Self-pay | Admitting: Family Medicine

## 2020-06-30 ENCOUNTER — Encounter (INDEPENDENT_AMBULATORY_CARE_PROVIDER_SITE_OTHER): Payer: Self-pay | Admitting: Family Medicine

## 2020-06-30 ENCOUNTER — Other Ambulatory Visit: Payer: Self-pay

## 2020-06-30 VITALS — BP 126/84 | HR 95 | Temp 98.4°F | Ht 63.0 in | Wt 240.0 lb

## 2020-06-30 DIAGNOSIS — E8881 Metabolic syndrome: Secondary | ICD-10-CM

## 2020-06-30 DIAGNOSIS — Z9189 Other specified personal risk factors, not elsewhere classified: Secondary | ICD-10-CM | POA: Diagnosis not present

## 2020-06-30 DIAGNOSIS — F3289 Other specified depressive episodes: Secondary | ICD-10-CM | POA: Diagnosis not present

## 2020-06-30 DIAGNOSIS — Z6841 Body Mass Index (BMI) 40.0 and over, adult: Secondary | ICD-10-CM

## 2020-06-30 DIAGNOSIS — I10 Essential (primary) hypertension: Secondary | ICD-10-CM | POA: Diagnosis not present

## 2020-06-30 DIAGNOSIS — R0602 Shortness of breath: Secondary | ICD-10-CM

## 2020-06-30 MED ORDER — SAXENDA 18 MG/3ML ~~LOC~~ SOPN: 3.0000 mg | PEN_INJECTOR | Freq: Every day | SUBCUTANEOUS | 0 refills | Status: DC

## 2020-06-30 MED ORDER — PHENTERMINE HCL 15 MG PO CAPS: 15.0000 mg | ORAL_CAPSULE | ORAL | 1 refills | Status: DC

## 2020-06-30 MED ORDER — INSULIN PEN NEEDLE 32G X 4 MM MISC: 1.0000 | Freq: Every day | 0 refills | Status: DC

## 2020-06-30 NOTE — Progress Notes (Signed)
Chief Complaint:   OBESITY Jordan Bush is here to discuss her progress with her obesity treatment plan along with follow-up of her obesity related diagnoses. [name] is on the Category 3 Plan and states she is following her eating plan approximately 90% of the time. [name] states she is walking for 30-45 minutes 3 times per week.  Today's visit was #: 14 Starting weight: 266 lbs Starting date: 09/22/2019 Today's weight: 240 lbs Today's date: 06/30/2020 Total lbs lost to date: 26 lbs Total lbs lost since last in-office visit: 1 lb Total weight loss percentage to date: -9.77%  Interim History: [name] says she is still making healthy choices for meals.  She struggles with wanting something sweet after each meal.  She says that her sleep is poor on some nights due to stress.  Subjective:   1. Insulin resistance Jordan Bush has a diagnosis of insulin resistance based on her elevated fasting insulin level >5.   Lab Results  Component Value Date   INSULIN 17.0 03/10/2020   INSULIN 32.8 (H) 09/22/2019   Lab Results  Component Value Date   HGBA1C 5.2 09/22/2019   2. Essential hypertension Review: taking medications as instructed, no medication side effects noted, no chest pain on exertion, no dyspnea on exertion, no swelling of ankles.  Blood pressure is at goal.  BP Readings from Last 3 Encounters:  06/30/20 126/84  06/08/20 120/80  06/02/20 111/76   3. Shortness of breath on exertion Jordan Bush endorses shortness of breath on exertion.  IC today.  4. Other depression, with emotional eating Jordan Bush is struggling with emotional eating and using food for comfort to the extent that it is negatively impacting her health. She has been working on behavior modification techniques to help reduce her emotional eating and has been successful. She shows no sign of suicidal or homicidal ideations.  Assessment/Plan:   1. Insulin resistance Improving, but not at goal. Goal is HgbA1c < 5.7 and  insulin level closer to 5. Candise will continue to work on weight loss, exercise, and decreasing simple carbohydrates to help decrease the risk of diabetes. Tiani agreed to follow-up with [name] as directed to closely monitor her progress.  2. Essential hypertension The current medical regimen is effective;  continue present plan and medications.  3. Shortness of breath on exertion Jordan Bush had a new IC performed today.   4. Other depression, with emotional eating Behavior modification techniques were discussed today to help Jordan Bush deal with her emotional/non-hunger eating behaviors.  Orders and follow up as documented in patient record.   5. At risk for heart disease Jordan Bush was given approximately 15 minutes of coronary artery disease prevention counseling today. She is 49 y.o. female and has risk factors for heart disease including obesity. We discussed intensive lifestyle modifications today with an emphasis on specific weight loss instructions and strategies.   Repetitive spaced learning was employed today to elicit superior memory formation and behavioral change.  6. Class 3 severe obesity with serious comorbidity and body mass index (BMI) of 40.0 to 44.9 in adult, unspecified obesity type ([place]) The current combination of medical regimen is effective;  continue present plan and medications. Risk versus benefits of medication reviewed again. The patient understands monitoring parameters and red flags. I have consulted the [place] Controlled Substances Registry for this patient, and feel the risk/benefit ratio today is favorable for proceeding with this prescription for a controlled substance.    Orders - phentermine 15 MG capsule; Take 1 capsule (15 mg total)  by mouth every morning.  Dispense: 30 capsule; Refill: 1 - Liraglutide -Weight Management (SAXENDA) 18 MG/3ML SOPN; Inject 0.5 mLs (3 mg total) into the skin daily.  Dispense: 15 mL; Refill: 0  Start with 0.6 mg subcu daily for the firsts  week, then increase to 1.2 mg if tolerating.  - Insulin Pen Needle 32G X 4 MM MISC; 1 each by Does not apply route daily.  Dispense: 100 each; Refill: 0  Jordan Bush is currently in the action stage of change. As such, her goal is to continue with weight loss efforts. She has agreed to the Category 3 Plan.   Exercise goals: For substantial health benefits, adults should do at least 150 minutes (2 hours and 30 minutes) a week of moderate-intensity, or 75 minutes (1 hour and 15 minutes) a week of vigorous-intensity aerobic physical activity, or an equivalent combination of moderate- and vigorous-intensity aerobic activity. Aerobic activity should be performed in episodes of at least 10 minutes, and preferably, it should be spread throughout the week.  Behavioral modification strategies: increasing lean protein intake, decreasing simple carbohydrates and increasing vegetables.  Jordan Bush has agreed to follow-up with our clinic in 2-3 weeks. She was informed of the importance of frequent follow-up visits to maximize her success with intensive lifestyle modifications for her multiple health conditions.   Objective:   Blood pressure 126/84, pulse 95, temperature 98.4 F (36.9 C), temperature source Oral, height 5\' 3"  (1.6 m), weight 240 lb (108.9 kg), SpO2 95 %. Body mass index is 42.51 kg/m.  General: Cooperative, alert, well developed, in no acute distress. HEENT: Conjunctivae and lids unremarkable. Cardiovascular: Regular rhythm.  Lungs: Normal work of breathing. Neurologic: No focal deficits.   Lab Results  Component Value Date   CREATININE 1.08 (H) 03/10/2020   BUN 14 03/10/2020   NA 140 03/10/2020   K 4.3 03/10/2020   CL 105 03/10/2020   CO2 23 03/10/2020   Lab Results  Component Value Date   ALT 19 03/10/2020   AST 10 03/10/2020   ALKPHOS 71 03/10/2020   BILITOT 0.3 03/10/2020   Lab Results  Component Value Date   HGBA1C 5.2 09/22/2019   HGBA1C 5.6 06/27/2017   Lab Results    Component Value Date   INSULIN 17.0 03/10/2020   INSULIN 32.8 (H) 09/22/2019   Lab Results  Component Value Date   TSH 0.940 09/22/2019   Lab Results  Component Value Date   CHOL 169 12/02/2019   HDL 40.30 12/02/2019   LDLCALC 108 (H) 12/02/2019   TRIG 106.0 12/02/2019   CHOLHDL 4 12/02/2019   Lab Results  Component Value Date   WBC 5.9 03/10/2020   HGB 11.6 03/10/2020   HCT 35.6 03/10/2020   MCV 86 03/10/2020   PLT 235 03/10/2020   Lab Results  Component Value Date   IRON 50 09/22/2019   TIBC 273 09/22/2019   FERRITIN 226 (H) 09/22/2019   Attestation Statements:   Reviewed by clinician on day of visit: allergies, medications, problem list, medical history, surgical history, family history, social history, and previous encounter notes.  I, Water quality scientist, CMA, am acting as transcriptionist for Briscoe Deutscher, DO  I have reviewed the above documentation for accuracy and completeness, and I agree with the above. Briscoe Deutscher, DO

## 2020-07-06 ENCOUNTER — Encounter (INDEPENDENT_AMBULATORY_CARE_PROVIDER_SITE_OTHER): Payer: Self-pay | Admitting: Family Medicine

## 2020-07-26 ENCOUNTER — Other Ambulatory Visit: Payer: Self-pay

## 2020-07-26 ENCOUNTER — Ambulatory Visit (INDEPENDENT_AMBULATORY_CARE_PROVIDER_SITE_OTHER): Payer: BC Managed Care – PPO | Admitting: Family Medicine

## 2020-07-26 ENCOUNTER — Encounter (INDEPENDENT_AMBULATORY_CARE_PROVIDER_SITE_OTHER): Payer: Self-pay | Admitting: Family Medicine

## 2020-07-26 VITALS — BP 106/74 | HR 78 | Temp 98.6°F | Ht 63.0 in | Wt 239.0 lb

## 2020-07-26 DIAGNOSIS — Z9189 Other specified personal risk factors, not elsewhere classified: Secondary | ICD-10-CM

## 2020-07-26 DIAGNOSIS — Z6841 Body Mass Index (BMI) 40.0 and over, adult: Secondary | ICD-10-CM

## 2020-07-26 DIAGNOSIS — R632 Polyphagia: Secondary | ICD-10-CM | POA: Diagnosis not present

## 2020-07-26 DIAGNOSIS — E8881 Metabolic syndrome: Secondary | ICD-10-CM

## 2020-07-26 MED ORDER — PHENTERMINE HCL 37.5 MG PO TABS: 18.2500 mg | ORAL_TABLET | Freq: Every day | ORAL | 0 refills | Status: DC

## 2020-07-26 MED ORDER — OZEMPIC (0.25 OR 0.5 MG/DOSE) 2 MG/1.5ML ~~LOC~~ SOPN: 0.2500 mg | PEN_INJECTOR | SUBCUTANEOUS | 0 refills | Status: DC

## 2020-07-26 MED ORDER — BUPROPION HCL ER (SR) 150 MG PO TB12: 150.0000 mg | ORAL_TABLET | Freq: Every day | ORAL | 0 refills | Status: DC

## 2020-07-27 NOTE — Progress Notes (Signed)
Chief Complaint:   OBESITY Jordan Bush is here to discuss her progress with her obesity treatment plan along with follow-up of her obesity related diagnoses. Jordan Bush is on the Category 3 Plan and states she is following her eating plan approximately 80-85% of the time. Jordan Bush states she is walking for 30 minutes 3 times per week and doing Crossfit for 25 minutes 1 time per week.  Today's visit was #: 15 Starting weight: 266 lbs Starting date: 09/22/2019 Today's weight: 239 lbs Today's date: 07/26/2020 Total lbs lost to date: 27 lbs Total lbs lost since last in-office visit: 1 lb Total weight loss percentage to date: -10.15%  Interim History: Jordan Bush says that Kirke Shaggy is not covered by her insurance.  She is generally sleeping more.  She report this is helping to decrease the amount of sweets she is eating.  Assessment/Plan:   1. Metabolic syndrome Goal: Lose 7-10% of starting weight. Counseling: Intensive lifestyle modifications are the first line treatment for this issue. We discussed several lifestyle modifications today and she will continue to work on diet, exercise and weight loss efforts.   -Start Semaglutide,0.25 or 0.5MG /DOS, (OZEMPIC, 0.25 OR 0.5 MG/DOSE,) 2 MG/1.5ML SOPN; Inject 0.25 mg into the skin once a week.  Dispense: 1.5 mL; Refill: 0  2. Polyphagia Intensive lifestyle modifications are the first line treatment for this issue. We discussed several lifestyle modifications today and she will continue to work on diet, exercise and weight loss efforts.   -Refill buPROPion (WELLBUTRIN SR) 150 MG 12 hr tablet; Take 1 tablet (150 mg total) by mouth daily.  Dispense: 30 tablet; Refill: 0  3. At risk for heart disease Jordan Bush is at a higher than average risk for cardiovascular disease due to obesity and metabolic syndrome.   4. Class 3 severe obesity with serious comorbidity and body mass index (BMI) of 40.0 to 44.9 in adult, unspecified obesity type (Jordan Bush) This patient 1)  has no evidence of serious cardiovascular disease; 2) does not have serious psychiatric disease or a history of substance abuse; 3) has been informed about weight loss medications that are FDA-approved for long term use, 4) does not demonstrate a clinically significant increase in pulse or BP when taking phentermine; and 5) demonstrates significant weight loss while using the medication. Patient understands that all anti-obesity medications are contraindicated in pregnancy. Jordan Bush denies a history of glaucoma. Patient understands that long-term use of phentermine is considered off-label use of this medication, however, that the Endocrine Society and recent research supports that long-term use of phentermine does not appear to have detrimental health effects when used in the appropriate patient. In addition, a 2019 study published in Obesity Journal on 13,972 patients concluded that "recommendations to limit phentermine to less than 3 months do not align with current concepts of pharmacologic treatment of obesity", and that "long term phentermine users experience greater weight loss without apparent increases in cardiovascular risk".  We reviewed potential side effects including insomnia, dry mouth, increased heart rate and blood pressure, increased anxiety. We reviewed reducing caffeine consumption while taking phentermine, especially if the patient is experiencing side effects. All questions were answered, and the patient wishes to move forward with this medication.  I have consulted the [place] Controlled Substances Registry for this patient, and feel the risk/benefit ratio today is favorable for proceeding with this prescription for a controlled substance. The patient understands monitoring parameters and red flags.   - Refill phentermine (ADIPEX-P) 37.5 MG tablet; Take 0.5 tablets (18.75 mg total)  by mouth daily before breakfast.  Dispense: 15 tablet; Refill: 0  We discussed Contrave and Qsymia going forward. She  will read about each and we will discuss at her next visit.  Jordan Bush is currently in the action stage of change. As such, her goal is to continue with weight loss efforts. She has agreed to the Category 3 Plan.   Exercise goals: For substantial health benefits, adults should do at least 150 minutes (2 hours and 30 minutes) a week of moderate-intensity, or 75 minutes (1 hour and 15 minutes) a week of vigorous-intensity aerobic physical activity, or an equivalent combination of moderate- and vigorous-intensity aerobic activity. Aerobic activity should be performed in episodes of at least 10 minutes, and preferably, it should be spread throughout the week.  Behavioral modification strategies: increasing lean protein intake and keeping healthy foods in the home.  Jordan Bush has agreed to follow-up with our clinic in 3 weeks. She was informed of the importance of frequent follow-up visits to maximize her success with intensive lifestyle modifications for her multiple health conditions.   Objective:   Blood pressure 106/74, pulse 78, temperature 98.6 F (37 C), temperature source Oral, height 5\' 3"  (1.6 m), weight 239 lb (108.4 kg), SpO2 98 %. Body mass index is 42.34 kg/m.  General: Cooperative, alert, well developed, in no acute distress. HEENT: Conjunctivae and lids unremarkable. Cardiovascular: Regular rhythm.  Lungs: Normal work of breathing. Neurologic: No focal deficits.   Lab Results  Component Value Date   CREATININE 1.08 (H) 03/10/2020   BUN 14 03/10/2020   NA 140 03/10/2020   K 4.3 03/10/2020   CL 105 03/10/2020   CO2 23 03/10/2020   Lab Results  Component Value Date   ALT 19 03/10/2020   AST 10 03/10/2020   ALKPHOS 71 03/10/2020   BILITOT 0.3 03/10/2020   Lab Results  Component Value Date   HGBA1C 5.2 09/22/2019   HGBA1C 5.6 06/27/2017   Lab Results  Component Value Date   INSULIN 17.0 03/10/2020   INSULIN 32.8 (H) 09/22/2019   Lab Results  Component Value Date    TSH 0.940 09/22/2019   Lab Results  Component Value Date   CHOL 169 12/02/2019   HDL 40.30 12/02/2019   LDLCALC 108 (H) 12/02/2019   TRIG 106.0 12/02/2019   CHOLHDL 4 12/02/2019   Lab Results  Component Value Date   WBC 5.9 03/10/2020   HGB 11.6 03/10/2020   HCT 35.6 03/10/2020   MCV 86 03/10/2020   PLT 235 03/10/2020   Lab Results  Component Value Date   IRON 50 09/22/2019   TIBC 273 09/22/2019   FERRITIN 226 (H) 09/22/2019   Attestation Statements:   Reviewed by clinician on day of visit: allergies, medications, problem list, medical history, surgical history, family history, social history, and previous encounter notes.  I, Water quality scientist, CMA, am acting as transcriptionist for Briscoe Deutscher, DO  I have reviewed the above documentation for accuracy and completeness, and I agree with the above. Briscoe Deutscher, DO

## 2020-08-02 NOTE — Telephone Encounter (Signed)
Ozempic denied due to patient not having the DX of DM type 2

## 2020-08-16 ENCOUNTER — Other Ambulatory Visit: Payer: Self-pay

## 2020-08-16 ENCOUNTER — Encounter (INDEPENDENT_AMBULATORY_CARE_PROVIDER_SITE_OTHER): Payer: Self-pay | Admitting: Family Medicine

## 2020-08-16 ENCOUNTER — Ambulatory Visit (INDEPENDENT_AMBULATORY_CARE_PROVIDER_SITE_OTHER): Payer: BC Managed Care – PPO | Admitting: Family Medicine

## 2020-08-16 VITALS — BP 117/83 | HR 78 | Temp 98.5°F | Ht 63.0 in | Wt 238.0 lb

## 2020-08-16 DIAGNOSIS — E559 Vitamin D deficiency, unspecified: Secondary | ICD-10-CM

## 2020-08-16 DIAGNOSIS — Z6841 Body Mass Index (BMI) 40.0 and over, adult: Secondary | ICD-10-CM

## 2020-08-16 DIAGNOSIS — I10 Essential (primary) hypertension: Secondary | ICD-10-CM | POA: Diagnosis not present

## 2020-08-16 DIAGNOSIS — E66813 Obesity, class 3: Secondary | ICD-10-CM

## 2020-08-16 DIAGNOSIS — Z9189 Other specified personal risk factors, not elsewhere classified: Secondary | ICD-10-CM

## 2020-08-16 DIAGNOSIS — R5383 Other fatigue: Secondary | ICD-10-CM

## 2020-08-16 DIAGNOSIS — E538 Deficiency of other specified B group vitamins: Secondary | ICD-10-CM | POA: Diagnosis not present

## 2020-08-16 DIAGNOSIS — E8881 Metabolic syndrome: Secondary | ICD-10-CM

## 2020-08-16 DIAGNOSIS — E88819 Insulin resistance, unspecified: Secondary | ICD-10-CM

## 2020-08-16 MED ORDER — PHENTERMINE HCL 37.5 MG PO TABS: 18.2500 mg | ORAL_TABLET | Freq: Every day | ORAL | 0 refills | Status: DC

## 2020-08-16 MED ORDER — METFORMIN HCL ER 500 MG PO TB24: 500.0000 mg | ORAL_TABLET | Freq: Every day | ORAL | 0 refills | Status: DC

## 2020-08-17 ENCOUNTER — Encounter (INDEPENDENT_AMBULATORY_CARE_PROVIDER_SITE_OTHER): Payer: Self-pay | Admitting: Family Medicine

## 2020-08-17 LAB — HEMOGLOBIN A1C
Est. average glucose Bld gHb Est-mCnc: 111 mg/dL
Hgb A1c MFr Bld: 5.5 % (ref 4.8–5.6)

## 2020-08-17 LAB — CBC WITH DIFFERENTIAL/PLATELET
Basophils Absolute: 0 10*3/uL (ref 0.0–0.2)
Basos: 1 %
EOS (ABSOLUTE): 0.2 10*3/uL (ref 0.0–0.4)
Eos: 3 %
Hemoglobin: 12.4 g/dL (ref 11.1–15.9)
Immature Grans (Abs): 0 10*3/uL (ref 0.0–0.1)
Immature Granulocytes: 0 %
Lymphocytes Absolute: 2.4 10*3/uL (ref 0.7–3.1)
Lymphs: 39 %
MCH: 28.2 pg (ref 26.6–33.0)
MCHC: 32.4 g/dL (ref 31.5–35.7)
MCV: 87 fL (ref 79–97)
Monocytes Absolute: 0.4 10*3/uL (ref 0.1–0.9)
Monocytes: 6 %
Neutrophils Absolute: 3 10*3/uL (ref 1.4–7.0)
Neutrophils: 51 %
Platelets: 258 10*3/uL (ref 150–450)
RBC: 4.4 x10E6/uL (ref 3.77–5.28)
RDW: 13.5 % (ref 11.7–15.4)
WBC: 6 10*3/uL (ref 3.4–10.8)

## 2020-08-17 LAB — ANEMIA PANEL
Ferritin: 274 ng/mL — ABNORMAL HIGH (ref 15–150)
Folate, Hemolysate: 589 ng/mL
Folate, RBC: 1538 ng/mL (ref >498)
Hematocrit: 38.3 % (ref 34.0–46.6)
Iron Saturation: 29 % (ref 15–55)
Iron: 73 ug/dL (ref 27–159)
Retic Ct Pct: 2.2 % (ref 0.6–2.6)
Total Iron Binding Capacity: 255 ug/dL (ref 250–450)
UIBC: 182 ug/dL (ref 131–425)
Vitamin B-12: 1752 pg/mL — ABNORMAL HIGH (ref 232–1245)

## 2020-08-17 LAB — COMPREHENSIVE METABOLIC PANEL
ALT: 19 IU/L (ref 0–32)
AST: 14 IU/L (ref 0–40)
Albumin/Globulin Ratio: 1.5 (ref 1.2–2.2)
Albumin: 4.3 g/dL (ref 3.8–4.8)
Alkaline Phosphatase: 67 IU/L (ref 44–121)
BUN/Creatinine Ratio: 17 (ref 9–23)
BUN: 17 mg/dL (ref 6–24)
Bilirubin Total: 0.3 mg/dL (ref 0.0–1.2)
CO2: 23 mmol/L (ref 20–29)
Calcium: 9.3 mg/dL (ref 8.7–10.2)
Chloride: 100 mmol/L (ref 96–106)
Creatinine, Ser: 1 mg/dL (ref 0.57–1.00)
GFR calc Af Amer: 77 mL/min/{1.73_m2} (ref >59)
GFR calc non Af Amer: 67 mL/min/{1.73_m2} (ref >59)
Globulin, Total: 2.9 g/dL (ref 1.5–4.5)
Glucose: 96 mg/dL (ref 65–99)
Potassium: 4.6 mmol/L (ref 3.5–5.2)
Sodium: 137 mmol/L (ref 134–144)
Total Protein: 7.2 g/dL (ref 6.0–8.5)

## 2020-08-17 LAB — LIPID PANEL
Chol/HDL Ratio: 4.4 ratio (ref 0.0–4.4)
Cholesterol, Total: 200 mg/dL — ABNORMAL HIGH (ref 100–199)
HDL: 45 mg/dL (ref >39)
LDL Chol Calc (NIH): 138 mg/dL — ABNORMAL HIGH (ref 0–99)
Triglycerides: 96 mg/dL (ref 0–149)
VLDL Cholesterol Cal: 17 mg/dL (ref 5–40)

## 2020-08-17 LAB — VITAMIN D 25 HYDROXY (VIT D DEFICIENCY, FRACTURES): Vit D, 25-Hydroxy: 70.4 ng/mL (ref 30.0–100.0)

## 2020-08-17 NOTE — Progress Notes (Signed)
Chief Complaint:   OBESITY [name] is here to discuss her progress with her obesity treatment plan along with follow-up of her obesity related diagnoses. Jordan Bush is on the Category 3 Plan and states she is following her eating plan approximately 85% of the time. Jordan Bush states she is exercising for 0 minutes 0 times per week.  Today's visit was #: 71 Starting weight: 266 lbs Starting date: 09/22/2019 Today's weight: 238 lbs Today's date: 08/16/2020 Total lbs lost to date: 28 lbs Total lbs lost since last in-office visit: 1 lb Total weight loss percentage to date: -10.53%  Interim History: Jordan Bush is down 18 pounds today.  She says she went home to [facility] for 1 week.  She got back to the plan after coming home.  Plan:  Refill phentermine today.  Assessment/Plan:   1. Insulin resistance, with polyphagia Not optimized. Goal is HgbA1c < 5.7 and insulin level closer to 5. She will continue to focus on protein-rich, low simple carbohydrate foods. We reviewed the importance of hydration, regular exercise for stress reduction, and restorative sleep. For the polyphagia, we will start metformin 500 mg daily, as per below. Risk versus benefits of medication reviewed. The patient understands monitoring parameters and red flags.   Lab Results  Component Value Date   INSULIN 17.0 03/10/2020   INSULIN 32.8 (H) 09/22/2019   Lab Results  Component Value Date   HGBA1C 5.5 08/16/2020   - Comprehensive metabolic panel - Hemoglobin A1c - Insulin, Free (Bioactive) - Start metFORMIN (GLUCOPHAGE-XR) 500 MG 24 hr tablet; Take 1 tablet (500 mg total) by mouth daily with breakfast.  Dispense: 30 tablet; Refill: 0  2. Metabolic syndrome Initial goal has been met with 10% total weight loss. Goal: Lose 7-10% of starting weight. We discussed several lifestyle modifications today and she will continue to work on diet, exercise and weight loss efforts.   - Lipid panel  3. Vitamin D deficiency Current  vitamin D is 63.1, tested on 03/10/2020. At goal. Optimal goal > 50 ng/dL. There is also evidence to support a goal of >70 ng/dL in patients with cancer and heart disease. Plan: Continue Vitamin D '@5' ,000 IU daily with follow-up for routine testing of Vitamin D at least 2-3 times per year to avoid over-replacement.  Will check level today.  - VITAMIN D 25 Hydroxy (Vit-D Deficiency, Fractures)  4. Essential hypertension At goal. Medications: Norvasc 5 mg daily. We will monitor for hypotension with continued weight loss.   BP Readings from Last 3 Encounters:  08/16/20 117/83  07/26/20 106/74  06/30/20 126/84   Lab Results  Component Value Date   CREATININE 1.00 08/16/2020   5. B12 deficiency  Lab Results  Component Value Date   VITAMINB12 1,752 (H) 08/16/2020   6. Other fatigue Jordan Bush has been having more fatigue than usual lately.  - CBC with Differential/Platelet - Comprehensive metabolic panel - Anemia panel  7. At risk for activity intolerance Jordan Bush was given approximately 15 minutes of exercise intolerance counseling today. She is 49 y.o. female and has risk factors exercise intolerance including obesity, increased fatigue, Hx of vitamin deficiency and anemia. We discussed intensive lifestyle modifications today with an emphasis on specific weight loss instructions and strategies. Jordan Bush will slowly increase activity as tolerated.  8. Class 3 severe obesity with serious comorbidity and body mass index (BMI) of 40.0 to 44.9 in adult, unspecified obesity type (Jordan Bush) This patient 1) has no evidence of serious cardiovascular disease; 2) does not have serious  psychiatric disease or a history of substance abuse; 3) has been informed about weight loss medications that are FDA-approved for long term use and told that these have been documented to be safe and effectivet; 4) does not demonstrate a clinically significant increase in pulse or BP when taking phentermine; and 5) demonstrates  significant weight loss while using the medication. Patient understands that all anti-obesity medications are contraindicated in pregnancy. Pt denies a history of glaucoma.   Patient understands that long-term use of phentermine is considered off-label use of this medication, however, that the Endocrine Society and recent research supports that long-term use of phentermine does not appear to have detrimental health effects when used in the appropriate patient. In addition, a 2019 study published in Obesity Journal on 13,972 patients concluded that "recommendations to limit phentermine to less than 3 months do not align with current concepts of pharmacologic treatment of obesity", and that "long term phentermine users experience greater weight loss without apparent increases in cardiovascular risk".  The potential risks and benefits of phentermine have been reviewed with the patient, and alternative treatment options were discussed. All questions were answered, and the patient wishes to move forward with this medication.  - Refill phentermine (ADIPEX-P) 37.5 MG tablet; Take 0.5 tablets (18.75 mg total) by mouth daily before breakfast.  Dispense: 15 tablet; Refill: 0  I have consulted the [place] Controlled Substances Registry for this patient, and feel the risk/benefit ratio today is favorable for proceeding with this prescription for a controlled substance. The patient understands monitoring parameters and red flags.  Jordan Bush is currently in the action stage of change. As such, her goal is to continue with weight loss efforts. She has agreed to the Category 3 Plan.   Exercise goals: For substantial health benefits, adults should do at least 150 minutes (2 hours and 30 minutes) a week of moderate-intensity, or 75 minutes (1 hour and 15 minutes) a week of vigorous-intensity aerobic physical activity, or an equivalent combination of moderate- and vigorous-intensity aerobic activity. Aerobic activity should be  performed in episodes of at least 10 minutes, and preferably, it should be spread throughout the week.  Behavioral modification strategies: increasing lean protein intake, decreasing simple carbohydrates, increasing vegetables and increasing water intake.  Jordan Bush has agreed to follow-up with our clinic in 3-4 weeks. She was informed of the importance of frequent follow-up visits to maximize her success with intensive lifestyle modifications for her multiple health conditions.   Jordan Bush was informed we would discuss her lab results at her next visit unless there is a critical issue that needs to be addressed sooner. Jordan Bush agreed to keep her next visit at the agreed upon time to discuss these results.  Objective:   Blood pressure 117/83, pulse 78, temperature 98.5 F (36.9 C), temperature source Oral, height '5\' 3"'  (1.6 m), weight 238 lb (108 kg), SpO2 98 %. Body mass index is 42.16 kg/m.  General: Cooperative, alert, well developed, in no acute distress. HEENT: Conjunctivae and lids unremarkable. Cardiovascular: Regular rhythm.  Lungs: Normal work of breathing. Neurologic: No focal deficits.   Lab Results  Component Value Date   CREATININE 1.00 08/16/2020   BUN 17 08/16/2020   NA 137 08/16/2020   K 4.6 08/16/2020   CL 100 08/16/2020   CO2 23 08/16/2020   Lab Results  Component Value Date   ALT 19 08/16/2020   AST 14 08/16/2020   ALKPHOS 67 08/16/2020   BILITOT 0.3 08/16/2020   Lab Results  Component Value Date  HGBA1C 5.5 08/16/2020   HGBA1C 5.2 09/22/2019   HGBA1C 5.6 06/27/2017   Lab Results  Component Value Date   INSULIN 17.0 03/10/2020   INSULIN 32.8 (H) 09/22/2019   Lab Results  Component Value Date   TSH 0.940 09/22/2019   Lab Results  Component Value Date   CHOL 200 (H) 08/16/2020   HDL 45 08/16/2020   LDLCALC 138 (H) 08/16/2020   TRIG 96 08/16/2020   CHOLHDL 4.4 08/16/2020   Lab Results  Component Value Date   WBC 6.0 08/16/2020   HGB 12.4  08/16/2020   HCT 38.3 08/16/2020   MCV 87 08/16/2020   PLT 258 08/16/2020   Lab Results  Component Value Date   IRON 73 08/16/2020   TIBC 255 08/16/2020   FERRITIN 274 (H) 08/16/2020   Attestation Statements:   Reviewed by clinician on day of visit: allergies, medications, problem list, medical history, surgical history, family history, social history, and previous encounter notes.  I, Water quality scientist, CMA, am acting as transcriptionist for Briscoe Deutscher, DO  I have reviewed the above documentation for accuracy and completeness, and I agree with the above. Briscoe Deutscher, DO

## 2020-08-29 ENCOUNTER — Other Ambulatory Visit: Payer: Self-pay | Admitting: Family Medicine

## 2020-08-29 ENCOUNTER — Other Ambulatory Visit (INDEPENDENT_AMBULATORY_CARE_PROVIDER_SITE_OTHER): Payer: Self-pay | Admitting: Family Medicine

## 2020-08-29 DIAGNOSIS — R632 Polyphagia: Secondary | ICD-10-CM

## 2020-08-29 DIAGNOSIS — I10 Essential (primary) hypertension: Secondary | ICD-10-CM

## 2020-08-30 ENCOUNTER — Encounter (INDEPENDENT_AMBULATORY_CARE_PROVIDER_SITE_OTHER): Payer: Self-pay

## 2020-08-30 NOTE — Telephone Encounter (Signed)
Message sent to pt.

## 2020-09-13 ENCOUNTER — Other Ambulatory Visit: Payer: Self-pay

## 2020-09-13 ENCOUNTER — Ambulatory Visit (INDEPENDENT_AMBULATORY_CARE_PROVIDER_SITE_OTHER): Payer: BC Managed Care – PPO | Admitting: Family Medicine

## 2020-09-13 ENCOUNTER — Encounter (INDEPENDENT_AMBULATORY_CARE_PROVIDER_SITE_OTHER): Payer: Self-pay | Admitting: Family Medicine

## 2020-09-13 VITALS — BP 116/78 | HR 66 | Temp 98.3°F | Ht 63.0 in | Wt 237.0 lb

## 2020-09-13 DIAGNOSIS — E7800 Pure hypercholesterolemia, unspecified: Secondary | ICD-10-CM

## 2020-09-13 DIAGNOSIS — D508 Other iron deficiency anemias: Secondary | ICD-10-CM | POA: Diagnosis not present

## 2020-09-13 DIAGNOSIS — E559 Vitamin D deficiency, unspecified: Secondary | ICD-10-CM

## 2020-09-13 DIAGNOSIS — R632 Polyphagia: Secondary | ICD-10-CM

## 2020-09-13 DIAGNOSIS — Z6841 Body Mass Index (BMI) 40.0 and over, adult: Secondary | ICD-10-CM

## 2020-09-13 DIAGNOSIS — E8881 Metabolic syndrome: Secondary | ICD-10-CM | POA: Diagnosis not present

## 2020-09-13 DIAGNOSIS — Z9189 Other specified personal risk factors, not elsewhere classified: Secondary | ICD-10-CM

## 2020-09-13 MED ORDER — METFORMIN HCL ER 500 MG PO TB24: 500.0000 mg | ORAL_TABLET | Freq: Every day | ORAL | 0 refills | Status: DC

## 2020-09-13 MED ORDER — PHENTERMINE HCL 37.5 MG PO TABS: 18.2500 mg | ORAL_TABLET | Freq: Every day | ORAL | 0 refills | Status: DC

## 2020-09-13 MED ORDER — BUPROPION HCL ER (SR) 150 MG PO TB12: ORAL_TABLET | ORAL | 0 refills | Status: DC

## 2020-09-14 NOTE — Progress Notes (Signed)
Chief Complaint:   OBESITY Jordan Bush is here to discuss her progress with her obesity treatment plan along with follow-up of her obesity related diagnoses.   Today's visit was #: 17 Starting weight: 266 lbs Starting date: 09/22/2019 Today's weight: 237 lbs Today's date: 09/13/2020 Total lbs lost to date: 29 lbs Body mass index is 41.98 kg/m.  Total weight loss percentage to date: -10.90%  Nutrition Plan: the Category 3 Plan for 90% of the time Anti-obesity medications: Phentermine 18.75 mg daily. Reported side effects: None. Hunger is well controlled controlled. Cravings are well controlled controlled.  Activity: Walking fro 30 minutes 3 times per day. Sleep: Sleep is restful.  Stress: Increased stress at work.  Assessment/Plan:   1. Vitamin D deficiency Discussed labs with patient today.  Current vitamin D is 70.4, tested on 08/16/2020. At goal. Optimal goal > 50 ng/dL.   Plan:  []   Continue Vitamin D @50 ,000 IU every week. [x]   Continue home supplement daily, but decrease to 2,000 IU daily. [x]   Follow-up for routine testing of Vitamin D at least 2-3 times per year to avoid over-replacement. []   Monitored by PCP.  2. Metabolic syndrome Discussed labs with patient today.  Starting goal: Lose 7-10% of starting weight. She will continue to focus on protein-rich, low simple carbohydrate foods. We reviewed the importance of hydration, regular exercise for stress reduction, and restorative sleep.  We will continue to check lab work every 3 months, with 10% weight loss, or should any other concerns arise.  3. Pure hypercholesterolemia Discussed labs with patient today.  LDL is not at goal.  She is not taking a statin at this time.  Lab Results  Component Value Date   ALT 19 08/16/2020   AST 14 08/16/2020   ALKPHOS 67 08/16/2020   BILITOT 0.3 08/16/2020   Lab Results  Component Value Date   CHOL 200 (H) 08/16/2020   HDL 45 08/16/2020   LDLCALC 138 (H) 08/16/2020   TRIG  96 08/16/2020   CHOLHDL 4.4 08/16/2020   4. Other iron deficiency anemia Discussed labs with patient today.  Resolved.  Jordan Bush is taking an over the counter iron supplement.   5. Polyphagia Hyperphagia, also called polyphagia, refers to excessive feelings of hunger. This is more likely to be an issues for people that have diabetes, prediabetes, or insulin resistance. She will continue to focus on protein-rich, low simple carbohydrate foods. We reviewed the importance of hydration, regular exercise for stress reduction, and restorative sleep.  - Refill buPROPion (WELLBUTRIN SR) 150 MG 12 hr tablet; TAKE 1 TABLET(150 MG) BY MOUTH DAILY  Dispense: 90 tablet; Refill: 0 - Refill metFORMIN (GLUCOPHAGE-XR) 500 MG 24 hr tablet; Take 1 tablet (500 mg total) by mouth daily with breakfast.  Dispense: 90 tablet; Refill: 0 - Refill phentermine (ADIPEX-P) 37.5 MG tablet; Take 0.5 tablets (18.75 mg total) by mouth daily before breakfast.  Dispense: 15 tablet; Refill: 0  This patient 1) has no evidence of serious cardiovascular disease; 2) does not have serious psychiatric disease or a history of substance abuse; 3) has been informed about weight loss medications that are FDA-approved for long term use and told that these have been documented to be safe and effective; 4) does not demonstrate a clinically significant increase in pulse or BP when taking phentermine; and 5) demonstrates significant weight loss while using the medication. Patient understands that all anti-obesity medications are contraindicated in pregnancy. Pt denies a history of glaucoma.   Patient understands  that long-term use of phentermine is considered off-label use of this medication, however, that the Endocrine Society and recent research supports that long-term use of phentermine does not appear to have detrimental health effects when used in the appropriate patient. In addition, a 2019 study published in Obesity Journal on 13,972 patients  concluded that "recommendations to limit phentermine to less than 3 months do not align with current concepts of pharmacologic treatment of obesity", and that "long term phentermine users experience greater weight loss without apparent increases in cardiovascular risk".  The potential risks and benefits of phentermine have been reviewed with the patient, and alternative treatment options were discussed. All questions were answered, and the patient wishes to move forward with this medication.  I have consulted the [place] Controlled Substances Registry for this patient, and feel the risk/benefit ratio today is favorable for proceeding with this prescription for a controlled substance. The patient understands monitoring parameters and red flags.   6. At risk for heart disease Jordan Bush was given approximately 15 minutes of coronary artery disease prevention counseling today. She is 49 y.o. female and has risk factors for heart disease including obesity and metabolic syndrome. We discussed intensive lifestyle modifications today with an emphasis on specific weight loss instructions and strategies. Repetitive spaced learning was employed today to elicit superior memory formation and behavioral change.  7. Class 3 severe obesity with serious comorbidity and body mass index (BMI) of 40.0 to 44.9 in adult, unspecified obesity type (HCC)  Jordan Bush is currently in the action stage of change. As such, her goal is to continue with weight loss efforts.   Nutrition goals: She has agreed to the Category 3 Plan.   Exercise goals: For substantial health benefits, adults should do at least 150 minutes (2 hours and 30 minutes) a week of moderate-intensity, or 75 minutes (1 hour and 15 minutes) a week of vigorous-intensity aerobic physical activity, or an equivalent combination of moderate- and vigorous-intensity aerobic activity. Aerobic activity should be performed in episodes of at least 10 minutes, and preferably, it should  be spread throughout the week.  Behavioral modification strategies: increasing lean protein intake, decreasing simple carbohydrates, increasing vegetables and increasing water intake.  Jordan Bush has agreed to follow-up with our clinic in 4 weeks. She was informed of the importance of frequent follow-up visits to maximize her success with intensive lifestyle modifications for her multiple health conditions.   Objective:   Blood pressure 116/78, pulse 66, temperature 98.3 F (36.8 C), temperature source Oral, height 5\' 3"  (1.6 m), weight 237 lb (107.5 kg), SpO2 98 %. Body mass index is 41.98 kg/m.  General: Cooperative, alert, well developed, in no acute distress. HEENT: Conjunctivae and lids unremarkable. Cardiovascular: Regular rhythm.  Lungs: Normal work of breathing. Neurologic: No focal deficits.   Lab Results  Component Value Date   CREATININE 1.00 08/16/2020   BUN 17 08/16/2020   NA 137 08/16/2020   K 4.6 08/16/2020   CL 100 08/16/2020   CO2 23 08/16/2020   Lab Results  Component Value Date   ALT 19 08/16/2020   AST 14 08/16/2020   ALKPHOS 67 08/16/2020   BILITOT 0.3 08/16/2020   Lab Results  Component Value Date   HGBA1C 5.5 08/16/2020   HGBA1C 5.2 09/22/2019   HGBA1C 5.6 06/27/2017   Lab Results  Component Value Date   INSULIN 17.0 03/10/2020   INSULIN 32.8 (H) 09/22/2019   Lab Results  Component Value Date   TSH 0.940 09/22/2019   Lab Results  Component Value Date   CHOL 200 (H) 08/16/2020   HDL 45 08/16/2020   LDLCALC 138 (H) 08/16/2020   TRIG 96 08/16/2020   CHOLHDL 4.4 08/16/2020   Lab Results  Component Value Date   WBC 6.0 08/16/2020   HGB 12.4 08/16/2020   HCT 38.3 08/16/2020   MCV 87 08/16/2020   PLT 258 08/16/2020   Lab Results  Component Value Date   IRON 73 08/16/2020   TIBC 255 08/16/2020   FERRITIN 274 (H) 08/16/2020   Attestation Statements:   Reviewed by clinician on day of visit: allergies, medications, problem list,  medical history, surgical history, family history, social history, and previous encounter notes.  I, Water quality scientist, CMA, am acting as transcriptionist for Briscoe Deutscher, DO  I have reviewed the above documentation for accuracy and completeness, and I agree with the above. Briscoe Deutscher, DO

## 2020-09-22 ENCOUNTER — Encounter (INDEPENDENT_AMBULATORY_CARE_PROVIDER_SITE_OTHER): Payer: Self-pay

## 2020-09-27 DIAGNOSIS — L089 Local infection of the skin and subcutaneous tissue, unspecified: Secondary | ICD-10-CM | POA: Diagnosis not present

## 2020-09-27 DIAGNOSIS — L910 Hypertrophic scar: Secondary | ICD-10-CM | POA: Diagnosis not present

## 2020-09-27 DIAGNOSIS — L219 Seborrheic dermatitis, unspecified: Secondary | ICD-10-CM | POA: Diagnosis not present

## 2020-10-13 ENCOUNTER — Ambulatory Visit (INDEPENDENT_AMBULATORY_CARE_PROVIDER_SITE_OTHER): Payer: BC Managed Care – PPO | Admitting: Family Medicine

## 2020-10-19 ENCOUNTER — Ambulatory Visit (INDEPENDENT_AMBULATORY_CARE_PROVIDER_SITE_OTHER): Payer: BC Managed Care – PPO | Admitting: Family Medicine

## 2020-10-19 ENCOUNTER — Encounter (INDEPENDENT_AMBULATORY_CARE_PROVIDER_SITE_OTHER): Payer: Self-pay | Admitting: Family Medicine

## 2020-10-19 ENCOUNTER — Other Ambulatory Visit: Payer: Self-pay

## 2020-10-19 VITALS — BP 133/91 | HR 85 | Temp 98.2°F | Ht 63.0 in | Wt 240.0 lb

## 2020-10-19 DIAGNOSIS — E8881 Metabolic syndrome: Secondary | ICD-10-CM | POA: Diagnosis not present

## 2020-10-19 DIAGNOSIS — Z9189 Other specified personal risk factors, not elsewhere classified: Secondary | ICD-10-CM | POA: Diagnosis not present

## 2020-10-19 DIAGNOSIS — F3289 Other specified depressive episodes: Secondary | ICD-10-CM

## 2020-10-19 DIAGNOSIS — R632 Polyphagia: Secondary | ICD-10-CM | POA: Diagnosis not present

## 2020-10-19 DIAGNOSIS — Z6841 Body Mass Index (BMI) 40.0 and over, adult: Secondary | ICD-10-CM

## 2020-10-19 MED ORDER — TOPIRAMATE 50 MG PO TABS: 50.0000 mg | ORAL_TABLET | Freq: Two times a day (BID) | ORAL | 0 refills | Status: DC

## 2020-10-19 MED ORDER — PHENTERMINE HCL 37.5 MG PO TABS: 18.2500 mg | ORAL_TABLET | Freq: Every day | ORAL | 0 refills | Status: DC

## 2020-10-19 MED ORDER — METFORMIN HCL ER 500 MG PO TB24: 500.0000 mg | ORAL_TABLET | Freq: Every day | ORAL | 0 refills | Status: DC

## 2020-10-19 MED ORDER — BUPROPION HCL ER (SR) 150 MG PO TB12: ORAL_TABLET | ORAL | 0 refills | Status: DC

## 2020-10-20 NOTE — Progress Notes (Signed)
Chief Complaint:   OBESITY Jordan Bush is here to discuss her progress with her obesity treatment plan along with follow-up of her obesity related diagnoses.   Today's visit was #: 18 Starting weight: 266 lbs Starting date: 09/22/2019 Today's weight: 240 lbs Today's date: 10/19/2020 Total lbs lost to date: 26 lbs Body mass index is 42.51 kg/m.  Total weight loss percentage to date: -9.77%  Interim History: Jordan Bush has just gotten back from a 10-day trip to [place].  Prior to that, she was at the beach for 3 days.  She says she is now back on plan. She has no insurance coverage for AOM. Nutrition Plan: the Category 3 Plan for 50% of the time.  Anti-obesity medications: metformin 500 mg XR daily and phentermine 18.75 mg daily.  Activity: None at this time.  Assessment/Plan:   1. Polyphagia Improved. She will continue to focus on protein-rich, low simple carbohydrate foods. We reviewed the importance of hydration, regular exercise for stress reduction, and restorative sleep.  - Refill phentermine (ADIPEX-P) 37.5 MG tablet; Take 0.5 tablets (18.75 mg total) by mouth daily before breakfast.  Dispense: 15 tablet; Refill: 0 - Start topiramate (TOPAMAX) 50 MG tablet; Take 1 tablet (50 mg total) by mouth 2 (two) times daily.  Dispense: 60 tablet; Refill: 0  This patient 1) has no evidence of serious cardiovascular disease; 2) does not have serious psychiatric disease or a history of substance abuse; 3) has been informed about weight loss medications that are FDA-approved for long term use and told that these have been documented to be safe and effective; 4) does not demonstrate a clinically significant increase in pulse or BP when taking phentermine; and 5) demonstrates significant weight loss while using the medication. Patient understands that all anti-obesity medications are contraindicated in pregnancy. Pt denies a history of glaucoma.   Patient understands that long-term use of  phentermine is considered off-label use of this medication, however, that the Endocrine Society and recent research supports that long-term use of phentermine does not appear to have detrimental health effects when used in the appropriate patient. In addition, a 2019 study published in Obesity Journal on [id number] patients concluded that "recommendations to limit phentermine to less than 3 months do not align with current concepts of pharmacologic treatment of obesity", and that "long term phentermine users experience greater weight loss without apparent increases in cardiovascular risk".  The potential risks and benefits of phentermine have been reviewed with the patient, and alternative treatment options were discussed. All questions were answered, and the patient wishes to move forward with this medication.  I have consulted the [place] Controlled Substances Registry for this patient, and feel the risk/benefit ratio today is favorable for proceeding with this prescription for a controlled substance. The patient understands monitoring parameters and red flags.   2. Insulin resistance At goal. Goal is HgbA1c < 5.7, fasting insulin closer to 5.  Medication: metformin 500 mg XR daily.  She will continue to focus on protein-rich, low simple carbohydrate foods. We reviewed the importance of hydration, regular exercise for stress reduction, and restorative sleep.   Lab Results  Component Value Date   HGBA1C 5.5 08/16/2020   Lab Results  Component Value Date   INSULIN 17.0 03/10/2020   INSULIN 32.8 (H) 09/22/2019   - Refill metFORMIN (GLUCOPHAGE-XR) 500 MG 24 hr tablet; Take 1 tablet (500 mg total) by mouth daily with breakfast.  Dispense: 90 tablet; Refill: 0  3. Other depression, with emotional eating Behavior  modification techniques were discussed today to help Jordan Bush deal with her emotional/non-hunger eating behaviors.    - Refill buPROPion (WELLBUTRIN SR) 150 MG 12 hr tablet; TAKE 1 TABLET(150 MG) BY  MOUTH DAILY  Dispense: 90 tablet; Refill: 0  4. At risk for side effect of medication Jordan Bush is at risk for side effects of Topamax.  She was given approximately 15 minutes of drug side effect counseling today.  We discussed side effect possibility and risk versus benefits. Jordan Bush agreed to the medication and will contact this office if these side effects are intolerable.  Patient understands that using topiramate for obesity management is considered off-label use of this medication. Patient has been informed that topiramate may cause suicidal thoughts or actions, and that the patient must notify the provider immediately if they have changes in their mood or behaviors. Patient has been informed of the increased risk of kidney stones with topiramate. Patient has been educated on the potential side effects, including drowsiness, paresthesias (tingling, numbness), cognitive effects on memory. For female patients with childbearing potential: Patient understands that topiramate is contraindicated in pregnancy, and that topiramate has the potential to cause birth defects.   5. Class 3 severe obesity with serious comorbidity and body mass index (BMI) of 40.0 to 44.9 in adult, unspecified obesity type (Jordan Bush)  Course: Jordan Bush is currently in the action stage of change. As such, her goal is to continue with weight loss efforts.   Nutrition goals: She has agreed to the Category 3 Plan.   Exercise goals: For substantial health benefits, adults should do at least 150 minutes (2 hours and 30 minutes) a week of moderate-intensity, or 75 minutes (1 hour and 15 minutes) a week of vigorous-intensity aerobic physical activity, or an equivalent combination of moderate- and vigorous-intensity aerobic activity. Aerobic activity should be performed in episodes of at least 10 minutes, and preferably, it should be spread throughout the week.  Behavioral modification strategies: increasing lean protein intake, decreasing  simple carbohydrates and increasing vegetables.  Jordan Bush has agreed to follow-up with our clinic in 4 weeks. She was informed of the importance of frequent follow-up visits to maximize her success with intensive lifestyle modifications for her multiple health conditions.   Objective:   Blood pressure (!) 133/91, pulse 85, temperature 98.2 F (36.8 C), temperature source Oral, height 5\' 3"  (1.6 m), weight 240 lb (108.9 kg), SpO2 99 %. Body mass index is 42.51 kg/m.  General: Cooperative, alert, well developed, in no acute distress. HEENT: Conjunctivae and lids unremarkable. Cardiovascular: Regular rhythm.  Lungs: Normal work of breathing. Neurologic: No focal deficits.   Lab Results  Component Value Date   CREATININE 1.00 08/16/2020   BUN 17 08/16/2020   NA 137 08/16/2020   K 4.6 08/16/2020   CL 100 08/16/2020   CO2 23 08/16/2020   Lab Results  Component Value Date   ALT 19 08/16/2020   AST 14 08/16/2020   ALKPHOS 67 08/16/2020   BILITOT 0.3 08/16/2020   Lab Results  Component Value Date   HGBA1C 5.5 08/16/2020   HGBA1C 5.2 09/22/2019   HGBA1C 5.6 06/27/2017   Lab Results  Component Value Date   INSULIN 17.0 03/10/2020   INSULIN 32.8 (H) 09/22/2019   Lab Results  Component Value Date   TSH 0.940 09/22/2019   Lab Results  Component Value Date   CHOL 200 (H) 08/16/2020   HDL 45 08/16/2020   LDLCALC 138 (H) 08/16/2020   TRIG 96 08/16/2020   CHOLHDL 4.4 08/16/2020  Lab Results  Component Value Date   WBC 6.0 08/16/2020   HGB 12.4 08/16/2020   HCT 38.3 08/16/2020   MCV 87 08/16/2020   PLT 258 08/16/2020   Lab Results  Component Value Date   IRON 73 08/16/2020   TIBC 255 08/16/2020   FERRITIN 274 (H) 08/16/2020   Attestation Statements:   Reviewed by clinician on day of visit: allergies, medications, problem list, medical history, surgical history, family history, social history, and previous encounter notes.  I, Water quality scientist, CMA, am acting as  transcriptionist for Briscoe Deutscher, DO  I have reviewed the above documentation for accuracy and completeness, and I agree with the above. Briscoe Deutscher, DO

## 2020-11-22 ENCOUNTER — Encounter (INDEPENDENT_AMBULATORY_CARE_PROVIDER_SITE_OTHER): Payer: Self-pay | Admitting: Family Medicine

## 2020-11-22 ENCOUNTER — Ambulatory Visit (INDEPENDENT_AMBULATORY_CARE_PROVIDER_SITE_OTHER): Payer: BC Managed Care – PPO | Admitting: Family Medicine

## 2020-11-22 ENCOUNTER — Other Ambulatory Visit: Payer: Self-pay

## 2020-11-22 VITALS — BP 130/89 | HR 92 | Temp 98.4°F | Ht 63.0 in | Wt 237.0 lb

## 2020-11-22 DIAGNOSIS — E8881 Metabolic syndrome: Secondary | ICD-10-CM

## 2020-11-22 DIAGNOSIS — Z9189 Other specified personal risk factors, not elsewhere classified: Secondary | ICD-10-CM

## 2020-11-22 DIAGNOSIS — R632 Polyphagia: Secondary | ICD-10-CM

## 2020-11-22 DIAGNOSIS — F3289 Other specified depressive episodes: Secondary | ICD-10-CM | POA: Diagnosis not present

## 2020-11-22 DIAGNOSIS — Z6841 Body Mass Index (BMI) 40.0 and over, adult: Secondary | ICD-10-CM

## 2020-11-22 MED ORDER — PHENTERMINE HCL 37.5 MG PO TABS: 18.2500 mg | ORAL_TABLET | Freq: Every day | ORAL | 0 refills | Status: DC

## 2020-11-24 NOTE — Progress Notes (Signed)
Chief Complaint:   OBESITY Jordan Bush is here to discuss her progress with her obesity treatment plan along with follow-up of her obesity related diagnoses.   Today's visit was #: 9 Starting weight: 266 lbs Starting date: 09/22/2019 Today's weight: 237 lbs Today's date: 11/22/2020 Total lbs lost to date: 29 lbs Body mass index is 41.98 kg/m.  Total weight loss percentage to date: -10.90%  Interim History: [name] says she is having relationship concerns.  She is ready to get back to a normal schedule. Nutrition Plan: the Category 3 Plan for 75% of the time.  Anti-obesity medications: metformin 500 mg XR daily and phentermine 18.75 mg daily. Activity: Walking for 30 minutes 2 times per week.  Assessment/Plan:   1. Polyphagia Hyperphagia, also called polyphagia, refers to excessive feelings of hunger. This is more likely to be an issues for people that have diabetes, prediabetes, or insulin resistance. She will continue to focus on protein-rich, low simple carbohydrate foods. We reviewed the importance of hydration, regular exercise for stress reduction, and restorative sleep.  - Refill phentermine (ADIPEX-P) 37.5 MG tablet; Take 0.5 tablets (18.75 mg total) by mouth daily before breakfast.  Dispense: 15 tablet; Refill: 0  Having again reminded the patient of the "off label" use of Phentermine beyond three consecutive months, and again discussing the risks, benefits, contraindications, and limitations of it's use; given it's role in the successful treatment of obesity thus far and lack of adverse effect, patient has expressed desire and given informed verbal consent to continue use.    I have consulted the [place] Controlled Substances Registry for this patient, and feel the risk/benefit ratio today is favorable for proceeding with this prescription for a controlled substance. The patient understands monitoring parameters and red flags.   2. Insulin resistance Improving, but not  optimized. Goal is HgbA1c < 5.7, fasting insulin closer to 5.  Medication: metformin 500 mg daily.  She will continue to focus on protein-rich, low simple carbohydrate foods. We reviewed the importance of hydration, regular exercise for stress reduction, and restorative sleep.   Lab Results  Component Value Date   HGBA1C 5.5 08/16/2020   Lab Results  Component Value Date   INSULIN 17.0 03/10/2020   INSULIN 32.8 (H) 09/22/2019   3. Other depression, with emotional eating Behavior modification techniques were discussed today to help [name] deal with her emotional/non-hunger eating behaviors.  Orders and follow up as documented in patient record.    Plan:  Continue topiramate 50 mg twice daily and Wellbutrin 150 mg daily.  4. At risk for anxiety [name] was given approximately 9 minutes of anxiety risk counseling today. She has risk factors for anxiety. We discussed the importance of a healthy work life balance, a healthy relationship with food and a good support system.  5. Class 3 severe obesity with serious comorbidity and body mass index (BMI) of 40.0 to 44.9 in adult, unspecified obesity type ([place])  Course: [name] is currently in the action stage of change. As such, her goal is to continue with weight loss efforts.   Nutrition goals: She has agreed to the Category 3 Plan.   Exercise goals: For substantial health benefits, adults should do at least 150 minutes (2 hours and 30 minutes) a week of moderate-intensity, or 75 minutes (1 hour and 15 minutes) a week of vigorous-intensity aerobic physical activity, or an equivalent combination of moderate- and vigorous-intensity aerobic activity. Aerobic activity should be performed in episodes of at least 10 minutes, and preferably,  it should be spread throughout the week.  Behavioral modification strategies: increasing lean protein intake, decreasing simple carbohydrates, increasing vegetables, increasing water intake and decreasing liquid  calories.  [name] has agreed to follow-up with our clinic in 3 weeks. She was informed of the importance of frequent follow-up visits to maximize her success with intensive lifestyle modifications for her multiple health conditions.   Objective:   Blood pressure 130/89, pulse 92, temperature 98.4 F (36.9 C), temperature source Oral, height 5\' 3"  (1.6 m), weight 237 lb (107.5 kg), SpO2 100 %. Body mass index is 41.98 kg/m.  General: Cooperative, alert, well developed, in no acute distress. HEENT: Conjunctivae and lids unremarkable. Cardiovascular: Regular rhythm.  Lungs: Normal work of breathing. Neurologic: No focal deficits.   Lab Results  Component Value Date   CREATININE 1.00 08/16/2020   BUN 17 08/16/2020   NA 137 08/16/2020   K 4.6 08/16/2020   CL 100 08/16/2020   CO2 23 08/16/2020   Lab Results  Component Value Date   ALT 19 08/16/2020   AST 14 08/16/2020   ALKPHOS 67 08/16/2020   BILITOT 0.3 08/16/2020   Lab Results  Component Value Date   HGBA1C 5.5 08/16/2020   HGBA1C 5.2 09/22/2019   HGBA1C 5.6 06/27/2017   Lab Results  Component Value Date   INSULIN 17.0 03/10/2020   INSULIN 32.8 (H) 09/22/2019   Lab Results  Component Value Date   TSH 0.940 09/22/2019   Lab Results  Component Value Date   CHOL 200 (H) 08/16/2020   HDL 45 08/16/2020   LDLCALC 138 (H) 08/16/2020   TRIG 96 08/16/2020   CHOLHDL 4.4 08/16/2020   Lab Results  Component Value Date   WBC 6.0 08/16/2020   HGB 12.4 08/16/2020   HCT 38.3 08/16/2020   MCV 87 08/16/2020   PLT 258 08/16/2020   Lab Results  Component Value Date   IRON 73 08/16/2020   TIBC 255 08/16/2020   FERRITIN 274 (H) 08/16/2020   Attestation Statements:   Reviewed by clinician on day of visit: allergies, medications, problem list, medical history, surgical history, family history, social history, and previous encounter notes.  I, Water quality scientist, CMA, am acting as transcriptionist for Briscoe Deutscher, DO  I  have reviewed the above documentation for accuracy and completeness, and I agree with the above. Briscoe Deutscher, DO

## 2020-12-10 ENCOUNTER — Ambulatory Visit (INDEPENDENT_AMBULATORY_CARE_PROVIDER_SITE_OTHER): Payer: BC Managed Care – PPO | Admitting: Family Medicine

## 2020-12-10 ENCOUNTER — Encounter: Payer: Self-pay | Admitting: Family Medicine

## 2020-12-10 ENCOUNTER — Other Ambulatory Visit: Payer: Self-pay

## 2020-12-10 VITALS — BP 118/68 | HR 87 | Resp 16 | Ht 63.0 in | Wt 239.0 lb

## 2020-12-10 DIAGNOSIS — Z0000 Encounter for general adult medical examination without abnormal findings: Secondary | ICD-10-CM

## 2020-12-10 DIAGNOSIS — Z2821 Immunization not carried out because of patient refusal: Secondary | ICD-10-CM

## 2020-12-10 DIAGNOSIS — Z1159 Encounter for screening for other viral diseases: Secondary | ICD-10-CM | POA: Diagnosis not present

## 2020-12-10 LAB — COMPREHENSIVE METABOLIC PANEL
ALT: 14 U/L (ref 0–35)
AST: 12 U/L (ref 0–37)
Albumin: 3.9 g/dL (ref 3.5–5.2)
Alkaline Phosphatase: 61 U/L (ref 39–117)
BUN: 19 mg/dL (ref 6–23)
CO2: 26 mEq/L (ref 19–32)
Calcium: 9.3 mg/dL (ref 8.4–10.5)
Chloride: 106 mEq/L (ref 96–112)
Creatinine, Ser: 1.12 mg/dL (ref 0.40–1.20)
GFR: 58.11 mL/min — ABNORMAL LOW (ref >60.00)
Glucose, Bld: 92 mg/dL (ref 70–99)
Potassium: 3.9 mEq/L (ref 3.5–5.1)
Sodium: 139 mEq/L (ref 135–145)
Total Bilirubin: 0.5 mg/dL (ref 0.2–1.2)
Total Protein: 6.7 g/dL (ref 6.0–8.3)

## 2020-12-10 LAB — LIPID PANEL
Cholesterol: 154 mg/dL (ref 0–200)
HDL: 41.4 mg/dL (ref >39.00)
LDL Cholesterol: 93 mg/dL (ref 0–99)
NonHDL: 112.89
Total CHOL/HDL Ratio: 4
Triglycerides: 100 mg/dL (ref 0.0–149.0)
VLDL: 20 mg/dL (ref 0.0–40.0)

## 2020-12-10 NOTE — Patient Instructions (Addendum)
Let us know if you need anything.  Give [name] 2-3 business days to get the results of your labs back.   Let me know if you change your mind about the flu shot.   Keep the diet clean and stay active.  Knee Exercises It is normal to feel mild stretching, pulling, tightness, or discomfort as you do these exercises, but you should stop right away if you feel sudden pain or your pain gets worse. STRETCHING AND RANGE OF MOTION EXERCISES  These exercises warm up your muscles and joints and improve the movement and flexibility of your knee. These exercises also help to relieve pain, numbness, and tingling. Exercise A: Knee Extension, Prone  1. Lie on your abdomen on a bed. 2. Place your left / right knee just beyond the edge of the surface so your knee is not on the bed. You can put a towel under your left / right thigh just above your knee for comfort. 3. Relax your leg muscles and allow gravity to straighten your knee. You should feel a stretch behind your left / right knee. 4. Hold this position for 30 seconds. 5. Scoot up so your knee is supported between repetitions. Repeat 2 times. Complete this stretch 3 times per week. Exercise B: Knee Flexion, Active    1. Lie on your back with both knees straight. If this causes back discomfort, bend your left / right knee so your foot is flat on the floor. 2. Slowly slide your left / right heel back toward your buttocks until you feel a gentle stretch in the front of your knee or thigh. 3. Hold this position for 30 seconds. 4. Slowly slide your left / right heel back to the starting position. Repeat 2 times. Complete this exercise 3 times per week. Exercise C: Quadriceps, Prone    1. Lie on your abdomen on a firm surface, such as a bed or padded floor. 2. Bend your left / right knee and hold your ankle. If you cannot reach your ankle or pant leg, loop a belt around your foot and grab the belt instead. 3. Gently pull your heel toward your buttocks.  Your knee should not slide out to the side. You should feel a stretch in the front of your thigh and knee. 4. Hold this position for 30 seconds. Repeat 2 times. Complete this stretch 3 times per week. Exercise D: Hamstring, Supine  1. Lie on your back. 2. Loop a belt or towel over the ball of your left / right foot. The ball of your foot is on the walking surface, right under your toes. 3. Straighten your left / right knee and slowly pull on the belt to raise your leg until you feel a gentle stretch behind your knee. ? Do not let your left / right knee bend while you do this. ? Keep your other leg flat on the floor. 4. Hold this position for 30 seconds. Repeat 2 times. Complete this stretch 3 times per week. STRENGTHENING EXERCISES  These exercises build strength and endurance in your knee. Endurance is the ability to use your muscles for a long time, even after they get tired. Exercise E: Quadriceps, Isometric    1. Lie on your back with your left / right leg extended and your other knee bent. Put a rolled towel or small pillow under your knee if told by your health care provider. 2. Slowly tense the muscles in the front of your left / right thigh. You  should see your kneecap slide up toward your hip or see increased dimpling just above the knee. This motion will push the back of the knee toward the floor. 3. For 3 seconds, keep the muscle as tight as you can without increasing your pain. 4. Relax the muscles slowly and completely. Repeat for 10 total reps Repeat 2 ti mes. Complete this exercise 3 times per week. Exercise F: Straight Leg Raises - Quadriceps  1. Lie on your back with your left / right leg extended and your other knee bent. 2. Tense the muscles in the front of your left / right thigh. You should see your kneecap slide up or see increased dimpling just above the knee. Your thigh may even shake a bit. 3. Keep these muscles tight as you raise your leg 4-6 inches (10-15 cm) off  the floor. Do not let your knee bend. 4. Hold this position for 3 seconds. 5. Keep these muscles tense as you lower your leg. 6. Relax your muscles slowly and completely after each repetition. 10 total reps. Repeat 2 times. Complete this exercise 3 times per week.  Exercise G: Hamstring Curls    If told by your health care provider, do this exercise while wearing ankle weights. Begin with 5 lb weights (optional). Then increase the weight by 1 lb (0.5 kg) increments. Do not wear ankle weights that are more than 20 lbs to start with. 1. Lie on your abdomen with your legs straight. 2. Bend your left / right knee as far as you can without feeling pain. Keep your hips flat against the floor. 3. Hold this position for 3 seconds. 4. Slowly lower your leg to the starting position. Repeat for 10 reps.  Repeat 2 times. Complete this exercise 3 times per week. Exercise H: Squats (Quadriceps)  1. Stand in front of a table, with your feet and knees pointing straight ahead. You may rest your hands on the table for balance but not for support. 2. Slowly bend your knees and lower your hips like you are going to sit in a chair. ? Keep your weight over your heels, not over your toes. ? Keep your lower legs upright so they are parallel with the table legs. ? Do not let your hips go lower than your knees. ? Do not bend lower than told by your health care provider. ? If your knee pain increases, do not bend as low. 3. Hold the squat position for 1 second. 4. Slowly push with your legs to return to standing. Do not use your hands to pull yourself to standing. Repeat 2 times. Complete this exercise 3 times per week. Exercise I: Wall Slides (Quadriceps)    1. Lean your back against a smooth wall or door while you walk your feet out 18-24 inches (46-61 cm) from it. 2. Place your feet hip-width apart. 3. Slowly slide down the wall or door until your knees Repeat 2 times. Complete this exercise every other  day. 4. Exercise K: Straight Leg Raises - Hip Abductors  1. Lie on your side with your left / right leg in the top position. Lie so your head, shoulder, knee, and hip line up. You may bend your bottom knee to help you keep your balance. 2. Roll your hips slightly forward so your hips are stacked directly over each other and your left / right knee is facing forward. 3. Leading with your heel, lift your top leg 4-6 inches (10-15 cm). You should feel the  muscles in your outer hip lifting. ? Do not let your foot drift forward. ? Do not let your knee roll toward the ceiling. 4. Hold this position for 3 seconds. 5. Slowly return your leg to the starting position. 6. Let your muscles relax completely after each repetition. 10 total reps. Repeat 2 times. Complete this exercise 3 times per week. Exercise J: Straight Leg Raises - Hip Extensors  1. Lie on your abdomen on a firm surface. You can put a pillow under your hips if that is more comfortable. 2. Tense the muscles in your buttocks and lift your left / right leg about 4-6 inches (10-15 cm). Keep your knee straight as you lift your leg. 3. Hold this position for 3 seconds. 4. Slowly lower your leg to the starting position. 5. Let your leg relax completely after each repetition. Repeat 2 times. Complete this exercise 3 times per week. Document Released: 09/13/2005 Document Revised: 07/24/2016 Document Reviewed: 09/05/2015 Elsevier Interactive Patient Education  2017 [employer].   Achilles Tendinitis Rehab It is normal to feel mild stretching, pulling, tightness, or discomfort as you do these exercises, but you should stop right away if you feel sudden pain or your pain gets worse.   Stretching and range of motion exercises These exercises warm up your muscles and joints and improve the movement and flexibility of your ankle. These exercises also help to relieve pain, numbness, and tingling. Exercise A: Standing wall calf stretch, knee  straight  1. Stand with your hands against a wall. 2. Extend your affected leg behind you and bend your front knee slightly. Keep both of your heels on the floor. 3. Point the toes of your back foot slightly inward. 4. Keeping your heels on the floor and your back knee straight, shift your weight toward the wall. Do not allow your back to arch. You should feel a gentle stretch in your calf. 5. Hold this position for seconds. Repeat 2 times. Complete this stretch 3 times per week Exercise B: Standing wall calf stretch, knee bent 1. Stand with your hands against a wall. 2. Extend your affected leg behind you, and bend your front knee slightly. Keep both of your heels on the floor. 3. Point the toes of your back foot slightly inward. 4. Keeping your heels on the floor, unlock your back knee so that it is bent. You should feel a gentle stretch deep in your calf. 5. Hold this position for 30 seconds. Repeat 2 times. Complete this stretch 3 times per week.  Strengthening exercises These exercises build strength and control of your ankle. Endurance is the ability to use your muscles for a long time, even after they get tired. Exercise C: Plantar flexion with band  1. Sit on the floor with your affected leg extended. You may put a pillow under your calf to give your foot more room to move. 2. Loop a rubber exercise band or tube around the ball of your affected foot. The ball of your foot is on the walking surface, right under your toes. The band or tube should be slightly tense when your foot is relaxed. If the band or tube slips, you can put on your shoe or put a washcloth between the band and your foot to help it stay in place. 3. Slowly point your toes downward, pushing them away from you. 4. Hold this position for 10 seconds. 5. Slowly release the tension in the band or tube, controlling smoothly until your foot is  back to the starting position. Repeat 2 times. Complete this exercise 3 times per  week. Exercise D: Heel raise with eccentric lower  1. Stand on a step with the balls of your feet. The ball of your foot is on the walking surface, right under your toes. ? Do not put your heels on the step. ? For balance, rest your hands on the wall or on a railing. 2. Rise up onto the balls of your feet. 3. Keeping your heels up, shift all of your weight to your affected leg and pick up your other leg. 4. Slowly lower your affected leg so your heel drops below the level of the step. 5. Put down your foot. If told by your health care provider, build up to:  3 sets of 15 repetitions while keeping your knees straight.  3 sets of 15 repetitions while keeping your knees bent as far as told by your health care provider.  Complete this exercise 3 times per week. If this exercise is too easy, try doing it while wearing a backpack with weights in it. Balance exercises These exercises improve or maintain your balance. Balance is important in preventing falls. Exercise E: Single leg stand 1. Without shoes, stand near a railing or in a door frame. Hold on to the railing or door frame as needed. 2. Stand on your affected foot. Keep your big toe down on the floor and try to keep your arch lifted. 3. Hold this position for 10 seconds. Repeat 2 times. Complete this exercise 3 times per week. If this exercise is too easy, you can try it with your eyes closed or while standing on a pillow. Make sure you discuss any questions you have with your health care provider. Document Released: 05/31/2005 Document Revised: 07/06/2016 Document Reviewed: 07/06/2015 Elsevier Interactive Patient Education  [employer].
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
## 2020-12-13 ENCOUNTER — Encounter (INDEPENDENT_AMBULATORY_CARE_PROVIDER_SITE_OTHER): Payer: Self-pay | Admitting: Family Medicine

## 2020-12-13 ENCOUNTER — Other Ambulatory Visit: Payer: Self-pay

## 2020-12-13 ENCOUNTER — Ambulatory Visit (INDEPENDENT_AMBULATORY_CARE_PROVIDER_SITE_OTHER): Payer: BC Managed Care – PPO | Admitting: Family Medicine

## 2020-12-13 VITALS — BP 130/90 | HR 93 | Temp 98.8°F | Ht 63.0 in | Wt 234.0 lb

## 2020-12-13 DIAGNOSIS — Z6841 Body Mass Index (BMI) 40.0 and over, adult: Secondary | ICD-10-CM

## 2020-12-13 DIAGNOSIS — Z9189 Other specified personal risk factors, not elsewhere classified: Secondary | ICD-10-CM

## 2020-12-13 DIAGNOSIS — F3289 Other specified depressive episodes: Secondary | ICD-10-CM | POA: Diagnosis not present

## 2020-12-13 DIAGNOSIS — E8881 Metabolic syndrome: Secondary | ICD-10-CM

## 2020-12-13 DIAGNOSIS — R632 Polyphagia: Secondary | ICD-10-CM

## 2020-12-13 LAB — HEPATITIS C ANTIBODY
Hepatitis C Ab: NONREACTIVE
SIGNAL TO CUT-OFF: 0.01 (ref <1.00)

## 2020-12-14 ENCOUNTER — Other Ambulatory Visit: Payer: Self-pay | Admitting: Family Medicine

## 2020-12-14 DIAGNOSIS — I10 Essential (primary) hypertension: Secondary | ICD-10-CM

## 2020-12-14 NOTE — Progress Notes (Signed)
Chief Complaint:   OBESITY Jordan Bush is here to discuss her progress with her obesity treatment plan along with follow-up of her obesity related diagnoses.   Today's visit was #: 20 Starting weight: 266 lbs Starting date: 09/22/2019 Today's weight: 234 lbs Today's date: 12/13/2020 Total lbs lost to date: 32 lbs Body mass index is 41.45 kg/m.  Total weight loss percentage to date: -12.03%  Interim History: Jordan Bush had her CPE.  Reviewed together. Nutrition Plan: the Category 3 Plan for 85% of the time.  Anti-obesity medications: phentermine 18.75 mg daily. Reported side effects: None. Activity: Walking/workout videos for 20-30 minutes 2-3 times per week.  Assessment/Plan:   1. Insulin resistance Improving, but not optimized. Goal is HgbA1c < 5.7, fasting insulin closer to 5.  Medication: metformin XR 500 mg daily.  She will continue to focus on protein-rich, low simple carbohydrate foods. We reviewed the importance of hydration, regular exercise for stress reduction, and restorative sleep.   Lab Results  Component Value Date   HGBA1C 5.5 08/16/2020   Lab Results  Component Value Date   INSULIN 17.0 03/10/2020   INSULIN 32.8 (H) 09/22/2019   2. Polyphagia Controlled.  Hyperphagia, also called polyphagia, refers to excessive feelings of hunger. This is more likely to be an issues for people that have diabetes, prediabetes, or insulin resistance. She will continue to focus on protein-rich, low simple carbohydrate foods. We reviewed the importance of hydration, regular exercise for stress reduction, and restorative sleep.  3. Other depression, with emotional eating Jordan Bush is taking Wellbutrin 150 mg daily.  Behavior modification techniques were discussed today to help Jordan Bush deal with her emotional/non-hunger eating behaviors.  Orders and follow up as documented in patient record.   4. At risk for heart disease Due to Jordan Bush's current state of health and medical  condition(s), she is at a higher risk for heart disease.  This puts the patient at much greater risk to subsequently develop cardiopulmonary conditions that can significantly affect patient's quality of life in a negative manner.    At least 9 minutes were spent on counseling Jordan Bush about these concerns today, and I stressed the importance of reversing risks factors of obesity, especially truncal and visceral fat, hypertension, hyperlipidemia, and pre-diabetes.  The initial goal is to lose at least 5-10% of starting weight to help reduce these risk factors.  Counseling:  Intensive lifestyle modifications were discussed with Jordan Bush as the most appropriate first line of treatment.  she will continue to work on diet, exercise, and weight loss efforts.  We will continue to reassess these conditions on a fairly regular basis in an attempt to decrease the patient's overall morbidity and mortality.  Evidence-based interventions for health behavior change were utilized today including the discussion of self monitoring techniques, problem-solving barriers, and SMART goal setting techniques.  Specifically, regarding patient's less desirable eating habits and patterns, we employed the technique of small changes when Jordan Bush has not been able to fully commit to her prudent nutritional plan.   5. Class 3 severe obesity with serious comorbidity and body mass index (BMI) of 40.0 to 44.9 in adult, unspecified obesity type (Jordan Bush)  Course: Jordan Bush is currently in the action stage of change. As such, her goal is to continue with weight loss efforts.   Nutrition goals: She has agreed to the Category 3 Plan.   Exercise goals: For substantial health benefits, adults should do at least 150 minutes (2 hours and 30 minutes) a week of moderate-intensity, or 75  minutes (1 hour and 15 minutes) a week of vigorous-intensity aerobic physical activity, or an equivalent combination of moderate- and vigorous-intensity aerobic activity.  Aerobic activity should be performed in episodes of at least 10 minutes, and preferably, it should be spread throughout the week.  Behavioral modification strategies: increasing lean protein intake, decreasing simple carbohydrates, increasing vegetables, increasing water intake and decreasing liquid calories.  Jordan Bush has agreed to follow-up with our clinic in 3 weeks. She was informed of the importance of frequent follow-up visits to maximize her success with intensive lifestyle modifications for her multiple health conditions.   Objective:   Blood pressure 130/90, pulse 93, temperature 98.8 F (37.1 C), temperature source Oral, height 5\' 3"  (1.6 m), weight 234 lb (106.1 kg), SpO2 97 %. Body mass index is 41.45 kg/m.  General: Cooperative, alert, well developed, in no acute distress. HEENT: Conjunctivae and lids unremarkable. Cardiovascular: Regular rhythm.  Lungs: Normal work of breathing. Neurologic: No focal deficits.   Lab Results  Component Value Date   CREATININE 1.12 12/10/2020   BUN 19 12/10/2020   NA 139 12/10/2020   K 3.9 12/10/2020   CL 106 12/10/2020   CO2 26 12/10/2020   Lab Results  Component Value Date   ALT 14 12/10/2020   AST 12 12/10/2020   ALKPHOS 61 12/10/2020   BILITOT 0.5 12/10/2020   Lab Results  Component Value Date   HGBA1C 5.5 08/16/2020   HGBA1C 5.2 09/22/2019   HGBA1C 5.6 06/27/2017   Lab Results  Component Value Date   INSULIN 17.0 03/10/2020   INSULIN 32.8 (H) 09/22/2019   Lab Results  Component Value Date   TSH 0.940 09/22/2019   Lab Results  Component Value Date   CHOL 154 12/10/2020   HDL 41.40 12/10/2020   LDLCALC 93 12/10/2020   TRIG 100.0 12/10/2020   CHOLHDL 4 12/10/2020   Lab Results  Component Value Date   WBC 6.0 08/16/2020   HGB 12.4 08/16/2020   HCT 38.3 08/16/2020   MCV 87 08/16/2020   PLT 258 08/16/2020   Lab Results  Component Value Date   IRON 73 08/16/2020   TIBC 255 08/16/2020   FERRITIN 274 (H)  08/16/2020   Attestation Statements:   Reviewed by clinician on day of visit: allergies, medications, problem list, medical history, surgical history, family history, social history, and previous encounter notes.  I, Water quality scientist, CMA, am acting as transcriptionist for Briscoe Deutscher, DO  I have reviewed the above documentation for accuracy and completeness, and I agree with the above. Briscoe Deutscher, DO

## 2020-12-29 ENCOUNTER — Other Ambulatory Visit (HOSPITAL_BASED_OUTPATIENT_CLINIC_OR_DEPARTMENT_OTHER): Payer: Self-pay | Admitting: Family Medicine

## 2020-12-29 ENCOUNTER — Other Ambulatory Visit (INDEPENDENT_AMBULATORY_CARE_PROVIDER_SITE_OTHER): Payer: Self-pay | Admitting: Family Medicine

## 2020-12-29 DIAGNOSIS — R632 Polyphagia: Secondary | ICD-10-CM

## 2020-12-29 DIAGNOSIS — Z1231 Encounter for screening mammogram for malignant neoplasm of breast: Secondary | ICD-10-CM

## 2020-12-29 MED ORDER — TOPIRAMATE 50 MG PO TABS: 50.0000 mg | ORAL_TABLET | Freq: Two times a day (BID) | ORAL | 0 refills | Status: DC

## 2020-12-29 MED ORDER — PHENTERMINE HCL 37.5 MG PO TABS: 18.2500 mg | ORAL_TABLET | Freq: Every day | ORAL | 0 refills | Status: DC

## 2020-12-29 NOTE — Telephone Encounter (Signed)
Please advise 

## 2020-12-29 NOTE — Telephone Encounter (Signed)
Last seen by Dr. Wallace. 

## 2021-01-04 ENCOUNTER — Other Ambulatory Visit: Payer: Self-pay

## 2021-01-04 ENCOUNTER — Ambulatory Visit (HOSPITAL_BASED_OUTPATIENT_CLINIC_OR_DEPARTMENT_OTHER)
Admission: RE | Admit: 2021-01-04 | Discharge: 2021-01-04 | Disposition: A | Discharge status: Home / Self Care | Payer: BC Managed Care – PPO | Source: Ambulatory Visit | Attending: Family Medicine | Admitting: Family Medicine

## 2021-01-04 DIAGNOSIS — Z1231 Encounter for screening mammogram for malignant neoplasm of breast: Secondary | ICD-10-CM

## 2021-01-12 ENCOUNTER — Encounter (INDEPENDENT_AMBULATORY_CARE_PROVIDER_SITE_OTHER): Payer: Self-pay

## 2021-01-12 ENCOUNTER — Ambulatory Visit (INDEPENDENT_AMBULATORY_CARE_PROVIDER_SITE_OTHER): Payer: BC Managed Care – PPO | Admitting: Family Medicine

## 2021-01-17 ENCOUNTER — Ambulatory Visit (INDEPENDENT_AMBULATORY_CARE_PROVIDER_SITE_OTHER): Payer: BC Managed Care – PPO | Admitting: Family Medicine

## 2021-01-17 ENCOUNTER — Encounter (INDEPENDENT_AMBULATORY_CARE_PROVIDER_SITE_OTHER): Payer: Self-pay | Admitting: Family Medicine

## 2021-01-17 ENCOUNTER — Other Ambulatory Visit: Payer: Self-pay

## 2021-01-17 VITALS — BP 115/79 | HR 76 | Temp 98.0°F | Ht 63.0 in | Wt 232.0 lb

## 2021-01-17 DIAGNOSIS — R632 Polyphagia: Secondary | ICD-10-CM | POA: Diagnosis not present

## 2021-01-17 DIAGNOSIS — F3289 Other specified depressive episodes: Secondary | ICD-10-CM

## 2021-01-17 DIAGNOSIS — Z9189 Other specified personal risk factors, not elsewhere classified: Secondary | ICD-10-CM

## 2021-01-17 DIAGNOSIS — I10 Essential (primary) hypertension: Secondary | ICD-10-CM

## 2021-01-17 DIAGNOSIS — Z6841 Body Mass Index (BMI) 40.0 and over, adult: Secondary | ICD-10-CM

## 2021-01-17 DIAGNOSIS — E8881 Metabolic syndrome: Secondary | ICD-10-CM

## 2021-01-17 MED ORDER — PHENTERMINE HCL 37.5 MG PO TABS: 18.2500 mg | ORAL_TABLET | Freq: Every day | ORAL | 0 refills | Status: DC

## 2021-01-17 MED ORDER — TOPIRAMATE 50 MG PO TABS: 50.0000 mg | ORAL_TABLET | Freq: Two times a day (BID) | ORAL | 0 refills | Status: DC

## 2021-01-18 NOTE — Progress Notes (Signed)
Chief Complaint:   OBESITY Jordan Bush is here to discuss her progress with her obesity treatment plan along with follow-up of her obesity related diagnoses.   Today's visit was #: 21 Starting weight: 266 lbs Starting date: 09/22/2019 Today's weight: 232 lbs Today's date: 01/17/2021 Total lbs lost to date: 34 lbs Body mass index is 41.1 kg/m.  Total weight loss percentage to date:  -12.78%  Interim History:  Jordan Bush says that over Valentine's Day she ate a lot of chocolate.  In February, she did the 20 mile challenge.  In March, she will be doing 1 mile per day - walking/running/HIIT.  In June, she plans to do a 5K.  She responds to external motivation, challenges.  Current Meal Plan: the Category 3 Plan for 85% of the time.  Current Exercise Plan: Run-walk for 17 minutes 7 times per week. Current Anti-Obesity Medications: phentermine 18.75 mg daily. Side effects: None.  Assessment/Plan:   1. Polyphagia Controlled. Current treatment: phentermine 18.75 mg daily. Polyphagia refers to excessive feelings of hunger. She will continue to focus on protein-rich, low simple carbohydrate foods. We reviewed the importance of hydration, regular exercise for stress reduction, and restorative sleep.  - Refill phentermine (ADIPEX-P) 37.5 MG tablet; Take 0.5 tablets (18.75 mg total) by mouth daily before breakfast.  Dispense: 15 tablet; Refill: 0  Having again reminded the patient of the "off label" use of Phentermine beyond three consecutive months, and again discussing the risks, benefits, contraindications, and limitations of it's use; given it's role in the successful treatment of obesity thus far and lack of adverse effect, patient has expressed desire and given informed verbal consent to continue use.   I have consulted the [place] Controlled Substances Registry for this patient, and feel the risk/benefit ratio today is favorable for proceeding with this prescription for a controlled substance. The  patient understands monitoring parameters and red flags.   2. Essential hypertension At goal. Medications: Norvasc 5 mg daily.   Plan: Avoid buying foods that are: processed, frozen, or prepackaged to avoid excess salt. We will watch for signs of hypotension as she continues lifestyle modifications. We will continue to monitor closely alongside her PCP and/or Specialist.  Regular follow up with PCP and specialists was also encouraged.   BP Readings from Last 3 Encounters:  01/17/21 115/79  12/13/20 130/90  12/10/20 118/68   Lab Results  Component Value Date   CREATININE 1.12 12/10/2020   3. Insulin resistance Not at goal. Goal is HgbA1c < 5.7, fasting insulin closer to 5.  Medication: metformin 500 mg daily.    Plan:  She will continue to focus on protein-rich, low simple carbohydrate foods. We reviewed the importance of hydration, regular exercise for stress reduction, and restorative sleep.   Lab Results  Component Value Date   HGBA1C 5.5 08/16/2020   Lab Results  Component Value Date   INSULIN 17.0 03/10/2020   INSULIN 32.8 (H) 09/22/2019   4. Other depression, with emotional eating Controlled. Medication: Topamax 50 mg twice daily, Wellbutrin 150 mg daily.  Plan:  Behavior modification techniques were discussed today to help deal with emotional/non-hunger eating behaviors.  Continue Topamax and Wellbutrin.   - Refill topiramate (TOPAMAX) 50 MG tablet; Take 1 tablet (50 mg total) by mouth 2 (two) times daily.  Dispense: 60 tablet; Refill: 0  5. At risk for heart disease Due to Jordan Bush's current state of health and medical condition(s), she is at a higher risk for heart disease.  This puts  the patient at much greater risk to subsequently develop cardiopulmonary conditions that can significantly affect patient's quality of life in a negative manner.    At least 10 minutes were spent on counseling Jordan Bush about these concerns today. Counseling:  Intensive lifestyle  modifications were discussed with Jordan Bush as the most appropriate first line of treatment.  She will continue to work on diet, exercise, and weight loss efforts.  We will continue to reassess these conditions on a fairly regular basis in an attempt to decrease the patient's overall morbidity and mortality.  Evidence-based interventions for health behavior change were utilized today including the discussion of self monitoring techniques, problem-solving barriers, and SMART goal setting techniques.  Specifically, regarding patient's less desirable eating habits and patterns, we employed the technique of small changes when Jordan Bush has not been able to fully commit to her prudent nutritional plan.  6. Class 3 severe obesity with serious comorbidity and body mass index (BMI) of 40.0 to 44.9 in adult, unspecified obesity type (Jordan Bush)  Course: Jordan Bush is currently in the action stage of change. As such, her goal is to continue with weight loss efforts.   Nutrition goals: She has agreed to the Category 3 Plan.  Okay to add beans/chickpea.    Exercise goals: Think about group settings.  CrossFit.  Behavioral modification strategies: increasing lean protein intake, decreasing simple carbohydrates, increasing vegetables and increasing water intake.  Jordan Bush has agreed to follow-up with our clinic in 4 weeks. She was informed of the importance of frequent follow-up visits to maximize her success with intensive lifestyle modifications for her multiple health conditions.   Objective:   Blood pressure 115/79, pulse 76, temperature 98 F (36.7 C), height 5\' 3"  (1.6 m), weight 232 lb (105.2 kg), SpO2 98 %. Body mass index is 41.1 kg/m.  General: Cooperative, alert, well developed, in no acute distress. HEENT: Conjunctivae and lids unremarkable. Cardiovascular: Regular rhythm.  Lungs: Normal work of breathing. Neurologic: No focal deficits.   Lab Results  Component Value Date   CREATININE 1.12 12/10/2020    BUN 19 12/10/2020   NA 139 12/10/2020   K 3.9 12/10/2020   CL 106 12/10/2020   CO2 26 12/10/2020   Lab Results  Component Value Date   ALT 14 12/10/2020   AST 12 12/10/2020   ALKPHOS 61 12/10/2020   BILITOT 0.5 12/10/2020   Lab Results  Component Value Date   HGBA1C 5.5 08/16/2020   HGBA1C 5.2 09/22/2019   HGBA1C 5.6 06/27/2017   Lab Results  Component Value Date   INSULIN 17.0 03/10/2020   INSULIN 32.8 (H) 09/22/2019   Lab Results  Component Value Date   TSH 0.940 09/22/2019   Lab Results  Component Value Date   CHOL 154 12/10/2020   HDL 41.40 12/10/2020   LDLCALC 93 12/10/2020   TRIG 100.0 12/10/2020   CHOLHDL 4 12/10/2020   Lab Results  Component Value Date   WBC 6.0 08/16/2020   HGB 12.4 08/16/2020   HCT 38.3 08/16/2020   MCV 87 08/16/2020   PLT 258 08/16/2020   Lab Results  Component Value Date   IRON 73 08/16/2020   TIBC 255 08/16/2020   FERRITIN 274 (H) 08/16/2020   Attestation Statements:   Reviewed by clinician on day of visit: allergies, medications, problem list, medical history, surgical history, family history, social history, and previous encounter notes.  I, Water quality scientist, CMA, am acting as transcriptionist for Briscoe Deutscher, DO  I have reviewed the above documentation for accuracy  and completeness, and I agree with the above. Briscoe Deutscher, DO

## 2021-01-20 NOTE — Telephone Encounter (Signed)
I dont see records of Colonoscopy in chart .

## 2021-02-02 ENCOUNTER — Other Ambulatory Visit (INDEPENDENT_AMBULATORY_CARE_PROVIDER_SITE_OTHER): Payer: Self-pay | Admitting: Family Medicine

## 2021-02-02 DIAGNOSIS — R632 Polyphagia: Secondary | ICD-10-CM

## 2021-02-03 MED ORDER — PHENTERMINE HCL 37.5 MG PO TABS: 18.2500 mg | ORAL_TABLET | Freq: Every day | ORAL | 0 refills | Status: DC

## 2021-02-03 NOTE — Telephone Encounter (Signed)
Patient will be out before her next OV, please advise

## 2021-02-03 NOTE — Telephone Encounter (Signed)
Last OV with Dr Wallace 

## 2021-02-05 ENCOUNTER — Telehealth: Payer: BC Managed Care – PPO | Admitting: Nurse Practitioner

## 2021-02-05 DIAGNOSIS — J069 Acute upper respiratory infection, unspecified: Secondary | ICD-10-CM

## 2021-02-05 MED ORDER — FLUTICASONE PROPIONATE 50 MCG/ACT NA SUSP: 2.0000 | Freq: Every day | NASAL | 6 refills | Status: DC

## 2021-02-05 MED ORDER — BENZONATATE 100 MG PO CAPS: 100.0000 mg | ORAL_CAPSULE | Freq: Three times a day (TID) | ORAL | 0 refills | Status: DC | PRN

## 2021-02-05 NOTE — Progress Notes (Signed)

## 2021-02-09 ENCOUNTER — Other Ambulatory Visit: Payer: Self-pay

## 2021-02-09 ENCOUNTER — Encounter: Payer: Self-pay | Admitting: Obstetrics & Gynecology

## 2021-02-09 ENCOUNTER — Ambulatory Visit (INDEPENDENT_AMBULATORY_CARE_PROVIDER_SITE_OTHER): Payer: BC Managed Care – PPO | Admitting: Obstetrics & Gynecology

## 2021-02-09 VITALS — BP 123/95 | HR 90 | Ht 62.0 in | Wt 237.0 lb

## 2021-02-09 DIAGNOSIS — Z800 Family history of malignant neoplasm of digestive organs: Secondary | ICD-10-CM | POA: Diagnosis not present

## 2021-02-09 DIAGNOSIS — Z01419 Encounter for gynecological examination (general) (routine) without abnormal findings: Secondary | ICD-10-CM | POA: Diagnosis not present

## 2021-02-09 NOTE — Progress Notes (Signed)
Subjective:     Jordan Bush is a 50 y.o. female here for a routine exam.  Current complaints: Pt denies GYN complaints. She is s/p colonoscopy in 2018. She is to return in 5 years. Both or her parents had colon cancer. There is no other h/o malignancy in her family. Pt has one sibling who is healthy.     Gynecologic History No LMP recorded (lmp unknown). Patient has had a hysterectomy. Contraception: status post hysterectomy Last Pap: 2018. Results were: abnormal Last mammogram: 01/04/2021. Results were: normal  Obstetric History OB History  Gravida Para Term Preterm AB Living  4 2 2     2   SAB IAB Ectopic Multiple Live Births          2    # Outcome Date GA Lbr Len/2nd Weight Sex Delivery Anes PTL Lv  4 Term           3 Term           2 Gravida           1 Gravida            The following portions of the patient's history were reviewed and updated as appropriate: allergies, current medications, past family history, past medical history, past social history, past surgical history and problem list.  Review of Systems Pertinent items are noted in HPI.    Objective:  BP (!) 123/95   Pulse 90   Ht 5\' 2"  (1.575 m)   Wt 237 lb (107.5 kg)   LMP  (LMP Unknown)   BMI 43.35 kg/m  General Appearance:    Alert, cooperative, no distress, appears stated age  Head:    Normocephalic, without obvious abnormality, atraumatic  Eyes:    conjunctiva/corneas clear, EOM's intact, both eyes  Ears:    Normal external ear canals, both ears  Nose:   Nares normal, septum midline, mucosa normal, no drainage    or sinus tenderness  Throat:   Lips, mucosa, and tongue normal; teeth and gums normal  Neck:   Supple, symmetrical, trachea midline, no adenopathy;    thyroid:  no enlargement/tenderness/nodules  Back:     Symmetric, no curvature, ROM normal, no CVA tenderness  Lungs:     respirations unlabored  Chest Wall:    No tenderness or deformity   Heart:    Regular rate and rhythm  Breast Exam:     No tenderness, masses, or nipple abnormality  Abdomen:     Soft, non-tender, bowel sounds active all four quadrants,    no masses, no organomegaly  Genitalia:    Normal female without lesion, discharge or tenderness   Vaginal cuff well healed. No masses palpated.   Extremities:   Extremities normal, atraumatic, no cyanosis or edema  Pulses:   2+ and symmetric all extremities  Skin:   Skin color, texture, turgor normal, no rashes or lesions    Assessment:    Healthy female exam.   Pos family history of colon cancer. Need results of records from Dr. Verdia Kuba. GI    Plan:  F/u in 1 year or sooner prn  Annual mammogram  Colonoscopy in 2023.   Jordan Bush L. Harraway-Smith, M.D., Cherlynn June

## 2021-02-28 ENCOUNTER — Other Ambulatory Visit: Payer: Self-pay

## 2021-02-28 ENCOUNTER — Encounter (INDEPENDENT_AMBULATORY_CARE_PROVIDER_SITE_OTHER): Payer: Self-pay | Admitting: Family Medicine

## 2021-02-28 ENCOUNTER — Ambulatory Visit (INDEPENDENT_AMBULATORY_CARE_PROVIDER_SITE_OTHER): Payer: BC Managed Care – PPO | Admitting: Family Medicine

## 2021-02-28 VITALS — BP 130/82 | HR 98 | Temp 98.4°F | Ht 63.0 in | Wt 233.0 lb

## 2021-02-28 DIAGNOSIS — R632 Polyphagia: Secondary | ICD-10-CM | POA: Diagnosis not present

## 2021-02-28 DIAGNOSIS — I10 Essential (primary) hypertension: Secondary | ICD-10-CM | POA: Diagnosis not present

## 2021-02-28 DIAGNOSIS — Z6841 Body Mass Index (BMI) 40.0 and over, adult: Secondary | ICD-10-CM

## 2021-02-28 DIAGNOSIS — Z9189 Other specified personal risk factors, not elsewhere classified: Secondary | ICD-10-CM

## 2021-02-28 MED ORDER — PHENTERMINE HCL 37.5 MG PO TABS: 18.2500 mg | ORAL_TABLET | Freq: Every day | ORAL | 0 refills | Status: DC

## 2021-02-28 NOTE — Progress Notes (Deleted)
Chief Complaint:   OBESITY Jordan Bush is here to discuss her progress with her obesity treatment plan along with follow-up of her obesity related diagnoses.   Today's visit was #: *** Starting weight: *** Starting date: *** Today's weight: *** Today's date: 02/28/2021 Total lbs lost to date: *** There is no height or weight on file to calculate BMI.  Total weight loss percentage to date: ***  Interim History:***  Current Meal Plan: {MWMwtlossportion/plan2:23431}.  Current Exercise Plan: ***. Current Anti-Obesity Medications: ***. Side effects: ***.  Assessment/Plan:   ***  Course: Jordan Bush is currently in the action stage of change. As such, her goal is to continue with weight loss efforts.   Nutrition goals: She has agreed to {MWMwtlossportion/plan2:23431}.   Exercise goals: {MWM EXERCISE RECS:23473}  Behavioral modification strategies: {MWMwtlossdietstrategies3:23432}.  Jordan Bush has agreed to follow-up with our clinic in {NUMBER 1-10:22536} weeks. She was informed of the importance of frequent follow-up visits to maximize her success with intensive lifestyle modifications for her multiple health conditions.   ***delete paragraph if no labs orderedJustine was informed we would discuss her lab results at her next visit unless there is a critical issue that needs to be addressed sooner. Jordan Bush agreed to keep her next visit at the agreed upon time to discuss these results.  Objective:   There were no vitals taken for this visit. There is no height or weight on file to calculate BMI.  General: Cooperative, alert, well developed, in no acute distress. HEENT: Conjunctivae and lids unremarkable. Cardiovascular: Regular rhythm.  Lungs: Normal work of breathing. Neurologic: No focal deficits.   Lab Results  Component Value Date   CREATININE 1.12 12/10/2020   BUN 19 12/10/2020   NA 139 12/10/2020   K 3.9 12/10/2020   CL 106 12/10/2020   CO2 26 12/10/2020   Lab  Results  Component Value Date   ALT 14 12/10/2020   AST 12 12/10/2020   ALKPHOS 61 12/10/2020   BILITOT 0.5 12/10/2020   Lab Results  Component Value Date   HGBA1C 5.5 08/16/2020   HGBA1C 5.2 09/22/2019   HGBA1C 5.6 06/27/2017   Lab Results  Component Value Date   INSULIN 17.0 03/10/2020   INSULIN 32.8 (H) 09/22/2019   Lab Results  Component Value Date   TSH 0.940 09/22/2019   Lab Results  Component Value Date   CHOL 154 12/10/2020   HDL 41.40 12/10/2020   LDLCALC 93 12/10/2020   TRIG 100.0 12/10/2020   CHOLHDL 4 12/10/2020   Lab Results  Component Value Date   WBC 6.0 08/16/2020   HGB 12.4 08/16/2020   HCT 38.3 08/16/2020   MCV 87 08/16/2020   PLT 258 08/16/2020   Lab Results  Component Value Date   IRON 73 08/16/2020   TIBC 255 08/16/2020   FERRITIN 274 (H) 08/16/2020    Obesity Behavioral Intervention:   Approximately 15 minutes were spent on the discussion below.  ASK: We discussed the diagnosis of obesity with Jordan Bush today and Jordan Bush agreed to give [name] permission to discuss obesity behavioral modification therapy today.  ASSESS: James has the diagnosis of obesity and her BMI today is ***. Jordan Bush {ACTION; IS/IS [id number]} in the action stage of change.   ADVISE: Jordan Bush was educated on the multiple health risks of obesity as well as the benefit of weight loss to improve her health. She was advised of the need for long term treatment and the importance of lifestyle modifications to improve her current health and  to decrease her risk of future health problems.  AGREE: Multiple dietary modification options and treatment options were discussed and Jordan Bush agreed to follow the recommendations documented in the above note.  ARRANGE: Jordan Bush was educated on the importance of frequent visits to treat obesity as outlined per CMS and USPSTF guidelines and agreed to schedule her next follow up appointment today.  Attestation Statements:   Reviewed by  clinician on day of visit: allergies, medications, problem list, medical history, surgical history, family history, social history, and previous encounter notes.  ***(delete if time-based billing not used)Time spent on visit including pre-visit chart review and post-visit care and charting was *** minutes.   I, ***, am acting as transcriptionist for ***.  I have reviewed the above documentation for accuracy and completeness, and I agree with the above. -  ***

## 2021-03-01 NOTE — Progress Notes (Signed)
Chief Complaint:   OBESITY Jordan Bush is here to discuss her progress with her obesity treatment plan along with follow-up of her obesity related diagnoses. Jordan Bush is on the Category 3 Plan and states she is following her eating plan approximately 80% of the time. Jordan Bush states she is walking 150 minutes 3 times per week.  Today's visit was #: 22 Starting weight: 266 lbs Starting date: 09/22/2019 Today's weight: 233 lbs Today's date: 02/28/2021 Total lbs lost to date: 33 lbs Total lbs lost since last in-office visit: 0  Interim History: Jordan Bush voices struggle with continuation of meal plan and slowness of weight loss. She sometimes doesn't eat enough, particularly if she doesn't get breakfast in. Pt has noticed that her muscle tone has increased. She is interested in increasing her resistance training.  Subjective:   1. Sells voices she occasionally doesn't get all food in now that she is on phentermine. PDMP checked- no concerning activity.  2. Essential hypertension Jordan Bush's BP is well controlled. She is on Norvasc and denies chest pain, chest pressure and headache.  3. At risk for deficient intake of food Jordan Bush is at risk for deficient intake of food.  Assessment/Plan:   1. Polyphagia Intensive lifestyle modifications are the first line treatment for this issue. We discussed several lifestyle modifications today and she will continue to work on diet, exercise and weight loss efforts. Orders and follow up as documented in patient record.  Counseling . Polyphagia is excessive hunger. . Causes can include: low blood sugars, hypERthyroidism, PMS, lack of sleep, stress, insulin resistance, diabetes, certain medications, and diets that are deficient in protein and fiber.   - phentermine (ADIPEX-P) 37.5 MG tablet; Take 0.5 tablets (18.75 mg total) by mouth daily before breakfast.  Dispense: 15 tablet; Refill: 0  2. Essential hypertension Jordan Bush is working on  healthy weight loss and exercise to improve blood pressure control. We will watch for signs of hypotension as she continues her lifestyle modifications. Continue current treatment plan.  3. At risk for deficient intake of food Jordan Bush was given approximately 15 minutes of deficit intake of food prevention counseling today. Jordan Bush is at risk for eating too few calories based on current food recall. She was encouraged to focus on meeting caloric and protein goals according to her recommended meal plan.   4. Class 3 severe obesity with serious comorbidity and body mass index (BMI) of 45.0 to 49.9 in adult, unspecified obesity type ([facility]) Jordan Bush is currently in the action stage of change. As such, her goal is to continue with weight loss efforts. She has agreed to the Category 3 Plan. Increase water intake to 5 gulps per drink.  Exercise goals: Pt is to start resistance training 10 minutes 3 times a week.  Behavioral modification strategies: increasing lean protein intake, meal planning and cooking strategies, keeping healthy foods in the home and planning for success.  Jordan Bush has agreed to follow-up with our clinic in 2-3 weeks. She was informed of the importance of frequent follow-up visits to maximize her success with intensive lifestyle modifications for her multiple health conditions.   Objective:   Blood pressure 130/82, pulse 98, temperature 98.4 F (36.9 C), height 5\' 3"  (1.6 m), weight 233 lb (105.7 kg), SpO2 98 %. Body mass index is 41.27 kg/m.  General: Cooperative, alert, well developed, in no acute distress. HEENT: Conjunctivae and lids unremarkable. Cardiovascular: Regular rhythm.  Lungs: Normal work of breathing. Neurologic: No focal deficits.   Lab Results  Component  Value Date   CREATININE 1.12 12/10/2020   BUN 19 12/10/2020   NA 139 12/10/2020   K 3.9 12/10/2020   CL 106 12/10/2020   CO2 26 12/10/2020   Lab Results  Component Value Date   ALT 14 12/10/2020   AST  12 12/10/2020   ALKPHOS 61 12/10/2020   BILITOT 0.5 12/10/2020   Lab Results  Component Value Date   HGBA1C 5.5 08/16/2020   HGBA1C 5.2 09/22/2019   HGBA1C 5.6 06/27/2017   Lab Results  Component Value Date   INSULIN 17.0 03/10/2020   INSULIN 32.8 (H) 09/22/2019   Lab Results  Component Value Date   TSH 0.940 09/22/2019   Lab Results  Component Value Date   CHOL 154 12/10/2020   HDL 41.40 12/10/2020   LDLCALC 93 12/10/2020   TRIG 100.0 12/10/2020   CHOLHDL 4 12/10/2020   Lab Results  Component Value Date   WBC 6.0 08/16/2020   HGB 12.4 08/16/2020   HCT 38.3 08/16/2020   MCV 87 08/16/2020   PLT 258 08/16/2020   Lab Results  Component Value Date   IRON 73 08/16/2020   TIBC 255 08/16/2020   FERRITIN 274 (H) 08/16/2020    Attestation Statements:   Reviewed by clinician on day of visit: allergies, medications, problem list, medical history, surgical history, family history, social history, and previous encounter notes.  Coral Ceo, am acting as transcriptionist for Coralie Common, MD.   I have reviewed the above documentation for accuracy and completeness, and I agree with the above. - Jinny Blossom, MD

## 2021-03-08 ENCOUNTER — Encounter: Payer: Self-pay | Admitting: General Practice

## 2021-03-14 ENCOUNTER — Other Ambulatory Visit: Payer: Self-pay | Admitting: Family Medicine

## 2021-03-14 DIAGNOSIS — I10 Essential (primary) hypertension: Secondary | ICD-10-CM

## 2021-03-15 ENCOUNTER — Encounter (INDEPENDENT_AMBULATORY_CARE_PROVIDER_SITE_OTHER): Payer: Self-pay | Admitting: Family Medicine

## 2021-03-15 DIAGNOSIS — R632 Polyphagia: Secondary | ICD-10-CM

## 2021-03-15 MED ORDER — PHENTERMINE HCL 37.5 MG PO TABS: 18.2500 mg | ORAL_TABLET | Freq: Every day | ORAL | 0 refills | Status: DC

## 2021-03-28 ENCOUNTER — Ambulatory Visit (INDEPENDENT_AMBULATORY_CARE_PROVIDER_SITE_OTHER): Payer: BC Managed Care – PPO | Admitting: Family Medicine

## 2021-03-29 ENCOUNTER — Telehealth: Payer: BC Managed Care – PPO | Admitting: Family Medicine

## 2021-03-29 ENCOUNTER — Other Ambulatory Visit: Payer: Self-pay

## 2021-04-04 ENCOUNTER — Other Ambulatory Visit: Payer: Self-pay

## 2021-04-04 ENCOUNTER — Encounter (INDEPENDENT_AMBULATORY_CARE_PROVIDER_SITE_OTHER): Payer: Self-pay | Admitting: Family Medicine

## 2021-04-04 ENCOUNTER — Ambulatory Visit (INDEPENDENT_AMBULATORY_CARE_PROVIDER_SITE_OTHER): Payer: BC Managed Care – PPO | Admitting: Family Medicine

## 2021-04-04 VITALS — BP 113/76 | HR 77 | Temp 98.2°F | Ht 63.0 in | Wt 228.0 lb

## 2021-04-04 DIAGNOSIS — J4521 Mild intermittent asthma with (acute) exacerbation: Secondary | ICD-10-CM

## 2021-04-04 DIAGNOSIS — E8881 Metabolic syndrome: Secondary | ICD-10-CM

## 2021-04-04 DIAGNOSIS — Z9189 Other specified personal risk factors, not elsewhere classified: Secondary | ICD-10-CM

## 2021-04-04 DIAGNOSIS — R632 Polyphagia: Secondary | ICD-10-CM

## 2021-04-04 DIAGNOSIS — Z6841 Body Mass Index (BMI) 40.0 and over, adult: Secondary | ICD-10-CM

## 2021-04-04 MED ORDER — PHENTERMINE HCL 37.5 MG PO TABS: 18.2500 mg | ORAL_TABLET | Freq: Every day | ORAL | 0 refills | Status: DC

## 2021-04-04 MED ORDER — FLOVENT HFA 110 MCG/ACT IN AERO: 1.0000 | INHALATION_SPRAY | Freq: Two times a day (BID) | RESPIRATORY_TRACT | 2 refills | Status: DC

## 2021-04-04 NOTE — Progress Notes (Signed)
Chief Complaint:   OBESITY Jordan Bush is here to discuss her progress with her obesity treatment plan along with follow-up of her obesity related diagnoses.   Today's visit was #: 23 Starting weight: 266 lbs Starting date: 09/22/2019 Total lbs lost to date: 38 lbs Total weight loss percentage to date: -14.29%  Interim History: Avleen recently had [place].  She is still having exercise intolerance.   Nutrition Plan: Category 3 Plan for 20-30% of the time. Anti-obesity medications: phentermine 18.75 mg daily. Reported side effects: None. Activity: Walking 3-3.5 miles/resistance training 1-3 times per week.  Assessment/Plan:   1. Polyphagia Controlled. Current treatment: phentermine 18.75 mg daily. Polyphagia refers to excessive feelings of hunger. She will continue to focus on protein-rich, low simple carbohydrate foods. We reviewed the importance of hydration, regular exercise for stress reduction, and restorative sleep.  - Refill phentermine (ADIPEX-P) 37.5 MG tablet; Take 0.5 tablets (18.75 mg total) by mouth daily before breakfast.  Dispense: 45 tablet; Refill: 0  2. Mild intermittent asthma with acute exacerbation Start Flovent 1 puff twice daily for allergy-related asthma prevention.  - Start fluticasone (FLOVENT HFA) 110 MCG/ACT inhaler; Inhale 1 puff into the lungs in the morning and at bedtime.  Dispense: 1 each; Refill: 2  3. Metabolic syndrome Starting goal: Lose 7-10% of starting weight. She will continue to focus on protein-rich, low simple carbohydrate foods. We reviewed the importance of hydration, regular exercise for stress reduction, and restorative sleep.  We will continue to check lab work every 3 months, with 10% weight loss, or should any other concerns arise.  4. At risk for activity intolerance Xaniyah was given approximately 8 minutes of counseling today regarding her increased risk for exercise intolerance due to shortness of breath.  We discussed patient's  specific personal and medical issues that raise our concern.  She was advised of strategies to prevent injury and ways to improve her cardiopulmonary fitness levels slowly over time.  We additionally discussed various fitness trackers and smart phone apps to help motivate the patient to stay on track.   5. Obesity, current BMI 40.5  Course: Jeidy is currently in the action stage of change. As such, her goal is to continue with weight loss efforts.   Nutrition goals: She has agreed to the Category 3 Plan.   Exercise goals: As tolerated.  Behavioral modification strategies: increasing lean protein intake, decreasing simple carbohydrates, increasing vegetables and increasing water intake.  Brithany has agreed to follow-up with our clinic in 4 weeks. She was informed of the importance of frequent follow-up visits to maximize her success with intensive lifestyle modifications for her multiple health conditions.   Objective:   Blood pressure 113/76, pulse 77, temperature 98.2 F (36.8 C), temperature source Oral, height 5\' 3"  (1.6 m), weight 228 lb (103.4 kg), SpO2 99 %. Body mass index is 40.39 kg/m.  General: Cooperative, alert, well developed, in no acute distress. HEENT: Conjunctivae and lids unremarkable. Cardiovascular: Regular rhythm.  Lungs: Normal work of breathing. Neurologic: No focal deficits.   Lab Results  Component Value Date   CREATININE 1.12 12/10/2020   BUN 19 12/10/2020   NA 139 12/10/2020   K 3.9 12/10/2020   CL 106 12/10/2020   CO2 26 12/10/2020   Lab Results  Component Value Date   ALT 14 12/10/2020   AST 12 12/10/2020   ALKPHOS 61 12/10/2020   BILITOT 0.5 12/10/2020   Lab Results  Component Value Date   HGBA1C 5.5 08/16/2020  HGBA1C 5.2 09/22/2019   HGBA1C 5.6 06/27/2017   Lab Results  Component Value Date   INSULIN 17.0 03/10/2020   INSULIN 32.8 (H) 09/22/2019   Lab Results  Component Value Date   TSH 0.940 09/22/2019   Lab Results   Component Value Date   CHOL 154 12/10/2020   HDL 41.40 12/10/2020   LDLCALC 93 12/10/2020   TRIG 100.0 12/10/2020   CHOLHDL 4 12/10/2020   Lab Results  Component Value Date   WBC 6.0 08/16/2020   HGB 12.4 08/16/2020   HCT 38.3 08/16/2020   MCV 87 08/16/2020   PLT 258 08/16/2020   Lab Results  Component Value Date   IRON 73 08/16/2020   TIBC 255 08/16/2020   FERRITIN 274 (H) 08/16/2020   Attestation Statements:   Reviewed by clinician on day of visit: allergies, medications, problem list, medical history, surgical history, family history, social history, and previous encounter notes.  I, Water quality scientist, CMA, am acting as transcriptionist for Briscoe Deutscher, DO  I have reviewed the above documentation for accuracy and completeness, and I agree with the above. Briscoe Deutscher, DO

## 2021-05-02 ENCOUNTER — Other Ambulatory Visit: Payer: Self-pay

## 2021-05-02 ENCOUNTER — Ambulatory Visit (INDEPENDENT_AMBULATORY_CARE_PROVIDER_SITE_OTHER): Payer: BC Managed Care – PPO | Admitting: Adult Health

## 2021-05-02 ENCOUNTER — Encounter (INDEPENDENT_AMBULATORY_CARE_PROVIDER_SITE_OTHER): Payer: Self-pay | Admitting: Adult Health

## 2021-05-02 VITALS — BP 121/82 | HR 71 | Temp 98.3°F | Ht 63.0 in | Wt 229.0 lb

## 2021-05-02 DIAGNOSIS — Z9189 Other specified personal risk factors, not elsewhere classified: Secondary | ICD-10-CM | POA: Diagnosis not present

## 2021-05-02 DIAGNOSIS — Z6841 Body Mass Index (BMI) 40.0 and over, adult: Secondary | ICD-10-CM | POA: Diagnosis not present

## 2021-05-02 DIAGNOSIS — R632 Polyphagia: Secondary | ICD-10-CM

## 2021-05-02 DIAGNOSIS — F3289 Other specified depressive episodes: Secondary | ICD-10-CM

## 2021-05-02 MED ORDER — TOPIRAMATE 50 MG PO TABS
50.0000 mg | ORAL_TABLET | Freq: Every day | ORAL | 0 refills | Status: DC
Start: 2021-05-02 — End: 2021-07-11

## 2021-05-02 MED ORDER — BUPROPION HCL ER (SR) 150 MG PO TB12: ORAL_TABLET | ORAL | 0 refills | Status: DC

## 2021-05-03 DIAGNOSIS — F32A Depression, unspecified: Secondary | ICD-10-CM | POA: Insufficient documentation

## 2021-05-03 DIAGNOSIS — R632 Polyphagia: Secondary | ICD-10-CM | POA: Insufficient documentation

## 2021-05-03 NOTE — Progress Notes (Signed)
Chief Complaint:   OBESITY Jordan Bush is here to discuss her progress with her obesity treatment plan along with follow-up of her obesity related diagnoses. Jordan Bush is on the Category 3 Plan and states she is following her eating plan approximately 75% of the time. Jordan Bush states she is walking and running 60 minutes 2 times per week.  Today's visit was #: 24 Starting weight: 266 lbs Starting date: 09/22/2019 Today's weight: 229 lbs Today's date: 05/02/2021 Total lbs lost to date: 37 Total lbs lost since last in-office visit: 0  Interim History: Jordan Bush is still experiencing nonproductive cough and fatigue from recent COVID-19 infection (4 weeks ago).  Her appetite is slowly returning and she has resumed cardio exercise. She has a 5k road race to celebrate her birthday this weekend!  Subjective:   1. Polyphagia PDMP reviewed- Phentermine 37.5 mg, 1/2 tab QD- 45 tabs- filled 04/04/2021 for 90 day supply.  She denies palpitations. BP/HR stable at OV.  2. Other depression, with emotional eating Jordan Bush is on topiramate 50 mg BID (however taking QD) and bupropion SR 150 mg QD.  BP/HR excellent at OV. She denies history of seizures or nephrolithiasis.  3. At risk for deficient intake of food Jordan Bush is at risk for deficient intake of food due to recovering from COVID-19 infection with poor appetite.  Assessment/Plan:   1. Polyphagia Intensive lifestyle modifications are the first line treatment for this issue. We discussed several lifestyle modifications today and she will continue to work on diet, exercise and weight loss efforts. Orders and follow up as documented in patient record. Continue Phentermine as directed.  Counseling Polyphagia is excessive hunger. Causes can include: low blood sugars, hypERthyroidism, PMS, lack of sleep, stress, insulin resistance, diabetes, certain medications, and diets that are deficient in protein and fiber.   2. Other depression, with emotional  eating Behavior modification techniques were discussed today to help Jordan Bush deal with her emotional/non-hunger eating behaviors.  Orders and follow up as documented in patient record.   - buPROPion (WELLBUTRIN SR) 150 MG 12 hr tablet; TAKE 1 TABLET(150 MG) BY MOUTH DAILY  Dispense: 90 tablet; Refill: 0 - topiramate (TOPAMAX) 50 MG tablet; Take 1 tablet (50 mg total) by mouth daily.  Dispense: 90 tablet; Refill: 0  3. At risk for deficient intake of food Jordan Bush was given approximately 15 minutes of deficit intake of food prevention counseling today. Jordan Bush is at risk for eating too few calories based on current food recall. She was encouraged to focus on meeting caloric and protein goals according to her recommended meal plan.    4. Class 3 severe obesity with serious comorbidity and body mass index (BMI) of 40.0 to 44.9 in adult, unspecified obesity type (HCC)  Jordan Bush is currently in the action stage of change. As such, her goal is to continue with weight loss efforts. She has agreed to the Category 3 Plan.   -Check fasting labs at next OV.  Exercise goals:  As is  Behavioral modification strategies: increasing lean protein intake, decreasing simple carbohydrates, increasing water intake, meal planning and cooking strategies, keeping healthy foods in the home, and planning for success.  Jordan Bush has agreed to follow-up with our clinic in 4 weeks- fasting. She was informed of the importance of frequent follow-up visits to maximize her success with intensive lifestyle modifications for her multiple health conditions.   Objective:   Blood pressure 121/82, pulse 71, temperature 98.3 F (36.8 C), height 5\' 3"  (1.6 m), weight 229 lb (  103.9 kg), SpO2 98 %. Body mass index is 40.57 kg/m.  General: Cooperative, alert, well developed, in no acute distress. HEENT: Conjunctivae and lids unremarkable. Cardiovascular: Regular rhythm.  Lungs: Normal work of breathing. Neurologic: No focal  deficits.   Lab Results  Component Value Date   CREATININE 1.12 12/10/2020   BUN 19 12/10/2020   NA 139 12/10/2020   K 3.9 12/10/2020   CL 106 12/10/2020   CO2 26 12/10/2020   Lab Results  Component Value Date   ALT 14 12/10/2020   AST 12 12/10/2020   ALKPHOS 61 12/10/2020   BILITOT 0.5 12/10/2020   Lab Results  Component Value Date   HGBA1C 5.5 08/16/2020   HGBA1C 5.2 09/22/2019   HGBA1C 5.6 06/27/2017   Lab Results  Component Value Date   INSULIN 17.0 03/10/2020   INSULIN 32.8 (H) 09/22/2019   Lab Results  Component Value Date   TSH 0.940 09/22/2019   Lab Results  Component Value Date   CHOL 154 12/10/2020   HDL 41.40 12/10/2020   LDLCALC 93 12/10/2020   TRIG 100.0 12/10/2020   CHOLHDL 4 12/10/2020   Lab Results  Component Value Date   WBC 6.0 08/16/2020   HGB 12.4 08/16/2020   HCT 38.3 08/16/2020   MCV 87 08/16/2020   PLT 258 08/16/2020   Lab Results  Component Value Date   IRON 73 08/16/2020   TIBC 255 08/16/2020   FERRITIN 274 (H) 08/16/2020     Attestation Statements:   Reviewed by clinician on day of visit: allergies, medications, problem list, medical history, surgical history, family history, social history, and previous encounter notes.  Coral Ceo, CMA, am acting as transcriptionist for Mina Marble, NP.  I have reviewed the above documentation for accuracy and completeness, and I agree with the above. -  Cliff Damiani d. Edis Huish, NP-C

## 2021-05-30 ENCOUNTER — Ambulatory Visit (INDEPENDENT_AMBULATORY_CARE_PROVIDER_SITE_OTHER): Payer: BC Managed Care – PPO | Admitting: Family Medicine

## 2021-05-30 ENCOUNTER — Other Ambulatory Visit: Payer: Self-pay

## 2021-05-30 ENCOUNTER — Encounter (INDEPENDENT_AMBULATORY_CARE_PROVIDER_SITE_OTHER): Payer: Self-pay | Admitting: Family Medicine

## 2021-05-30 ENCOUNTER — Encounter (INDEPENDENT_AMBULATORY_CARE_PROVIDER_SITE_OTHER): Payer: Self-pay

## 2021-05-30 VITALS — BP 124/85 | HR 78 | Temp 98.1°F | Ht 63.0 in | Wt 229.0 lb

## 2021-05-30 DIAGNOSIS — E559 Vitamin D deficiency, unspecified: Secondary | ICD-10-CM

## 2021-05-30 DIAGNOSIS — Z9189 Other specified personal risk factors, not elsewhere classified: Secondary | ICD-10-CM | POA: Diagnosis not present

## 2021-05-30 DIAGNOSIS — Z6841 Body Mass Index (BMI) 40.0 and over, adult: Secondary | ICD-10-CM

## 2021-05-30 DIAGNOSIS — E7800 Pure hypercholesterolemia, unspecified: Secondary | ICD-10-CM | POA: Diagnosis not present

## 2021-05-30 DIAGNOSIS — E8881 Metabolic syndrome: Secondary | ICD-10-CM

## 2021-05-31 LAB — COMPREHENSIVE METABOLIC PANEL
ALT: 17 IU/L (ref 0–32)
AST: 15 IU/L (ref 0–40)
Albumin/Globulin Ratio: 1.5 (ref 1.2–2.2)
Albumin: 4.2 g/dL (ref 3.8–4.8)
Alkaline Phosphatase: 71 IU/L (ref 44–121)
BUN/Creatinine Ratio: 14 (ref 9–23)
BUN: 14 mg/dL (ref 6–24)
Bilirubin Total: 0.4 mg/dL (ref 0.0–1.2)
CO2: 17 mmol/L — ABNORMAL LOW (ref 20–29)
Calcium: 9.3 mg/dL (ref 8.7–10.2)
Chloride: 104 mmol/L (ref 96–106)
Creatinine, Ser: 1.01 mg/dL — ABNORMAL HIGH (ref 0.57–1.00)
Globulin, Total: 2.8 g/dL (ref 1.5–4.5)
Glucose: 87 mg/dL (ref 65–99)
Potassium: 4 mmol/L (ref 3.5–5.2)
Sodium: 140 mmol/L (ref 134–144)
Total Protein: 7 g/dL (ref 6.0–8.5)
eGFR: 68 mL/min/{1.73_m2} (ref >59)

## 2021-05-31 LAB — HEMOGLOBIN A1C
Est. average glucose Bld gHb Est-mCnc: 105 mg/dL
Hgb A1c MFr Bld: 5.3 % (ref 4.8–5.6)

## 2021-05-31 LAB — LIPID PANEL WITH LDL/HDL RATIO
Cholesterol, Total: 170 mg/dL (ref 100–199)
HDL: 48 mg/dL (ref >39)
LDL Chol Calc (NIH): 104 mg/dL — ABNORMAL HIGH (ref 0–99)
LDL/HDL Ratio: 2.2 ratio (ref 0.0–3.2)
Triglycerides: 101 mg/dL (ref 0–149)
VLDL Cholesterol Cal: 18 mg/dL (ref 5–40)

## 2021-05-31 LAB — INSULIN, RANDOM: INSULIN: 10.3 u[IU]/mL (ref 2.6–24.9)

## 2021-05-31 LAB — VITAMIN D 25 HYDROXY (VIT D DEFICIENCY, FRACTURES): Vit D, 25-Hydroxy: 45.4 ng/mL (ref 30.0–100.0)

## 2021-06-02 NOTE — Progress Notes (Signed)
Chief Complaint:   OBESITY Jordan Bush is here to discuss her progress with her obesity treatment plan along with follow-up of her obesity related diagnoses. Jordan Bush is on the Category 3 Plan and states she is following her eating plan approximately 85% of the time. Jordan Bush states she is walking, running, and resistance training 30-60 minutes 3 times per week.  Today's visit was #: 25 Starting weight: 266 lbs Starting date: 09/22/2019 Today's weight: 229 lbs Today's date: 05/30/2021 Total lbs lost to date: 37 Total lbs lost since last in-office visit: 0  Interim History: Since her last appt, Jordan Bush's daughter got married and moved, which has been a load of stress off pt's plate. 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 Pt has been trying to be more mindful of not over committing. Food wise, breakfast has been a challenge- protein shake for breakfast or fairlife milk. Hunger is well controlled.  Subjective:   1. Insulin resistance Minka's insulin level is 17.0 and A1c 5.5. She is on Phentermine and Topamax.  2. Vitamin D deficiency Pt denies nausea, vomiting, and muscle weakness. She is on OTC Vit D.  3. Pure hypercholesterolemia Jordan Bush's LDL is 93, HDL 41, and triglycerides 100. She denies transaminitis.  4. At risk for diabetes mellitus Jordan Bush is at higher than average risk for developing diabetes due to obesity.    Assessment/Plan:   1. Insulin resistance Keoni will continue to work on weight loss, exercise, and decreasing simple carbohydrates to help decrease the risk of diabetes. Adrain agreed to follow-up with [name] as directed to closely monitor her progress. Check labs today.  - Comprehensive metabolic panel - Hemoglobin A1c - Insulin, random  2. Vitamin D deficiency Low Vitamin D level contributes to fatigue and are associated with obesity, breast, and colon cancer. She agrees to continue to take OTC Vitamin D daily and will follow-up  for routine testing of Vitamin D, at least 2-3 times per year to avoid over-replacement. Check labs today.  - VITAMIN D 25 Hydroxy (Vit-D Deficiency, Fractures)  3. Pure hypercholesterolemia Cardiovascular risk and specific lipid/LDL goals reviewed.  We discussed several lifestyle modifications today and Jordan Bush will continue to work on diet, exercise and weight loss efforts. Orders and follow up as documented in patient record.   Counseling Intensive lifestyle modifications are the first line treatment for this issue. Dietary changes: Increase soluble fiber. Decrease simple carbohydrates. Exercise changes: Moderate to vigorous-intensity aerobic activity 150 minutes per week if tolerated. Lipid-lowering medications: see documented in medical record. Check labs today.  - Lipid Panel With LDL/HDL Ratio  4. At risk for diabetes mellitus Jordan Bush was given approximately 15 minutes of diabetes education and counseling today. We discussed intensive lifestyle modifications today with an emphasis on weight loss as well as increasing exercise and decreasing simple carbohydrates in her diet. We also reviewed medication options with an emphasis on risk versus benefit of those discussed.   Repetitive spaced learning was employed today to elicit superior memory formation and behavioral change.   5. Class 3 severe obesity with serious comorbidity and body mass index (BMI) of 40.0 to 44.9 in adult, unspecified obesity type [facility])  Jordan Bush is currently in the action stage of change. As such, her goal is to continue with weight loss efforts. She has agreed to the Category 3 Plan.   Exercise goals:  As is  Behavioral modification strategies: increasing lean protein intake, no skipping meals, meal planning and cooking strategies,  and planning for success.  Valine has agreed to follow-up with our clinic in 3 weeks. She was informed of the importance of frequent follow-up visits to maximize her success with  intensive lifestyle modifications for her multiple health conditions.   Jordan Bush was informed we would discuss her lab results at her next visit unless there is a critical issue that needs to be addressed sooner. Jordan Bush agreed to keep her next visit at the agreed upon time to discuss these results.  Objective:   Blood pressure 124/85, pulse 78, temperature 98.1 F (36.7 C), height 5\' 3"  (1.6 m), weight 229 lb (103.9 kg), SpO2 99 %. Body mass index is 40.57 kg/m.  General: Cooperative, alert, well developed, in no acute distress. HEENT: Conjunctivae and lids unremarkable. Cardiovascular: Regular rhythm.  Lungs: Normal work of breathing. Neurologic: No focal deficits.   Lab Results  Component Value Date   CREATININE 1.01 (H) 05/30/2021   BUN 14 05/30/2021   NA 140 05/30/2021   K 4.0 05/30/2021   CL 104 05/30/2021   CO2 17 (L) 05/30/2021   Lab Results  Component Value Date   ALT 17 05/30/2021   AST 15 05/30/2021   ALKPHOS 71 05/30/2021   BILITOT 0.4 05/30/2021   Lab Results  Component Value Date   HGBA1C 5.3 05/30/2021   HGBA1C 5.5 08/16/2020   HGBA1C 5.2 09/22/2019   HGBA1C 5.6 06/27/2017   Lab Results  Component Value Date   INSULIN 10.3 05/30/2021   INSULIN 17.0 03/10/2020   INSULIN 32.8 (H) 09/22/2019   Lab Results  Component Value Date   TSH 0.940 09/22/2019   Lab Results  Component Value Date   CHOL 170 05/30/2021   HDL 48 05/30/2021   LDLCALC 104 (H) 05/30/2021   TRIG 101 05/30/2021   CHOLHDL 4 12/10/2020   Lab Results  Component Value Date   VD25OH 45.4 05/30/2021   VD25OH 70.4 08/16/2020   VD25OH 63.1 03/10/2020   Lab Results  Component Value Date   WBC 6.0 08/16/2020   HGB 12.4 08/16/2020   HCT 38.3 08/16/2020   MCV 87 08/16/2020   PLT 258 08/16/2020   Lab Results  Component Value Date   IRON 73 08/16/2020   TIBC 255 08/16/2020   FERRITIN 274 (H) 08/16/2020    Attestation Statements:   Reviewed by clinician on day of visit:  allergies, medications, problem list, medical history, surgical history, family history, social history, and previous encounter notes.  Coral Ceo, CMA, am acting as transcriptionist for Coralie Common, MD.  I have reviewed the above documentation for accuracy and completeness, and I agree with the above. - Coralie Common, MD

## 2021-06-10 ENCOUNTER — Ambulatory Visit: Payer: BC Managed Care – PPO | Admitting: Family Medicine

## 2021-06-10 ENCOUNTER — Other Ambulatory Visit: Payer: Self-pay

## 2021-06-10 ENCOUNTER — Encounter: Payer: Self-pay | Admitting: Family Medicine

## 2021-06-10 DIAGNOSIS — I10 Essential (primary) hypertension: Secondary | ICD-10-CM | POA: Diagnosis not present

## 2021-06-10 DIAGNOSIS — J3089 Other allergic rhinitis: Secondary | ICD-10-CM

## 2021-06-10 MED ORDER — AMLODIPINE BESYLATE 5 MG PO TABS: ORAL_TABLET | ORAL | 2 refills | Status: DC

## 2021-06-10 MED ORDER — LEVOCETIRIZINE DIHYDROCHLORIDE 5 MG PO TABS: 5.0000 mg | ORAL_TABLET | Freq: Every day | ORAL | 3 refills | Status: DC

## 2021-06-10 MED ORDER — MONTELUKAST SODIUM 10 MG PO TABS: ORAL_TABLET | ORAL | 2 refills | Status: DC

## 2021-06-10 MED ORDER — EPINEPHRINE 0.3 MG/0.3ML IJ SOAJ: 0.3000 mg | Freq: Once | INTRAMUSCULAR | 1 refills | Status: DC

## 2021-06-10 MED ORDER — FLUTICASONE PROPIONATE 50 MCG/ACT NA SUSP: 2.0000 | Freq: Every day | NASAL | 11 refills | Status: DC
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
## 2021-06-10 NOTE — Patient Instructions (Signed)
Keep the diet clean and stay active.  See if the Xyzal is cheaper as a prescription.   Let us know if you need anything.

## 2021-06-16 ENCOUNTER — Encounter (INDEPENDENT_AMBULATORY_CARE_PROVIDER_SITE_OTHER): Payer: Self-pay

## 2021-06-27 ENCOUNTER — Ambulatory Visit (INDEPENDENT_AMBULATORY_CARE_PROVIDER_SITE_OTHER): Payer: BC Managed Care – PPO | Admitting: Family Medicine

## 2021-06-27 ENCOUNTER — Other Ambulatory Visit: Payer: Self-pay

## 2021-06-27 ENCOUNTER — Encounter (INDEPENDENT_AMBULATORY_CARE_PROVIDER_SITE_OTHER): Payer: Self-pay | Admitting: Family Medicine

## 2021-06-27 VITALS — BP 125/80 | HR 52 | Temp 98.3°F | Ht 63.0 in | Wt 228.0 lb

## 2021-06-27 DIAGNOSIS — Z9189 Other specified personal risk factors, not elsewhere classified: Secondary | ICD-10-CM

## 2021-06-27 DIAGNOSIS — Z6841 Body Mass Index (BMI) 40.0 and over, adult: Secondary | ICD-10-CM

## 2021-06-27 DIAGNOSIS — E8881 Metabolic syndrome: Secondary | ICD-10-CM

## 2021-06-27 DIAGNOSIS — E88819 Insulin resistance, unspecified: Secondary | ICD-10-CM

## 2021-06-27 DIAGNOSIS — I10 Essential (primary) hypertension: Secondary | ICD-10-CM

## 2021-06-27 DIAGNOSIS — E7800 Pure hypercholesterolemia, unspecified: Secondary | ICD-10-CM

## 2021-06-29 NOTE — Progress Notes (Signed)
Chief Complaint:   OBESITY Jordan Bush is here to discuss her progress with her obesity treatment plan along with follow-up of her obesity related diagnoses.   Today's visit was #: 46 Starting weight: 166 lbs Starting date: 09/22/2019 Today's weight: 228 lbs Today's date: 06/27/2021 Weight change since last visit: -1 lb Total lbs lost to date: 38 lbs Body mass index is 40.39 kg/m.  Total weight loss percentage to date: -14.29%  Current Meal Plan: the Category 3 Plan for 90% of the time.  Current Exercise Plan: Walking for 60 minutes 2 times per week. Current Anti-Obesity Medications: Phentermine 37.5 mg daily. Side effects: None.  Interim History: Jordan Bush reports that she signed up for races. She also leads a run group. She is now the [employer] for "[employer]". Her next race in September 11th.  Assessment/Plan:   1. Essential hypertension Controlled. Medications: Norvasc 5 mg daily.   Plan: Avoid buying foods that are: processed, frozen, or prepackaged to avoid excess salt. We will watch for signs of hypotension as she continues lifestyle modifications. We will continue to monitor closely alongside her PCP and/or Specialist.    BP Readings from Last 3 Encounters:  06/27/21 125/80  06/10/21 124/74  05/30/21 124/85   Lab Results  Component Value Date   CREATININE 1.01 (H) 05/30/2021   2. Insulin resistance, with polyphagia Improving, but not optimized. Goal is HgbA1c < 5.7, fasting insulin closer to 5.  Medication: Phentermine 37.5 mg daily and Topamax 50 mg daily.    Plan:  She will continue to focus on protein-rich, low simple carbohydrate foods. We reviewed the importance of hydration, regular exercise for stress reduction, and restorative sleep.   Lab Results  Component Value Date   HGBA1C 5.3 05/30/2021   Lab Results  Component Value Date   INSULIN 10.3 05/30/2021   INSULIN 17.0 03/10/2020   INSULIN 32.8 (H) 09/22/2019   3. Pure hypercholesterolemia   Course: Not at goal. Lipid-lowering medications: None.   Plan: Dietary changes: Increase soluble fiber, decrease simple carbohydrates, decrease saturated fat. Exercise changes: Moderate to vigorous-intensity aerobic activity 150 minutes per week or as tolerated. We will continue to monitor along with PCP/specialists as it pertains to her weight loss journey.  Lab Results  Component Value Date   CHOL 170 05/30/2021   HDL 48 05/30/2021   LDLCALC 104 (H) 05/30/2021   TRIG 101 05/30/2021   CHOLHDL 4 12/10/2020   Lab Results  Component Value Date   ALT 17 05/30/2021   AST 15 05/30/2021   ALKPHOS 71 05/30/2021   BILITOT 0.4 05/30/2021   The 10-year ASCVD risk score Mikey Bussing DC Jr., et al., 2013) is: 3%   Values used to calculate the score:     Age: 50 years     Sex: Female     Is Non-Hispanic African American: Yes     Diabetic: No     Tobacco smoker: No     Systolic Blood Pressure: 0000000 mmHg     Is BP treated: Yes     HDL Cholesterol: 48 mg/dL     Total Cholesterol: 170 mg/dL  4. At risk for heart disease Due to [place]'s current state of health and medical condition(s), she is at a higher risk for heart disease.   This puts the patient at much greater risk to subsequently develop cardiopulmonary conditions that can significantly affect patient's quality of life in a negative manner as well.    At least 8 minutes was  spent on counseling Jordan Bush about these concerns today. Initial goal is to lose at least 5-10% of starting weight to help reduce these risk factors.  We will continue to reassess these conditions on a fairly regular basis in an attempt to decrease patient's overall morbidity and mortality.  Evidence-based interventions for health behavior change were utilized today including the discussion of self monitoring techniques, problem-solving barriers and SMART goal setting techniques.  Specifically regarding patient's less desirable eating habits and patterns, we employed the  technique of small changes when Jordan Bush has not been able to fully commit to her prudent nutritional plan.  5. Obesity, current BMI 40.5 Course: Jordan Bush is currently in the action stage of change. As such, her goal is to continue with weight loss efforts.   Nutrition goals: She has agreed to the Category 3 Plan.   Exercise goals: As is.  Behavioral modification strategies: increasing lean protein intake, decreasing simple carbohydrates, and increasing vegetables.  Jordan Bush has agreed to follow-up with our clinic in 4 weeks. She was informed of the importance of frequent follow-up visits to maximize her success with intensive lifestyle modifications for her multiple health conditions.   Objective:   Blood pressure 125/80, pulse (!) 52, temperature 98.3 F (36.8 C), temperature source Oral, height '5\' 3"'$  (1.6 m), weight 228 lb (103.4 kg), SpO2 100 %. Body mass index is 40.39 kg/m.  General: Cooperative, alert, well developed, in no acute distress. HEENT: Conjunctivae and lids unremarkable. Cardiovascular: Regular rhythm.  Lungs: Normal work of breathing. Neurologic: No focal deficits.   Lab Results  Component Value Date   CREATININE 1.01 (H) 05/30/2021   BUN 14 05/30/2021   NA 140 05/30/2021   K 4.0 05/30/2021   CL 104 05/30/2021   CO2 17 (L) 05/30/2021   Lab Results  Component Value Date   ALT 17 05/30/2021   AST 15 05/30/2021   ALKPHOS 71 05/30/2021   BILITOT 0.4 05/30/2021   Lab Results  Component Value Date   HGBA1C 5.3 05/30/2021   HGBA1C 5.5 08/16/2020   HGBA1C 5.2 09/22/2019   HGBA1C 5.6 06/27/2017   Lab Results  Component Value Date   INSULIN 10.3 05/30/2021   INSULIN 17.0 03/10/2020   INSULIN 32.8 (H) 09/22/2019   Lab Results  Component Value Date   TSH 0.940 09/22/2019   Lab Results  Component Value Date   CHOL 170 05/30/2021   HDL 48 05/30/2021   LDLCALC 104 (H) 05/30/2021   TRIG 101 05/30/2021   CHOLHDL 4 12/10/2020   Lab Results  Component  Value Date   VD25OH 45.4 05/30/2021   VD25OH 70.4 08/16/2020   VD25OH 63.1 03/10/2020   Lab Results  Component Value Date   WBC 6.0 08/16/2020   HGB 12.4 08/16/2020   HCT 38.3 08/16/2020   MCV 87 08/16/2020   PLT 258 08/16/2020   Lab Results  Component Value Date   IRON 73 08/16/2020   TIBC 255 08/16/2020   FERRITIN 274 (H) 08/16/2020   Attestation Statements:   Reviewed by clinician on day of visit: allergies, medications, problem list, medical history, surgical history, family history, social history, and previous encounter notes.  Leodis Binet Friedenbach, CMA, am acting as Location manager for [employer], DO.  I have reviewed the above documentation for accuracy and completeness, and I agree with the above. Briscoe Deutscher, DO

## 2021-07-11 ENCOUNTER — Other Ambulatory Visit (INDEPENDENT_AMBULATORY_CARE_PROVIDER_SITE_OTHER): Payer: Self-pay | Admitting: Adult Health

## 2021-07-11 DIAGNOSIS — F3289 Other specified depressive episodes: Secondary | ICD-10-CM

## 2021-07-11 NOTE — Telephone Encounter (Signed)
Last OV with Dr Wallace 

## 2021-07-14 MED ORDER — TOPIRAMATE 50 MG PO TABS: 50.0000 mg | ORAL_TABLET | Freq: Every day | ORAL | 0 refills | Status: DC

## 2021-07-19 ENCOUNTER — Ambulatory Visit (INDEPENDENT_AMBULATORY_CARE_PROVIDER_SITE_OTHER): Payer: BC Managed Care – PPO | Admitting: Family Medicine

## 2021-07-19 DIAGNOSIS — Z20822 Contact with and (suspected) exposure to covid-19: Secondary | ICD-10-CM | POA: Diagnosis not present

## 2021-08-03 ENCOUNTER — Encounter (INDEPENDENT_AMBULATORY_CARE_PROVIDER_SITE_OTHER): Payer: Self-pay | Admitting: Family Medicine

## 2021-08-03 ENCOUNTER — Other Ambulatory Visit: Payer: Self-pay

## 2021-08-03 ENCOUNTER — Ambulatory Visit (INDEPENDENT_AMBULATORY_CARE_PROVIDER_SITE_OTHER): Payer: BC Managed Care – PPO | Admitting: Family Medicine

## 2021-08-03 VITALS — BP 117/81 | HR 76 | Temp 98.2°F | Ht 63.0 in | Wt 230.0 lb

## 2021-08-03 DIAGNOSIS — R7301 Impaired fasting glucose: Secondary | ICD-10-CM

## 2021-08-03 DIAGNOSIS — F3289 Other specified depressive episodes: Secondary | ICD-10-CM

## 2021-08-03 DIAGNOSIS — I10 Essential (primary) hypertension: Secondary | ICD-10-CM | POA: Diagnosis not present

## 2021-08-03 DIAGNOSIS — Z6841 Body Mass Index (BMI) 40.0 and over, adult: Secondary | ICD-10-CM

## 2021-08-03 DIAGNOSIS — Z9189 Other specified personal risk factors, not elsewhere classified: Secondary | ICD-10-CM

## 2021-08-03 MED ORDER — TIRZEPATIDE 2.5 MG/0.5ML ~~LOC~~ SOAJ: 2.5000 mg | SUBCUTANEOUS | 0 refills | Status: DC

## 2021-08-03 MED ORDER — PHENTERMINE HCL 37.5 MG PO TABS: 18.2500 mg | ORAL_TABLET | Freq: Every day | ORAL | 0 refills | Status: DC

## 2021-08-03 MED ORDER — BUPROPION HCL ER (SR) 150 MG PO TB12: ORAL_TABLET | ORAL | 0 refills | Status: DC

## 2021-08-03 NOTE — Progress Notes (Signed)
Chief Complaint:   OBESITY Jordan Bush is here to discuss her progress with her obesity treatment plan along with follow-up of her obesity related diagnoses.   Today's visit was #: 68 Starting weight: 266 lbs Starting date: 09/22/2019 Today's weight: 230 lbs Today's date: 08/03/2021 Weight change since last visit: +2 lbs Total lbs lost to date: 36 lbs Body mass index is 40.74 kg/m.  Total weight loss percentage to date: -13.53%  Current Meal Plan: the Category 3 Plan for 80% of the time.  Current Exercise Plan: Walking/cardio/strength for 60 minutes 3 times per week. Current Anti-Obesity Medications: phentermine 18.75 mg daily. Side effects: None.  Interim History:  Today's bioimpedance results indicate that Jordan Bush has gained 4 pounds of water weight since her last visit.  She says she has had too many carbs.  [id number] - Saxenda - was not approved.  Assessment/Plan:   1. Impaired fasting glucose, with polyphagia Not at goal. Current treatment: phentermine 18.75 mg daily. She will continue to focus on protein-rich, low simple carbohydrate foods. We reviewed the importance of hydration, regular exercise for stress reduction, and restorative sleep.  Plan:  Stop topiramate.  Okay to continue phentermine.  Start Mounjaro 2.5 mg subcutaneously weekly.  - Refill phentermine (ADIPEX-P) 37.5 MG tablet; Take 0.5 tablets (18.75 mg total) by mouth daily before breakfast.  Dispense: 45 tablet; Refill: 0 - Start tirzepatide [facility]) 2.5 MG/0.5ML Pen; Inject 2.5 mg into the skin once a week.  Dispense: 2 mL; Refill: 0  Having again reminded the patient of the "off label" use of Phentermine beyond three consecutive months, and again discussing the risks, benefits, contraindications, and limitations of it's use; given it's role in the successful treatment of obesity thus far and lack of adverse effect, patient has expressed desire and given informed verbal consent to continue use.   I have  consulted the [place] Controlled Substances Registry for this patient, and feel the risk/benefit ratio today is favorable for proceeding with this prescription for a controlled substance. The patient understands monitoring parameters and red flags.   2. Essential hypertension At goal. Medications: Norvasc 5 mg daily.   Plan:  Avoid buying foods that are: processed, frozen, or prepackaged to avoid excess salt. We will watch for signs of hypotension as she continues lifestyle modifications.  BP Readings from Last 3 Encounters:  08/03/21 117/81  06/27/21 125/80  06/10/21 124/74   Lab Results  Component Value Date   CREATININE 1.01 (H) 05/30/2021   3. Other depression, with emotional eating Not optimized. Medication: Wellbutrin 150 mg daily.  Plan:  Continue Wellbutrin.  Will refill today.  Discussed cues and consequences, how thoughts affect eating, model of thoughts, feelings, and behaviors, and strategies for change by focusing on the cue. Discussed cognitive distortions, coping thoughts, and how to change your thoughts.  - Refill buPROPion (WELLBUTRIN SR) 150 MG 12 hr tablet; TAKE 1 TABLET(150 MG) BY MOUTH DAILY  Dispense: 90 tablet; Refill: 0  4. At risk for heart disease Due to Jordan Bush's current state of health and medical condition(s), she is at a higher risk for heart disease.  This puts the patient at much greater risk to subsequently develop cardiopulmonary conditions that can significantly affect patient's quality of life in a negative manner.    At least 8 minutes were spent on counseling Jordan Bush about these concerns today. Evidence-based interventions for health behavior change were utilized today including the discussion of self monitoring techniques, problem-solving barriers, and SMART goal setting techniques.  Specifically, regarding patient's less desirable eating habits and patterns, we employed the technique of small changes when Jordan Bush has not been able to fully commit to her  prudent nutritional plan.  5. Obesity, current BMI 40.7  Course: Jordan Bush is currently in the action stage of change. As such, her goal is to continue with weight loss efforts.   Nutrition goals: She has agreed to the Category 3 Plan.   Exercise goals:  As is.  Behavioral modification strategies: increasing lean protein intake, decreasing simple carbohydrates, increasing vegetables, and increasing water intake.  Jordan Bush has agreed to follow-up with our clinic in 4 weeks. She was informed of the importance of frequent follow-up visits to maximize her success with intensive lifestyle modifications for her multiple health conditions.   Objective:   Blood pressure 117/81, pulse 76, temperature 98.2 F (36.8 C), temperature source Oral, height 5\' 3"  (1.6 m), weight 230 lb (104.3 kg), SpO2 97 %. Body mass index is 40.74 kg/m.  General: Cooperative, alert, well developed, in no acute distress. HEENT: Conjunctivae and lids unremarkable. Cardiovascular: Regular rhythm.  Lungs: Normal work of breathing. Neurologic: No focal deficits.   Lab Results  Component Value Date   CREATININE 1.01 (H) 05/30/2021   BUN 14 05/30/2021   NA 140 05/30/2021   K 4.0 05/30/2021   CL 104 05/30/2021   CO2 17 (L) 05/30/2021   Lab Results  Component Value Date   ALT 17 05/30/2021   AST 15 05/30/2021   ALKPHOS 71 05/30/2021   BILITOT 0.4 05/30/2021   Lab Results  Component Value Date   HGBA1C 5.3 05/30/2021   HGBA1C 5.5 08/16/2020   HGBA1C 5.2 09/22/2019   HGBA1C 5.6 06/27/2017   Lab Results  Component Value Date   INSULIN 10.3 05/30/2021   INSULIN 17.0 03/10/2020   INSULIN 32.8 (H) 09/22/2019   Lab Results  Component Value Date   TSH 0.940 09/22/2019   Lab Results  Component Value Date   CHOL 170 05/30/2021   HDL 48 05/30/2021   LDLCALC 104 (H) 05/30/2021   TRIG 101 05/30/2021   CHOLHDL 4 12/10/2020   Lab Results  Component Value Date   VD25OH 45.4 05/30/2021   VD25OH 70.4  08/16/2020   VD25OH 63.1 03/10/2020   Lab Results  Component Value Date   WBC 6.0 08/16/2020   HGB 12.4 08/16/2020   HCT 38.3 08/16/2020   MCV 87 08/16/2020   PLT 258 08/16/2020   Lab Results  Component Value Date   IRON 73 08/16/2020   TIBC 255 08/16/2020   FERRITIN 274 (H) 08/16/2020   Attestation Statements:   Reviewed by clinician on day of visit: allergies, medications, problem list, medical history, surgical history, family history, social history, and previous encounter notes.  I, Water quality scientist, CMA, am acting as transcriptionist for Briscoe Deutscher, DO  I have reviewed the above documentation for accuracy and completeness, and I agree with the above. Briscoe Deutscher, DO

## 2021-08-08 ENCOUNTER — Telehealth (INDEPENDENT_AMBULATORY_CARE_PROVIDER_SITE_OTHER): Payer: Self-pay | Admitting: Family Medicine

## 2021-08-08 NOTE — Telephone Encounter (Signed)
Prior authorization sent for [facility] 08/03/21. [employer] faxed form that needs to be completed. Form completed and faxed back to [place] on 08/08/21.

## 2021-08-10 ENCOUNTER — Encounter (INDEPENDENT_AMBULATORY_CARE_PROVIDER_SITE_OTHER): Payer: Self-pay

## 2021-08-11 ENCOUNTER — Other Ambulatory Visit (INDEPENDENT_AMBULATORY_CARE_PROVIDER_SITE_OTHER): Payer: Self-pay | Admitting: Adult Health

## 2021-08-11 DIAGNOSIS — F3289 Other specified depressive episodes: Secondary | ICD-10-CM

## 2021-08-11 NOTE — Telephone Encounter (Signed)
Pt last seen by Dr. Wallace.  

## 2021-08-16 ENCOUNTER — Ambulatory Visit (INDEPENDENT_AMBULATORY_CARE_PROVIDER_SITE_OTHER): Payer: BC Managed Care – PPO | Admitting: Family

## 2021-08-16 ENCOUNTER — Other Ambulatory Visit: Payer: Self-pay

## 2021-08-16 DIAGNOSIS — M778 Other enthesopathies, not elsewhere classified: Secondary | ICD-10-CM | POA: Diagnosis not present

## 2021-08-16 DIAGNOSIS — M25521 Pain in right elbow: Secondary | ICD-10-CM | POA: Insufficient documentation

## 2021-08-16 DIAGNOSIS — M7711 Lateral epicondylitis, right elbow: Secondary | ICD-10-CM | POA: Insufficient documentation

## 2021-08-16 NOTE — Progress Notes (Signed)
Acute Office Visit  Subjective:    Patient ID: Jordan Bush, female    DOB: [birth year], 50 y.o.   MRN: [id number]  Chief Complaint  Patient presents with   Elbow Pain    Right     HPI Patient is in today for right elbow pain. Woke up during night last night, took Ibuprofen with some relief. Reports new exercise including using elastic rubberbands and weights.   Outpatient Medications Prior to Visit  Medication Sig Dispense Refill   acetaminophen (TYLENOL) 325 MG tablet Take 650 mg by mouth every 6 (six) hours as needed for mild pain or headache.     albuterol (PROVENTIL HFA;VENTOLIN HFA) 108 (90 Base) MCG/ACT inhaler Inhale 1-2 puffs into the lungs every 6 (six) hours as needed for wheezing or shortness of breath. 1 Inhaler 0   amLODipine (NORVASC) 5 MG tablet TAKE 1 TABLET(5 MG) BY MOUTH DAILY 90 tablet 2   buPROPion (WELLBUTRIN SR) 150 MG 12 hr tablet TAKE 1 TABLET(150 MG) BY MOUTH DAILY 90 tablet 0   Cholecalciferol (VITAMIN D3) 125 MCG (5000 UT) TABS Take by mouth.     Ferrous Sulfate (IRON) 28 MG TABS Take 28 mg by mouth.     fluticasone (FLONASE) 50 MCG/ACT nasal spray Place 2 sprays into both nostrils daily. 16 g 11   fluticasone (FLOVENT HFA) 110 MCG/ACT inhaler Inhale 1 puff into the lungs in the morning and at bedtime. 1 each 2   levocetirizine (XYZAL) 5 MG tablet Take 1 tablet (5 mg total) by mouth daily. 90 tablet 3   montelukast (SINGULAIR) 10 MG tablet TAKE 1 TABLET(10 MG) BY MOUTH AT BEDTIME 90 tablet 2   Multiple Vitamin (MULTIVITAMIN) tablet Take 1 tablet by mouth daily.     phentermine (ADIPEX-P) 37.5 MG tablet Take 0.5 tablets (18.75 mg total) by mouth daily before breakfast. 45 tablet 0   tirzepatide (MOUNJARO) 2.5 MG/0.5ML Pen Inject 2.5 mg into the skin once a week. 2 mL 0   EPINEPHrine (EPIPEN 2-PAK) 0.3 mg/0.3 mL IJ SOAJ injection Inject 0.3 mg into the muscle once for 1 dose. 2 each 1   No facility-administered medications prior to visit.    Allergies   Allergen Reactions   Ace Inhibitors Swelling   Other     Trees, grass, bed bugs Trees, grass, bed bugs    Review of Systems See HPI above.     Objective:    Physical Exam Vitals and nursing note reviewed.  Constitutional:      Appearance: Normal appearance.  Cardiovascular:     Rate and Rhythm: Normal rate and regular rhythm.  Pulmonary:     Effort: Pulmonary effort is normal.     Breath sounds: Normal breath sounds.  Musculoskeletal:     Right elbow: Swelling (mild) present. No effusion. Decreased range of motion. Tenderness present in lateral epicondyle.  Skin:    General: Skin is warm and dry.  Psychiatric:        Mood and Affect: Mood normal.    BP 139/87 (BP Location: Left Arm, Cuff Size: Large)   Pulse 77   Temp 98.6 F (37 C) (Oral)   Resp 12   Ht 5\' 2"  (1.575 m)   Wt 233 lb (105.7 kg)   LMP  (LMP Unknown)   SpO2 100%   BMI 42.62 kg/m  Wt Readings from Last 3 Encounters:  08/16/21 233 lb (105.7 kg)  08/03/21 230 lb (104.3 kg)  06/27/21 228 lb (103.4 kg)  Health Maintenance Due  Topic Date Due   PAP SMEAR-Modifier  05/27/2021   INFLUENZA VACCINE  06/13/2021    There are no preventive care reminders to display for this patient.         Assessment & Plan:   Problem List Items Addressed This Visit       Other   Right elbow pain    Pain on palpation of tendon just distal of elbow. Advised on exercises, ice, Ibuprofen tid and use of elbow/tendon brace, stop all arm exercises using rubber bands and heavy weights for 2 weeks, only do exercises on handouts. Time spent discussing diagnosis, treatment plan including reviewing exercises.         No orders of the defined types were placed in this encounter.    Jeanie Sewer, NP

## 2021-08-16 NOTE — Patient Instructions (Addendum)
Take Ibuprofen up to 600mg  (after eating) every 6 hours, as needed, for pain and swelling. Apply ice to elbow up to 20 minutes 3x/day as able. Perform exercises (do all exercises for 20-30 seconds, repeating 2-3 times) as outlined in handout. Only perform exercises until slight pain, do not overdo causing discomfort. Apply a tendon/elbow strap just below elbow, with cushion on top where soreness is located. Wear this during the day while working with with any activity using your arm.

## 2021-08-16 NOTE — Assessment & Plan Note (Addendum)
Pain on palpation of tendon just distal of elbow. Advised on exercises, ice, Ibuprofen tid and use of elbow/tendon brace, stop all arm exercises using rubber bands and heavy weights for 2 weeks, only do exercises on handouts. Time spent discussing diagnosis, treatment plan including reviewing exercises.

## 2021-08-23 ENCOUNTER — Ambulatory Visit: Payer: BC Managed Care – PPO | Admitting: Family Medicine

## 2021-08-24 ENCOUNTER — Other Ambulatory Visit: Payer: Self-pay

## 2021-08-24 ENCOUNTER — Ambulatory Visit (INDEPENDENT_AMBULATORY_CARE_PROVIDER_SITE_OTHER): Payer: BC Managed Care – PPO | Admitting: Family Medicine

## 2021-08-24 ENCOUNTER — Encounter (INDEPENDENT_AMBULATORY_CARE_PROVIDER_SITE_OTHER): Payer: Self-pay | Admitting: Family Medicine

## 2021-08-24 VITALS — BP 121/82 | HR 80 | Temp 98.3°F | Ht 63.0 in | Wt 229.0 lb

## 2021-08-24 DIAGNOSIS — E7800 Pure hypercholesterolemia, unspecified: Secondary | ICD-10-CM | POA: Diagnosis not present

## 2021-08-24 DIAGNOSIS — Z6841 Body Mass Index (BMI) 40.0 and over, adult: Secondary | ICD-10-CM

## 2021-08-24 DIAGNOSIS — Z9189 Other specified personal risk factors, not elsewhere classified: Secondary | ICD-10-CM | POA: Diagnosis not present

## 2021-08-24 DIAGNOSIS — I10 Essential (primary) hypertension: Secondary | ICD-10-CM

## 2021-08-24 DIAGNOSIS — E88819 Insulin resistance, unspecified: Secondary | ICD-10-CM

## 2021-08-24 DIAGNOSIS — E8881 Metabolic syndrome: Secondary | ICD-10-CM

## 2021-08-25 NOTE — Progress Notes (Signed)
Chief Complaint:   OBESITY Jordan Bush is here to discuss her progress with her obesity treatment plan along with follow-up of her obesity related diagnoses.   Today's visit was #: 28 Starting weight: 266 lbs Starting date: 09/22/2019 Today's weight: 229 lbs Today's date: 08/24/2021 Weight change since last visit: 1 lb Total lbs lost to date: 37 lbs Body mass index is 40.57 kg/m.  Total weight loss percentage to date: -13.91%  Current Meal Plan: the Category 3 Plan for 75% of the time.  Current Exercise Plan: Walking 3.5 miles 3 times per week. Current Anti-Obesity Medications: phentermine 18.75 mg daily and Mounjaro 2.5 mg subcutaneously weekly (not yet started). Side effects: None.  Interim History:  Jordan Bush has not started [facility] yet due to pharmacy issues.  She says she will be picking it up today.  Assessment/Plan:   1. Insulin resistance, with polyphagia At goal. Goal is HgbA1c < 5.7, fasting insulin closer to 5.  Medication: Mounjaro 2.5 mg subcutaneously weekly (not started yet) and phentermine 18.75 mg daily.   Plan:  Start Mounjaro 2.5 mg subcutaneously weekly and continue phentermine 18.75 mg daily.  She will continue to focus on protein-rich, low simple carbohydrate foods. We reviewed the importance of hydration, regular exercise for stress reduction, and restorative sleep.   Lab Results  Component Value Date   HGBA1C 5.3 05/30/2021   Lab Results  Component Value Date   INSULIN 10.3 05/30/2021   INSULIN 17.0 03/10/2020   INSULIN 32.8 (H) 09/22/2019   2. Pure hypercholesterolemia Course: Not optimized. Lipid-lowering medications: None.   Plan: Dietary changes: Increase soluble fiber, decrease simple carbohydrates, decrease saturated fat. Exercise changes: Moderate to vigorous-intensity aerobic activity 150 minutes per week or as tolerated. We will continue to monitor along with PCP/specialists as it pertains to her weight loss journey.  Lab Results   Component Value Date   CHOL 170 05/30/2021   HDL 48 05/30/2021   LDLCALC 104 (H) 05/30/2021   TRIG 101 05/30/2021   CHOLHDL 4 12/10/2020   Lab Results  Component Value Date   ALT 17 05/30/2021   AST 15 05/30/2021   ALKPHOS 71 05/30/2021   BILITOT 0.4 05/30/2021   The 10-year ASCVD risk score (Arnett DK, et al., 2019) is: 2.7%   Values used to calculate the score:     Age: 50 years     Sex: Female     Is Non-Hispanic African American: Yes     Diabetic: No     Tobacco smoker: No     Systolic Blood Pressure: 742 mmHg     Is BP treated: Yes     HDL Cholesterol: 48 mg/dL     Total Cholesterol: 170 mg/dL  3. Essential hypertension At goal. Medications: Norvasc 5 mg daily.   Plan: Avoid buying foods that are: processed, frozen, or prepackaged to avoid excess salt. We will watch for signs of hypotension as she continues lifestyle modifications.  BP Readings from Last 3 Encounters:  08/24/21 121/82  08/16/21 139/87  08/03/21 117/81   Lab Results  Component Value Date   CREATININE 1.01 (H) 05/30/2021   4. Obesity, current BMI 40.6  Course: Jordan Bush is currently in the action stage of change. As such, her goal is to continue with weight loss efforts.   Nutrition goals: She has agreed to the Category 3 Plan.   Exercise goals:  As is.  Behavioral modification strategies: increasing lean protein intake, decreasing simple carbohydrates, increasing vegetables, and increasing water intake.  [name] has agreed to follow-up with our clinic in 4 weeks. She was informed of the importance of frequent follow-up visits to maximize her success with intensive lifestyle modifications for her multiple health conditions.   Objective:   Blood pressure 121/82, pulse 80, temperature 98.3 F (36.8 C), temperature source Oral, height 5\' 3"  (1.6 m), weight 229 lb (103.9 kg), SpO2 100 %. Body mass index is 40.57 kg/m.  General: Cooperative, alert, well developed, in no acute distress. HEENT:  Conjunctivae and lids unremarkable. Cardiovascular: Regular rhythm.  Lungs: Normal work of breathing. Neurologic: No focal deficits.   Lab Results  Component Value Date   CREATININE 1.01 (H) 05/30/2021   BUN 14 05/30/2021   NA 140 05/30/2021   K 4.0 05/30/2021   CL 104 05/30/2021   CO2 17 (L) 05/30/2021   Lab Results  Component Value Date   ALT 17 05/30/2021   AST 15 05/30/2021   ALKPHOS 71 05/30/2021   BILITOT 0.4 05/30/2021   Lab Results  Component Value Date   HGBA1C 5.3 05/30/2021   HGBA1C 5.5 08/16/2020   HGBA1C 5.2 09/22/2019   HGBA1C 5.6 06/27/2017   Lab Results  Component Value Date   INSULIN 10.3 05/30/2021   INSULIN 17.0 03/10/2020   INSULIN 32.8 (H) 09/22/2019   Lab Results  Component Value Date   TSH 0.940 09/22/2019   Lab Results  Component Value Date   CHOL 170 05/30/2021   HDL 48 05/30/2021   LDLCALC 104 (H) 05/30/2021   TRIG 101 05/30/2021   CHOLHDL 4 12/10/2020   Lab Results  Component Value Date   VD25OH 45.4 05/30/2021   VD25OH 70.4 08/16/2020   VD25OH 63.1 03/10/2020   Lab Results  Component Value Date   WBC 6.0 08/16/2020   HGB 12.4 08/16/2020   HCT 38.3 08/16/2020   MCV 87 08/16/2020   PLT 258 08/16/2020   Lab Results  Component Value Date   IRON 73 08/16/2020   TIBC 255 08/16/2020   FERRITIN 274 (H) 08/16/2020   Attestation Statements:   Reviewed by clinician on day of visit: allergies, medications, problem list, medical history, surgical history, family history, social history, and previous encounter notes.  I, Water quality scientist, CMA, am acting as transcriptionist for Briscoe Deutscher, DO  I have reviewed the above documentation for accuracy and completeness, and I agree with the above. -  Briscoe Deutscher, DO, MS, FAAFP, DABOM - Family and Bariatric Medicine.

## 2021-09-05 ENCOUNTER — Other Ambulatory Visit: Payer: Self-pay | Admitting: Family Medicine

## 2021-09-05 DIAGNOSIS — I10 Essential (primary) hypertension: Secondary | ICD-10-CM

## 2021-09-21 ENCOUNTER — Encounter (INDEPENDENT_AMBULATORY_CARE_PROVIDER_SITE_OTHER): Payer: Self-pay | Admitting: Family Medicine

## 2021-09-21 ENCOUNTER — Other Ambulatory Visit: Payer: Self-pay

## 2021-09-21 ENCOUNTER — Ambulatory Visit (INDEPENDENT_AMBULATORY_CARE_PROVIDER_SITE_OTHER): Payer: BC Managed Care – PPO | Admitting: Family Medicine

## 2021-09-21 VITALS — BP 135/88 | HR 78 | Temp 98.0°F | Ht 63.0 in | Wt 225.0 lb

## 2021-09-21 DIAGNOSIS — M778 Other enthesopathies, not elsewhere classified: Secondary | ICD-10-CM

## 2021-09-21 DIAGNOSIS — Z6841 Body Mass Index (BMI) 40.0 and over, adult: Secondary | ICD-10-CM | POA: Diagnosis not present

## 2021-09-21 DIAGNOSIS — R7301 Impaired fasting glucose: Secondary | ICD-10-CM | POA: Diagnosis not present

## 2021-09-21 MED ORDER — TIRZEPATIDE 5 MG/0.5ML ~~LOC~~ SOAJ
5.0000 mg | SUBCUTANEOUS | 0 refills | Status: DC
Start: 2021-09-21 — End: 2021-10-19

## 2021-09-22 NOTE — Progress Notes (Signed)
Chief Complaint:   OBESITY Jordan Bush is here to discuss her progress with her obesity treatment plan along with follow-up of her obesity related diagnoses. See Medical Weight Management Flowsheet for complete bioelectrical impedance results.  Today's visit was #: 62 Starting weight: 266 lbs Starting date: 09/22/2019 Weight change since last visit: 4 lbs Total lbs lost to date: 41 lbs Total weight loss percentage to date: -15.41%  Nutrition Plan: Category 3 Meal plan for 75% of the time. Activity: Walking 2-3 miles for 60 minutes 2-3 times per week. Anti-obesity medications: Mounjaro 2.5 mg subcutaneously weekly and phentermine 18.75 mg daily. Reported side effects: None.  Interim History: Jordan Bush is down 41 pounds.  She is on Mounjaro 2.5 mg and denies side effects.  CPE in January 2023 - okay labs with PCP.  Assessment/Plan:   1. Impaired fasting glucose, with polyphagia Improving. Increase Mounjaro to 5 mg subcutaneously weekly, as per below. She will continue to focus on protein-rich, low simple carbohydrate foods. We reviewed the importance of hydration, regular exercise for stress reduction, and restorative sleep.   - Increase tirzepatide (MOUNJARO) 5 MG/0.5ML Pen; Inject 5 mg into the skin once a week.  Dispense: 2 mL; Refill: 0  2. Right elbow tendonitis Chronic and worsening.  She has already tried stretches, exercises, topical and oral antiinflammatories, and an elbow strap.  Plan:  Will place referral to Sports Medicine.  - Ambulatory referral to Sports Medicine  3. Obesity, current BMI 40.0  Course: Jordan Bush is currently in the action stage of change. As such, her goal is to continue with weight loss efforts.   Nutrition goals: She has agreed to the Category 3 Plan.   Exercise goals:  As is.  Behavioral modification strategies: increasing lean protein intake, decreasing simple carbohydrates, increasing vegetables, and increasing water intake.  Jordan Bush has  agreed to follow-up with our clinic in 4 weeks. She was informed of the importance of frequent follow-up visits to maximize her success with intensive lifestyle modifications for her multiple health conditions.   Objective:   Blood pressure 135/88, pulse 78, temperature 98 F (36.7 C), temperature source Oral, height 5\' 3"  (1.6 m), weight 225 lb (102.1 kg), SpO2 99 %. Body mass index is 39.86 kg/m.  General: Cooperative, alert, well developed, in no acute distress. HEENT: Conjunctivae and lids unremarkable. Cardiovascular: Regular rhythm.  Lungs: Normal work of breathing. Neurologic: No focal deficits.   Lab Results  Component Value Date   CREATININE 1.01 (H) 05/30/2021   BUN 14 05/30/2021   NA 140 05/30/2021   K 4.0 05/30/2021   CL 104 05/30/2021   CO2 17 (L) 05/30/2021   Lab Results  Component Value Date   ALT 17 05/30/2021   AST 15 05/30/2021   ALKPHOS 71 05/30/2021   BILITOT 0.4 05/30/2021   Lab Results  Component Value Date   HGBA1C 5.3 05/30/2021   HGBA1C 5.5 08/16/2020   HGBA1C 5.2 09/22/2019   HGBA1C 5.6 06/27/2017   Lab Results  Component Value Date   INSULIN 10.3 05/30/2021   INSULIN 17.0 03/10/2020   INSULIN 32.8 (H) 09/22/2019   Lab Results  Component Value Date   TSH 0.940 09/22/2019   Lab Results  Component Value Date   CHOL 170 05/30/2021   HDL 48 05/30/2021   LDLCALC 104 (H) 05/30/2021   TRIG 101 05/30/2021   CHOLHDL 4 12/10/2020   Lab Results  Component Value Date   VD25OH 45.4 05/30/2021   VD25OH 70.4 08/16/2020  VD25OH 63.1 03/10/2020   Lab Results  Component Value Date   WBC 6.0 08/16/2020   HGB 12.4 08/16/2020   HCT 38.3 08/16/2020   MCV 87 08/16/2020   PLT 258 08/16/2020   Lab Results  Component Value Date   IRON 73 08/16/2020   TIBC 255 08/16/2020   FERRITIN 274 (H) 08/16/2020   Attestation Statements:   Reviewed by clinician on day of visit: allergies, medications, problem list, medical history, surgical history,  family history, social history, and previous encounter notes.  I, Water quality scientist, CMA, am acting as transcriptionist for Briscoe Deutscher, DO  I have reviewed the above documentation for accuracy and completeness, and I agree with the above. -  Briscoe Deutscher, DO, MS, FAAFP, DABOM - Family and Bariatric Medicine.

## 2021-09-27 NOTE — Progress Notes (Signed)
    Subjective:    CC: R elbow pain  I, [name] Weber, LAT, ATC, am serving as scribe for Dr. Lynne Leader.  HPI: Pt is a 50 y/o female presenting w/ chronic R elbow pain ongoing for 1-2 months .  She locates her pain to the entire R arm, esp the lateral aspect. Pt is R-hand dominate. 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  Neck pain: no Radiating pain: yes R elbow swelling: no UE numbness/tingling: yes- sometimes, into fingers Aggravating factors: lifting heavy objects, typing Treatments tried: Stretching; oral NSAIDs; topical pain-relieving gel/cream; elbow counterforce strap; compression sleeve  Pertinent review of Systems: No fevers or chills  Relevant historical information: History of adenocarcinoma in situ uterine cervix.  History of obesity.  Hypertension.   Objective:    Vitals:   09/28/21 0814  BP: (!) 158/102  Pulse: 76  SpO2: 99%   General: Well Developed, well nourished, and in no acute distress.   MSK:  C-spine: Normal-appearing nontender normal cervical motion negative Spurling's test. Right elbow normal-appearing Normal motion. Normal strength. Tender palpation lateral epicondyle. Pain with resisted wrist and finger extension. No pain with resisted wrist flexion or wrist pronation or supination. Pulses capillary fill and sensation are intact distally.  Lab and Radiology Results  Diagnostic Limited MSK Ultrasound of: Right elbow Epicondyle visualized. Calcific change at origin site of common extensor tendon consistent with chronic calcific tendinopathy. Small hypoechoic change deep at common extensor tendon origin consistent with small tear. Bony structures otherwise normal-appearing No joint effusion. Impression: Lateral epicondylitis with evidence of chronic calcific tendinitis and possible small tear deeper.     Impression and Recommendations:    Assessment and Plan: 50 y.o. female with right lateral elbow pain  consistent with lateral epicondylitis both clinically and on ultrasound.  Plan to treat with home exercise program taught in clinic today by ATC, and with nitroglycerin patch protocol.  Modified how she is using the counterforce brace.  Also recommend a compression sleeve potentially.  Plan to check back in about 1 month.  If not better would consider injection.  Generally try to avoid steroid injections for this problem if able.Marland Kitchen  PDMP not reviewed this encounter. Orders Placed This Encounter  Procedures   [name] LIMITED JOINT SPACE STRUCTURES LOW RIGHT(NO LINKED CHARGES)    Standing Status:   Future    Number of Occurrences:   1    Standing Expiration Date:   03/28/2022    Order Specific Question:   Reason for Exam (SYMPTOM  OR DIAGNOSIS REQUIRED)    Answer:   right arm pain    Order Specific Question:   Preferred imaging location?    Answer:   [facility]   Meds ordered this encounter  Medications   nitroGLYCERIN (NITRODUR - DOSED IN MG/24 HR) 0.2 mg/hr patch    Sig: Place 1 patch (0.2 mg total) onto the skin daily.    Dispense:  30 patch    Refill:  1    Nitroglycerin Protocol Apply 1/4 nitroglycerin patch to affected area daily Change position of patch within the affected area every 24 hours    Discussed warning signs or symptoms. Please see discharge instructions. Patient expresses understanding.   The above documentation has been reviewed and is accurate and complete Lynne Leader, M.D.

## 2021-09-28 ENCOUNTER — Ambulatory Visit: Payer: Self-pay

## 2021-09-28 ENCOUNTER — Ambulatory Visit: Payer: BC Managed Care – PPO | Admitting: Family Medicine

## 2021-09-28 ENCOUNTER — Other Ambulatory Visit: Payer: Self-pay

## 2021-09-28 VITALS — BP 158/102 | HR 76 | Ht 63.0 in | Wt 227.0 lb

## 2021-09-28 DIAGNOSIS — M7711 Lateral epicondylitis, right elbow: Secondary | ICD-10-CM | POA: Diagnosis not present

## 2021-09-28 DIAGNOSIS — M25521 Pain in right elbow: Secondary | ICD-10-CM

## 2021-09-28 MED ORDER — NITROGLYCERIN 0.2 MG/HR TD PT24: 0.2000 mg | MEDICATED_PATCH | Freq: Every day | TRANSDERMAL | 1 refills | Status: DC

## 2021-09-28 NOTE — Patient Instructions (Addendum)
Thank you for coming in today.   Please complete the exercises that the athletic trainer went over with you:  View at [email or web address]-exercise-code.com using code: [email or web address]  Nitroglycerin Protocol Apply 1/4 nitroglycerin patch to affected area daily. Change position of patch within the affected area every 24 hours. You may experience a headache during the first 1-2 weeks of using the patch, these should subside. If you experience headaches after beginning nitroglycerin patch treatment, you may take your preferred over the counter pain reliever. Another side effect of the nitroglycerin patch is skin irritation or rash related to patch adhesive. Please notify our office if you develop more severe headaches or rash, and stop the patch. Tendon healing with nitroglycerin patch may require 12 to 24 weeks depending on the extent of injury. Men should not use if taking Viagra, Cialis, or Levitra.  Do not use if you have migraines or rosacea.    I recommend you obtained a compression sleeve to help with your joint problems. There are many options on the market however I recommend obtaining a full elbow Body Helix compression sleeve.  You can find information (including how to appropriate measure yourself for sizing) can be found at www.Body http://www.lambert.com/.  Many of these products are health savings account (HSA) eligible.  You can use the compression sleeve at any time throughout the day but is most important to use while being active as well as for 2 hours post-activity.   It is appropriate to ice following activity with the compression sleeve in place.   Recheck back in 1 month

## 2021-10-10 ENCOUNTER — Ambulatory Visit: Payer: BC Managed Care – PPO | Admitting: Family Medicine

## 2021-10-10 ENCOUNTER — Telehealth: Payer: Self-pay | Admitting: Family Medicine

## 2021-10-10 ENCOUNTER — Other Ambulatory Visit: Payer: Self-pay

## 2021-10-10 ENCOUNTER — Encounter: Payer: Self-pay | Admitting: Family Medicine

## 2021-10-10 VITALS — BP 130/84 | HR 84 | Temp 98.8°F | Resp 18 | Ht 63.0 in | Wt 221.2 lb

## 2021-10-10 DIAGNOSIS — I10 Essential (primary) hypertension: Secondary | ICD-10-CM

## 2021-10-10 DIAGNOSIS — R079 Chest pain, unspecified: Secondary | ICD-10-CM | POA: Diagnosis not present

## 2021-10-10 DIAGNOSIS — R519 Headache, unspecified: Secondary | ICD-10-CM | POA: Diagnosis not present

## 2021-10-10 LAB — CBC WITH DIFFERENTIAL/PLATELET
Basophils Absolute: 0 10*3/uL (ref 0.0–0.1)
Basophils Relative: 0.7 % (ref 0.0–3.0)
Eosinophils Absolute: 0.2 10*3/uL (ref 0.0–0.7)
Eosinophils Relative: 3.8 % (ref 0.0–5.0)
HCT: 36 % (ref 36.0–46.0)
Hemoglobin: 12.1 g/dL (ref 12.0–15.0)
Lymphocytes Relative: 39.6 % (ref 12.0–46.0)
Lymphs Abs: 2.5 10*3/uL (ref 0.7–4.0)
MCHC: 33.5 g/dL (ref 30.0–36.0)
MCV: 86.5 fl (ref 78.0–100.0)
Monocytes Absolute: 0.4 10*3/uL (ref 0.1–1.0)
Monocytes Relative: 6.8 % (ref 3.0–12.0)
Neutro Abs: 3.2 10*3/uL (ref 1.4–7.7)
Neutrophils Relative %: 49.1 % (ref 43.0–77.0)
Platelets: 203 10*3/uL (ref 150.0–400.0)
RBC: 4.16 Mil/uL (ref 3.87–5.11)
RDW: 13.8 % (ref 11.5–15.5)
WBC: 6.4 10*3/uL (ref 4.0–10.5)

## 2021-10-10 LAB — COMPREHENSIVE METABOLIC PANEL
ALT: 15 U/L (ref 0–35)
AST: 13 U/L (ref 0–37)
Albumin: 4.3 g/dL (ref 3.5–5.2)
Alkaline Phosphatase: 62 U/L (ref 39–117)
BUN: 17 mg/dL (ref 6–23)
CO2: 27 mEq/L (ref 19–32)
Calcium: 9.3 mg/dL (ref 8.4–10.5)
Chloride: 103 mEq/L (ref 96–112)
Creatinine, Ser: 1.16 mg/dL (ref 0.40–1.20)
GFR: 55.39 mL/min — ABNORMAL LOW (ref >60.00)
Glucose, Bld: 78 mg/dL (ref 70–99)
Potassium: 4.1 mEq/L (ref 3.5–5.1)
Sodium: 139 mEq/L (ref 135–145)
Total Bilirubin: 0.5 mg/dL (ref 0.2–1.2)
Total Protein: 7.1 g/dL (ref 6.0–8.3)

## 2021-10-10 LAB — LIPID PANEL
Cholesterol: 178 mg/dL (ref 0–200)
HDL: 42.1 mg/dL (ref >39.00)
LDL Cholesterol: 115 mg/dL — ABNORMAL HIGH (ref 0–99)
NonHDL: 135.91
Total CHOL/HDL Ratio: 4
Triglycerides: 105 mg/dL (ref 0.0–149.0)
VLDL: 21 mg/dL (ref 0.0–40.0)

## 2021-10-10 LAB — SEDIMENTATION RATE: Sed Rate: 31 mm/hr — ABNORMAL HIGH (ref 0–20)

## 2021-10-10 MED ORDER — HYDROCHLOROTHIAZIDE 25 MG PO TABS: 25.0000 mg | ORAL_TABLET | Freq: Every day | ORAL | 3 refills | Status: DC

## 2021-10-10 NOTE — Telephone Encounter (Signed)
Pt stated she has elevated bp and a headache and overall just not feeling well. She was triaged for further evaluation.

## 2021-10-10 NOTE — Telephone Encounter (Signed)
Nurse Assessment Nurse: Micki Riley, RN, Domenick Gong Date/Time (Eastern Time): 10/10/2021 9:04:52 AM Confirm and document reason for call. If symptomatic, describe symptoms. ---Caller states her bp has been elevated, 142/103 most recent. States she takes bp medication (not missed any doses). States she has a headache 6/10 now, has taken tylenol, & does 'not feel well'. States she also has right arm tendonitis ('not bothering' her now), md gave her medication to put on her elbow which gave her a bad headache. Denies any other symptoms. Does the patient have any new or worsening symptoms? ---Yes Will a triage be completed? ---Yes Related visit to physician within the last 2 weeks? ---N/A Does the PT have any chronic conditions? (i.e. diabetes, asthma, this includes High risk factors for pregnancy, etc.) ---Unknown Is the patient pregnant or possibly pregnant? (Ask all females between the ages of 37-55) ---No Is this a behavioral health or substance abuse call? ---No Guidelines Guideline Title Affirmed Question Affirmed Notes Nurse Date/Time (Eastern Time) Blood Pressure - High Systolic BP >= 697 OR Diastolic >= 948 Cazares, RN, Domenick Gong 10/10/2021 9:05:18 AM PLEASE NOTE: All timestamps contained within this report are represented as [place] Standard Time. CONFIDENTIALTY NOTICE: This fax transmission is intended only for the addressee. It contains information that is legally privileged, confidential or otherwise protected from use or disclosure. If you are not the intended recipient, you are strictly prohibited from reviewing, disclosing, copying using or disseminating any of this information or taking any action in reliance on or regarding this information. If you have received this fax in error, please notify us immediately by telephone so that we can arrange for its return to [name]. Phone: [phone], Toll-Free: [phone], Fax: [phone] Page: 2 of 2 Call Id:  [id number] [place]. Time Eilene Ghazi Time) Disposition Final User 10/10/2021 9:03:02 AM Send to Urgent Queue Josephine Cables 10/10/2021 9:09:13 AM See PCP within 24 Hours Yes Cazares, RN, Domenick Gong Disposition Overriden: SEE PCP WITHIN 3 DAYS Override Reason: Patient's symptoms need a higher level of care Caller Disagree/Comply Comply Caller Understands Yes PreDisposition InappropriateToAsk Care Advice Given Per Guideline SEE PCP WITHIN 24 HOURS: CALL BACK IF: * Weakness or numbness of the face, arm or leg on one side of the body occurs * Difficulty walking, difficulty talking, or severe headache occurs * Chest pain or difficulty breathing occurs * You become worse CARE ADVICE given per High Blood Pressure (Adult) guideline. Comments User: Domenick Gong, Micki Riley, RN Date/Time Eilene Ghazi Time): 10/10/2021 9:10:55 AM Transferred caller to main office line [phone] for her to make an appt. Referrals REFERRED TO PCP OFFICE

## 2021-10-10 NOTE — Assessment & Plan Note (Signed)
ekg nsr
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
## 2021-10-10 NOTE — Telephone Encounter (Signed)
Appt scheduled today 10/10/21

## 2021-10-10 NOTE — Patient Instructions (Signed)

## 2021-10-10 NOTE — Assessment & Plan Note (Signed)
Due to bp? Add hctz and f/u pcp 2 weeks

## 2021-10-10 NOTE — Assessment & Plan Note (Signed)
After reviewing chart and previous drs app and vs---  Will add hctz 25 mg daily and f/u 2 weeks or sooner prn

## 2021-10-19 ENCOUNTER — Ambulatory Visit (INDEPENDENT_AMBULATORY_CARE_PROVIDER_SITE_OTHER): Payer: BC Managed Care – PPO | Admitting: Family Medicine

## 2021-10-19 ENCOUNTER — Other Ambulatory Visit: Payer: Self-pay

## 2021-10-19 ENCOUNTER — Encounter (INDEPENDENT_AMBULATORY_CARE_PROVIDER_SITE_OTHER): Payer: Self-pay | Admitting: Family Medicine

## 2021-10-19 VITALS — BP 118/84 | HR 88 | Temp 98.1°F | Ht 63.0 in | Wt 213.0 lb

## 2021-10-19 DIAGNOSIS — Z6841 Body Mass Index (BMI) 40.0 and over, adult: Secondary | ICD-10-CM

## 2021-10-19 DIAGNOSIS — F3289 Other specified depressive episodes: Secondary | ICD-10-CM

## 2021-10-19 DIAGNOSIS — M25521 Pain in right elbow: Secondary | ICD-10-CM | POA: Diagnosis not present

## 2021-10-19 DIAGNOSIS — R7301 Impaired fasting glucose: Secondary | ICD-10-CM

## 2021-10-19 DIAGNOSIS — I10 Essential (primary) hypertension: Secondary | ICD-10-CM

## 2021-10-19 MED ORDER — TIRZEPATIDE 5 MG/0.5ML ~~LOC~~ SOAJ: 5.0000 mg | SUBCUTANEOUS | 0 refills | Status: DC

## 2021-10-19 MED ORDER — BUPROPION HCL ER (SR) 150 MG PO TB12: ORAL_TABLET | ORAL | 0 refills | Status: DC

## 2021-10-19 NOTE — Progress Notes (Signed)
Chief Complaint:   OBESITY Jordan Bush is here to discuss her progress with her obesity treatment plan along with follow-up of her obesity related diagnoses. See Medical Weight Management Flowsheet for complete bioelectrical impedance results.  Today's visit was #: 40 Starting weight: 266 lbs Starting date: 09/22/2019 Weight change since last visit: 12 lbs Total lbs lost to date: 53 lbs Total weight loss percentage to date: -19.92%  Nutrition Plan: Category 3 Plan for 50% of the time.  Activity: Walking 3 miles 3 times per week. Anti-obesity medications: Mounjaro 5 mg subcutaneously weekly and phentermine 18.75 mg daily. Reported side effects: None.  Interim History: Jordan Bush had an episode of diastolic hypertension, palpitations.  She started HCTZ and improved.  At last visit, Mounjaro was increased from 2.5-5 mg.  She says she has decreased appetite, so she may have become slightly dehydrated.  Was also not sleeping well due to right elbow pain.  Assessment/Plan:   1. Impaired fasting glucose, with polyphagia Controlled. Current treatment: Mounjaro 5 mg subcutaneously weekly.    Plan:  Continue Mounjaro 5 mg subcutaneously weekly.  Will refill today (or 2.5 mg if 5 mg not available).  She will continue to focus on protein-rich, low simple carbohydrate foods. We reviewed the importance of hydration, regular exercise for stress reduction, and restorative sleep.  - Refill tirzepatide (MOUNJARO) 5 MG/0.5ML Pen; Inject 5 mg into the skin once a week.  Dispense: 2 mL; Refill: 0  2. Right elbow pain Consider gabapentin 100 mg at bedtime.  3. Essential hypertension Controlled today. Medications: Norvasc 5 mg daily, HCTZ 25 mg daily.   Plan:  Discontinue phentermine. Avoid buying foods that are: processed, frozen, or prepackaged to avoid excess salt. We will watch for signs of hypotension as she continues lifestyle modifications.  BP Readings from Last 3 Encounters:  10/19/21 118/84   10/10/21 130/84  09/28/21 (!) 158/102   Lab Results  Component Value Date   CREATININE 1.16 10/10/2021   4. Other depression, with emotional eating Controlled. Medication: Wellbutrin 150 mg daily.   Plan:  Continue Wellbutrin.  Will refill today. Discussed cues and consequences, how thoughts affect eating, model of thoughts, feelings, and behaviors, and strategies for change by focusing on the cue. Discussed cognitive distortions, coping thoughts, and how to change your thoughts.  - Refill buPROPion (WELLBUTRIN SR) 150 MG 12 hr tablet; TAKE 1 TABLET(150 MG) BY MOUTH DAILY  Dispense: 90 tablet; Refill: 0  5. Obesity, current BMI 37.9  Course: Jordan Bush is currently in the action stage of change. As such, her goal is to continue with weight loss efforts.   Nutrition goals: She has agreed to the Category 3 Plan.   Exercise goals:  As is.  Behavioral modification strategies: increasing lean protein intake, decreasing simple carbohydrates, increasing vegetables, increasing water intake, and decreasing liquid calories.  Jordan Bush has agreed to follow-up with our clinic in 4 weeks. She was informed of the importance of frequent follow-up visits to maximize her success with intensive lifestyle modifications for her multiple health conditions.   Objective:   Blood pressure 118/84, pulse 88, temperature 98.1 F (36.7 C), temperature source Oral, height 5\' 3"  (1.6 m), weight 213 lb (96.6 kg), SpO2 98 %. Body mass index is 37.73 kg/m.  General: Cooperative, alert, well developed, in no acute distress. HEENT: Conjunctivae and lids unremarkable. Cardiovascular: Regular rhythm.  Lungs: Normal work of breathing. Neurologic: No focal deficits.   Lab Results  Component Value Date   CREATININE 1.16 10/10/2021  BUN 17 10/10/2021   NA 139 10/10/2021   K 4.1 10/10/2021   CL 103 10/10/2021   CO2 27 10/10/2021   Lab Results  Component Value Date   ALT 15 10/10/2021   AST 13 10/10/2021    ALKPHOS 62 10/10/2021   BILITOT 0.5 10/10/2021   Lab Results  Component Value Date   HGBA1C 5.3 05/30/2021   HGBA1C 5.5 08/16/2020   HGBA1C 5.2 09/22/2019   HGBA1C 5.6 06/27/2017   Lab Results  Component Value Date   INSULIN 10.3 05/30/2021   INSULIN 17.0 03/10/2020   INSULIN 32.8 (H) 09/22/2019   Lab Results  Component Value Date   TSH 0.940 09/22/2019   Lab Results  Component Value Date   CHOL 178 10/10/2021   HDL 42.10 10/10/2021   LDLCALC 115 (H) 10/10/2021   TRIG 105.0 10/10/2021   CHOLHDL 4 10/10/2021   Lab Results  Component Value Date   VD25OH 45.4 05/30/2021   VD25OH 70.4 08/16/2020   VD25OH 63.1 03/10/2020   Lab Results  Component Value Date   WBC 6.4 10/10/2021   HGB 12.1 10/10/2021   HCT 36.0 10/10/2021   MCV 86.5 10/10/2021   PLT 203.0 10/10/2021   Lab Results  Component Value Date   IRON 73 08/16/2020   TIBC 255 08/16/2020   FERRITIN 274 (H) 08/16/2020   Attestation Statements:   Reviewed by clinician on day of visit: allergies, medications, problem list, medical history, surgical history, family history, social history, and previous encounter notes.  I, Water quality scientist, CMA, am acting as transcriptionist for Briscoe Deutscher, DO  I have reviewed the above documentation for accuracy and completeness, and I agree with the above. -  Briscoe Deutscher, DO, MS, FAAFP, DABOM - Family and Bariatric Medicine.

## 2021-10-24 ENCOUNTER — Ambulatory Visit: Payer: BC Managed Care – PPO | Admitting: Family Medicine

## 2021-10-24 ENCOUNTER — Encounter: Payer: Self-pay | Admitting: Family Medicine

## 2021-10-24 VITALS — BP 120/78 | HR 94 | Temp 98.4°F | Ht 63.0 in | Wt 217.1 lb

## 2021-10-24 DIAGNOSIS — I10 Essential (primary) hypertension: Secondary | ICD-10-CM | POA: Diagnosis not present

## 2021-10-24 LAB — BASIC METABOLIC PANEL
BUN: 21 mg/dL (ref 6–23)
CO2: 27 mEq/L (ref 19–32)
Calcium: 9.5 mg/dL (ref 8.4–10.5)
Chloride: 103 mEq/L (ref 96–112)
Creatinine, Ser: 1.25 mg/dL — ABNORMAL HIGH (ref 0.40–1.20)
GFR: 50.63 mL/min — ABNORMAL LOW (ref >60.00)
Glucose, Bld: 83 mg/dL (ref 70–99)
Potassium: 3.7 mEq/L (ref 3.5–5.1)
Sodium: 139 mEq/L (ref 135–145)

## 2021-10-24 NOTE — Patient Instructions (Addendum)
Keep the diet clean and stay active.  Give [name] 2-3 business days to get the results of your labs back.   Use OTC cortisone cream twice daily in front of and behind your ears.   Let us know if you need anything.
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
## 2021-10-25 NOTE — Progress Notes (Deleted)
   I, Jordan Bush, LAT, ATC, am serving as scribe for Dr. Lynne Bush.  Jordan Bush is a 50 y.o. female who presents to [place] at [facility] today for f/u of R lateral elbow pain.  She was last seen by Dr. Shelly Bush on 09/28/21 and was shown a HEP for lateral epicondylitis and prescribed nitro patches.  She was also advised to purchase an elbow compression sleeve.  Today, pt reports    Pertinent review of systems: ***  Relevant historical information: ***   Exam:  LMP  (LMP Unknown)  General: Well Developed, well nourished, and in no acute distress.   MSK: ***    Lab and Radiology Results Results for orders placed or performed in visit on 10/24/21 (from the past 72 hour(s))  Basic metabolic panel     Status: Abnormal   Collection Time: 10/24/21  8:14 AM  Result Value Ref Range   Sodium 139 135 - 145 mEq/L   Potassium 3.7 3.5 - 5.1 mEq/L   Chloride 103 96 - 112 mEq/L   CO2 27 19 - 32 mEq/L   Glucose, Bld 83 70 - 99 mg/dL   BUN 21 6 - 23 mg/dL   Creatinine, Ser 1.25 (H) 0.40 - 1.20 mg/dL   GFR 50.63 (L) >60.00 mL/min    Comment: Calculated using the CKD-EPI Creatinine Equation (2021)   Calcium 9.5 8.4 - 10.5 mg/dL   No results found.     Assessment and Plan: 50 y.o. female with ***   PDMP not reviewed this encounter. No orders of the defined types were placed in this encounter.  No orders of the defined types were placed in this encounter.    Discussed warning signs or symptoms. Please see discharge instructions. Patient expresses understanding.   ***

## 2021-10-26 ENCOUNTER — Ambulatory Visit: Payer: BC Managed Care – PPO | Admitting: Family Medicine

## 2021-10-28 NOTE — Progress Notes (Signed)
° °  I, [name], LAT, ATC, am serving as scribe for Dr. Lynne Leader.  Jordan Bush is a 50 y.o. female who presents to [place] at [facility] today for f/u of R lateral epicondylitis.  She was last seen by Dr.Feliza Diven on 09/28/21 and was prescribed nitroglycerin patches and shown a HEP focusing on forearm extensor eccentrics.  She was also advised to purchase an elbow sleeve.  Today, pt reports R elbow is improved and rates her pain now as only a 5-6/10. Pt notes R elbow is not achy/painful constantly. Pt had to dc nitro patches due to HA    Pertinent review of systems: No fevers or chills   Relevant historical information: Hypertension   Exam:  BP (!) 142/94    Pulse 90    Ht 5\' 3"  (1.6 m)    Wt 222 lb 9.6 oz (101 kg)    LMP  (LMP Unknown)    SpO2 100%    BMI 39.43 kg/m  General: Well Developed, well nourished, and in no acute distress.   MSK: Right elbow normal-appearing normal motion.     Assessment and Plan: 50 y.o. female with right lateral epicondylitis.  Improved a bit with home exercise program.  She is about 50% better but still having moderate pain.  Discussed further treatment options.  Plan for trial of hand PT.  Check back in about 2 months.  If not improved sufficiently consider steroid injection or PRP injection.  Recommend iontophoresis as part of hand PT.   PDMP not reviewed this encounter. Orders Placed This Encounter  Procedures   Ambulatory referral to Physical Therapy    Referral Priority:   Routine    Referral Type:   Physical Medicine    Referral Reason:   Specialty Services Required    Requested Specialty:   Physical Therapy    Number of Visits Requested:   1   No orders of the defined types were placed in this encounter.    Discussed warning signs or symptoms. Please see discharge instructions. Patient expresses understanding.   The above documentation has been reviewed and is accurate and complete Lynne Leader,  M.D.   Total encounter time 20 minutes including face-to-face time with the patient and, reviewing past medical record, and charting on the date of service.   Discussed treatment plan and options

## 2021-11-02 ENCOUNTER — Ambulatory Visit: Payer: BC Managed Care – PPO | Admitting: Family Medicine

## 2021-11-02 ENCOUNTER — Other Ambulatory Visit: Payer: Self-pay

## 2021-11-02 VITALS — BP 142/94 | HR 90 | Ht 63.0 in | Wt 222.6 lb

## 2021-11-02 DIAGNOSIS — M7711 Lateral epicondylitis, right elbow: Secondary | ICD-10-CM

## 2021-11-02 NOTE — Patient Instructions (Addendum)
Thank you for coming in today.   I've referred you to Physical Therapy.  Let us know if you don't hear from them in one week.   Recheck back in 2 months.

## 2021-11-10 DIAGNOSIS — M25621 Stiffness of right elbow, not elsewhere classified: Secondary | ICD-10-CM | POA: Diagnosis not present

## 2021-11-10 DIAGNOSIS — M25521 Pain in right elbow: Secondary | ICD-10-CM | POA: Diagnosis not present

## 2021-11-10 DIAGNOSIS — M25421 Effusion, right elbow: Secondary | ICD-10-CM | POA: Diagnosis not present

## 2021-11-10 DIAGNOSIS — M6281 Muscle weakness (generalized): Secondary | ICD-10-CM | POA: Diagnosis not present

## 2021-11-15 DIAGNOSIS — M25621 Stiffness of right elbow, not elsewhere classified: Secondary | ICD-10-CM | POA: Diagnosis not present

## 2021-11-15 DIAGNOSIS — M25521 Pain in right elbow: Secondary | ICD-10-CM | POA: Diagnosis not present

## 2021-11-15 DIAGNOSIS — M6281 Muscle weakness (generalized): Secondary | ICD-10-CM | POA: Diagnosis not present

## 2021-11-15 DIAGNOSIS — M25421 Effusion, right elbow: Secondary | ICD-10-CM | POA: Diagnosis not present

## 2021-11-16 ENCOUNTER — Ambulatory Visit (INDEPENDENT_AMBULATORY_CARE_PROVIDER_SITE_OTHER): Payer: BC Managed Care – PPO | Admitting: Family Medicine

## 2021-11-17 ENCOUNTER — Other Ambulatory Visit: Payer: Self-pay

## 2021-11-17 ENCOUNTER — Encounter (INDEPENDENT_AMBULATORY_CARE_PROVIDER_SITE_OTHER): Payer: Self-pay | Admitting: Family Medicine

## 2021-11-17 ENCOUNTER — Ambulatory Visit (INDEPENDENT_AMBULATORY_CARE_PROVIDER_SITE_OTHER): Payer: BC Managed Care – PPO | Admitting: Family Medicine

## 2021-11-17 VITALS — BP 140/86 | HR 83 | Temp 98.2°F | Ht 63.0 in | Wt 220.0 lb

## 2021-11-17 DIAGNOSIS — R7301 Impaired fasting glucose: Secondary | ICD-10-CM | POA: Diagnosis not present

## 2021-11-17 DIAGNOSIS — Z6839 Body mass index (BMI) 39.0-39.9, adult: Secondary | ICD-10-CM

## 2021-11-17 DIAGNOSIS — F418 Other specified anxiety disorders: Secondary | ICD-10-CM | POA: Diagnosis not present

## 2021-11-17 DIAGNOSIS — I10 Essential (primary) hypertension: Secondary | ICD-10-CM | POA: Diagnosis not present

## 2021-11-17 DIAGNOSIS — G4709 Other insomnia: Secondary | ICD-10-CM

## 2021-11-17 MED ORDER — TIRZEPATIDE 7.5 MG/0.5ML ~~LOC~~ SOAJ: 7.5000 mg | SUBCUTANEOUS | 0 refills | Status: DC

## 2021-11-17 MED ORDER — TRAZODONE HCL 50 MG PO TABS: 50.0000 mg | ORAL_TABLET | Freq: Every day | ORAL | 0 refills | Status: DC

## 2021-11-17 MED ORDER — TIRZEPATIDE 5 MG/0.5ML ~~LOC~~ SOAJ: 5.0000 mg | SUBCUTANEOUS | 0 refills | Status: DC

## 2021-11-21 NOTE — Progress Notes (Signed)
Chief Complaint:   OBESITY Jordan Bush is here to discuss her progress with her obesity treatment plan along with follow-up of her obesity related diagnoses. See Medical Weight Management Flowsheet for complete bioelectrical impedance results.  Today's visit was #: 69 Starting weight: 266 lbs Starting date: 09/22/2019 Weight change since last visit: +7 lbs Total lbs lost to date: 46 lbs Total weight loss percentage to date: -17.29%  Nutrition Plan: Category 3 Plan for a small percentage of the time.  Activity: Increased walking. Anti-obesity medications: Mounjaro 5 mg subcutaneously weekly. Reported side effects: None.  Interim History: Jordan Bush says that a family friend died recently and she is not sleeping well.  She has been doing some increased snacking.  Assessment/Plan:   1. Impaired fasting glucose, with polyphagia Not at goal. Current treatment: Mounjaro 5 mg subcutaneously weekly.    Plan: Continue Mounjaro 5 mg subcutaneously weekly and then increase to 7.5 mg weekly.  She will continue to focus on protein-rich, low simple carbohydrate foods. We reviewed the importance of hydration, regular exercise for stress reduction, and restorative sleep.  - Refill tirzepatide (MOUNJARO) 5 MG/0.5ML Pen; Inject 5 mg into the skin once a week.  Dispense: 2 mL; Refill: 0 - Increase tirzepatide (MOUNJARO) 7.5 MG/0.5ML Pen; Inject 7.5 mg into the skin once a week.  Dispense: 2 mL; Refill: 0  2. Essential hypertension Not at goal. Medications: Norvasc 5 mg daily, HCTZ 25 mg daily.   Plan: Avoid buying foods that are: processed, frozen, or prepackaged to avoid excess salt. We will watch for signs of hypotension as she continues lifestyle modifications.  BP Readings from Last 3 Encounters:  11/17/21 140/86  11/02/21 (!) 142/94  10/24/21 120/78   Lab Results  Component Value Date   CREATININE 1.25 (H) 10/24/2021   3. Other insomnia This is poorly controlled. Current treatment:  None.  Plan: Recommend sleep hygiene measures including regular sleep schedule, optimal sleep environment, and relaxing presleep rituals. Start trazodone 50 mg at bedtime.  - Start traZODone (DESYREL) 50 MG tablet; Take 1 tablet (50 mg total) by mouth at bedtime.  Dispense: 30 tablet; Refill: 0  4. Situational anxiety [name] is dealing with the recent death of a friend.  She takes Wellbutrin 150 mg daily and will start trazodone 50 mg at bedtime to help her sleep.  5. Obesity, current BMI 39.1  Course: [name] is currently in the action stage of change. As such, her goal is to continue with weight loss efforts.   Nutrition goals: She has agreed to the Category 3 Plan.   Exercise goals:  As is.  Behavioral modification strategies: increasing lean protein intake, decreasing simple carbohydrates, increasing vegetables, increasing water intake, and decreasing liquid calories.  [name] has agreed to follow-up with our clinic in 4 weeks. She was informed of the importance of frequent follow-up visits to maximize her success with intensive lifestyle modifications for her multiple health conditions.   Objective:   Blood pressure 140/86, pulse 83, temperature 98.2 F (36.8 C), temperature source Oral, height 5\' 3"  (1.6 m), weight 220 lb (99.8 kg), SpO2 99 %. Body mass index is 38.97 kg/m.  General: Cooperative, alert, well developed, in no acute distress. HEENT: Conjunctivae and lids unremarkable. Cardiovascular: Regular rhythm.  Lungs: Normal work of breathing. Neurologic: No focal deficits.   Lab Results  Component Value Date   CREATININE 1.25 (H) 10/24/2021   BUN 21 10/24/2021   NA 139 10/24/2021   K 3.7 10/24/2021   CL  103 10/24/2021   CO2 27 10/24/2021   Lab Results  Component Value Date   ALT 15 10/10/2021   AST 13 10/10/2021   ALKPHOS 62 10/10/2021   BILITOT 0.5 10/10/2021   Lab Results  Component Value Date   HGBA1C 5.3 05/30/2021   HGBA1C 5.5 08/16/2020   HGBA1C  5.2 09/22/2019   HGBA1C 5.6 06/27/2017   Lab Results  Component Value Date   INSULIN 10.3 05/30/2021   INSULIN 17.0 03/10/2020   INSULIN 32.8 (H) 09/22/2019   Lab Results  Component Value Date   TSH 0.940 09/22/2019   Lab Results  Component Value Date   CHOL 178 10/10/2021   HDL 42.10 10/10/2021   LDLCALC 115 (H) 10/10/2021   TRIG 105.0 10/10/2021   CHOLHDL 4 10/10/2021   Lab Results  Component Value Date   VD25OH 45.4 05/30/2021   VD25OH 70.4 08/16/2020   VD25OH 63.1 03/10/2020   Lab Results  Component Value Date   WBC 6.4 10/10/2021   HGB 12.1 10/10/2021   HCT 36.0 10/10/2021   MCV 86.5 10/10/2021   PLT 203.0 10/10/2021   Lab Results  Component Value Date   IRON 73 08/16/2020   TIBC 255 08/16/2020   FERRITIN 274 (H) 08/16/2020   Attestation Statements:   Reviewed by clinician on day of visit: allergies, medications, problem list, medical history, surgical history, family history, social history, and previous encounter notes.  I, Water quality scientist, CMA, am acting as transcriptionist for Briscoe Deutscher, DO  I have reviewed the above documentation for accuracy and completeness, and I agree with the above. -  Briscoe Deutscher, DO, MS, FAAFP, DABOM - Family and Bariatric Medicine.

## 2021-11-22 ENCOUNTER — Telehealth: Payer: Self-pay | Admitting: Family Medicine

## 2021-11-22 NOTE — Telephone Encounter (Signed)
Patient called stating that she was referred to [facility] and was advised to use iontophoresis but they do not offer that there.   Please advise.

## 2021-11-23 ENCOUNTER — Telehealth (INDEPENDENT_AMBULATORY_CARE_PROVIDER_SITE_OTHER): Payer: Self-pay

## 2021-11-23 NOTE — Telephone Encounter (Signed)
Patient states that Dr Georgina Snell mentioned to her about this particular modality while she was here for her visit. She felt like he thought this would be important for her treatment.  Does he want her to try to do this modality or is there something else she could do in place of it?

## 2021-11-23 NOTE — Telephone Encounter (Signed)
The usual treatment for hand therapy is often helpful for this.  The do some treatments including exercises heat and some massage type manipulation.  Sometimes will even do dry needling.  If not better we can do an injection.

## 2021-11-23 NOTE — Telephone Encounter (Signed)
Coverage for this medication is denied for the following reason(s). We reviewed the information we received about your condition and circumstances. We used plan approved criteria when making this decision. The policy states that this medication may be approved when: -The member is unable to take the required number of formulary alternatives for the given diagnosis due to an intolerance or contraindication OR -The member has tried and failed the required number of formulary alternatives. Based on the policy and the information we received your request is denied. We did not receive documentation that you meet the criteria outlined above. Formulary alternatives are: Ozempic, Rybelsus, Trulicity, Victoza. (Requirement: 3 in a class with 3 or more alternatives, 2 in a class with 2 alternatives, or 1 in a class with only 1 alternative.)

## 2021-11-24 NOTE — Telephone Encounter (Signed)
Can you please call the patient regarding the iontophoresis therapy? Is that was Dr Georgina Snell is referencing?

## 2021-11-25 NOTE — Telephone Encounter (Signed)
Spoke w/ Jordan Bush to discuss. Jordan Bush was advised to proceed w/ hand therapy and if not improving Dr. Georgina Snell could do a steroid injection. Jordan Bush verbalized understanding.

## 2021-11-29 DIAGNOSIS — H35033 Hypertensive retinopathy, bilateral: Secondary | ICD-10-CM | POA: Diagnosis not present

## 2021-12-01 ENCOUNTER — Ambulatory Visit
Admission: RE | Admit: 2021-12-01 | Discharge: 2021-12-01 | Disposition: A | Discharge status: Home / Self Care | Payer: BC Managed Care – PPO | Source: Ambulatory Visit | Attending: Emergency Medicine | Admitting: Emergency Medicine

## 2021-12-01 ENCOUNTER — Other Ambulatory Visit: Payer: Self-pay

## 2021-12-01 ENCOUNTER — Encounter: Payer: Self-pay | Admitting: General Practice

## 2021-12-01 VITALS — BP 139/98 | HR 83 | Temp 98.4°F | Resp 18

## 2021-12-01 DIAGNOSIS — J310 Chronic rhinitis: Secondary | ICD-10-CM | POA: Diagnosis not present

## 2021-12-01 DIAGNOSIS — J329 Chronic sinusitis, unspecified: Secondary | ICD-10-CM

## 2021-12-01 MED ORDER — METHYLPREDNISOLONE 4 MG PO TBPK: ORAL_TABLET | ORAL | 0 refills | Status: DC

## 2021-12-01 MED ORDER — METHYLPREDNISOLONE SODIUM SUCC 125 MG IJ SOLR
80.0000 mg | Freq: Once | INTRAMUSCULAR | Status: AC
  Administered 2021-12-01: 80 mg via INTRAMUSCULAR

## 2021-12-01 NOTE — ED Triage Notes (Addendum)
Pt states for the past two nights she has been having congestion, headache and burning sensation to sinus areas.
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
## 2021-12-01 NOTE — Discharge Instructions (Signed)
Please resume all of your allergy and asthma medications exactly as prescribed.  Please keep in mind that these medications are preventive not abortive.  For abortive treatment of your acute rhinosinusitis, you are provided with a methylprednisolone injection and a prescription for an oral tapering dose of methylprednisolone as well.  Please begin your first dose of methylprednisolone tomorrow morning with your breakfast.  Thank you for visiting urgent care today.  Please do not hesitate to let us know if there is anything else we can do for you.

## 2021-12-02 ENCOUNTER — Encounter: Payer: BC Managed Care – PPO | Admitting: Family Medicine

## 2021-12-05 ENCOUNTER — Encounter: Payer: Self-pay | Admitting: Family Medicine

## 2021-12-05 ENCOUNTER — Ambulatory Visit (INDEPENDENT_AMBULATORY_CARE_PROVIDER_SITE_OTHER): Payer: BC Managed Care – PPO | Admitting: Family Medicine

## 2021-12-05 VITALS — BP 138/78 | HR 82 | Temp 98.2°F | Ht 62.0 in | Wt 222.1 lb

## 2021-12-05 DIAGNOSIS — Z0000 Encounter for general adult medical examination without abnormal findings: Secondary | ICD-10-CM

## 2021-12-05 MED ORDER — AZELASTINE HCL 0.1 % NA SOLN: 2.0000 | Freq: Two times a day (BID) | NASAL | 12 refills | Status: DC

## 2021-12-05 NOTE — Patient Instructions (Addendum)
Good luck training for your half!  Keep the diet clean and stay active.  I recommend getting the updated bivalent covid vaccination booster at your convenience.   Let us know if you need anything.
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
## 2021-12-14 ENCOUNTER — Encounter (INDEPENDENT_AMBULATORY_CARE_PROVIDER_SITE_OTHER): Payer: Self-pay | Admitting: Family Medicine

## 2021-12-14 ENCOUNTER — Ambulatory Visit (INDEPENDENT_AMBULATORY_CARE_PROVIDER_SITE_OTHER): Payer: BC Managed Care – PPO | Admitting: Family Medicine

## 2021-12-14 ENCOUNTER — Other Ambulatory Visit: Payer: Self-pay

## 2021-12-14 VITALS — BP 127/91 | HR 86 | Temp 98.2°F | Ht 63.0 in | Wt 218.0 lb

## 2021-12-14 DIAGNOSIS — R7301 Impaired fasting glucose: Secondary | ICD-10-CM

## 2021-12-14 DIAGNOSIS — F418 Other specified anxiety disorders: Secondary | ICD-10-CM | POA: Diagnosis not present

## 2021-12-14 DIAGNOSIS — F3289 Other specified depressive episodes: Secondary | ICD-10-CM

## 2021-12-14 DIAGNOSIS — Z6838 Body mass index (BMI) 38.0-38.9, adult: Secondary | ICD-10-CM

## 2021-12-14 DIAGNOSIS — E669 Obesity, unspecified: Secondary | ICD-10-CM

## 2021-12-14 DIAGNOSIS — Z6841 Body Mass Index (BMI) 40.0 and over, adult: Secondary | ICD-10-CM

## 2021-12-15 NOTE — Progress Notes (Signed)
Chief Complaint:   OBESITY Jordan Bush is here to discuss her progress with her obesity treatment plan along with follow-up of her obesity related diagnoses. See Medical Weight Management Flowsheet for complete bioelectrical impedance results.  Today's visit was #: 42 Starting weight: 266 lbs Starting date: 09/22/2019 Weight change since last visit: 2 lbs Total lbs lost to date: 48 lbs Total weight loss percentage to date: -18.05%  Nutrition Plan: Category 3 Plan for 80% of the time.  Activity: Cardio for 45-60 minutes 3 times per week.  Anti-obesity medications: Mounjaro 7.5 mg subcutaneously weekly. Reported side effects: None.  Interim History: Jordan Bush is doing a 14 week program on her [name] App and has a race on May 6.  She says she did not buy [place] since our last visit  She says that work has been stressful.  Assessment/Plan:   1. Impaired fasting glucose, with polyphagia Controlled. Current treatment: Mounjaro 7.5 mg subcutaneously weekly.    Plan: She will continue to focus on protein-rich, low simple carbohydrate foods. We reviewed the importance of hydration, regular exercise for stress reduction, and restorative sleep.  2. Emotional eating tendencies Controlled. Medication: Wellbutrin 150 mg daily.    Plan:  Continue Wellbutrin.  Will refill today. Discussed cues and consequences, how thoughts affect eating, model of thoughts, feelings, and behaviors, and strategies for change by focusing on the cue. Discussed cognitive distortions, coping thoughts, and how to change your thoughts.  3. Situational anxiety Lakysha takes Wellbutrin 150 mg daily and trazodone 50 mg at bedtime to help her sleep.  Behavior modification techniques were discussed today to help [name] deal with her anxiety.   4. Obesity, current BMI 38.7  Course: [name] is currently in the action stage of change. As such, her goal is to continue with weight loss efforts.   Nutrition goals: She has  agreed to the Category 3 Plan.   Exercise goals:  As is.  Behavioral modification strategies: increasing lean protein intake, decreasing simple carbohydrates, increasing vegetables, and increasing water intake.  [name] has agreed to follow-up with our clinic in 4 weeks. She was informed of the importance of frequent follow-up visits to maximize her success with intensive lifestyle modifications for her multiple health conditions.   Objective:   Blood pressure (!) 127/91, pulse 86, temperature 98.2 F (36.8 C), temperature source Oral, height 5\' 3"  (1.6 m), weight 218 lb (98.9 kg), SpO2 100 %. Body mass index is 38.62 kg/m.  General: Cooperative, alert, well developed, in no acute distress. HEENT: Conjunctivae and lids unremarkable. Cardiovascular: Regular rhythm.  Lungs: Normal work of breathing. Neurologic: No focal deficits.   Lab Results  Component Value Date   CREATININE 1.25 (H) 10/24/2021   BUN 21 10/24/2021   NA 139 10/24/2021   K 3.7 10/24/2021   CL 103 10/24/2021   CO2 27 10/24/2021   Lab Results  Component Value Date   ALT 15 10/10/2021   AST 13 10/10/2021   ALKPHOS 62 10/10/2021   BILITOT 0.5 10/10/2021   Lab Results  Component Value Date   HGBA1C 5.3 05/30/2021   HGBA1C 5.5 08/16/2020   HGBA1C 5.2 09/22/2019   HGBA1C 5.6 06/27/2017   Lab Results  Component Value Date   INSULIN 10.3 05/30/2021   INSULIN 17.0 03/10/2020   INSULIN 32.8 (H) 09/22/2019   Lab Results  Component Value Date   TSH 0.940 09/22/2019   Lab Results  Component Value Date   CHOL 178 10/10/2021   HDL 42.10  10/10/2021   LDLCALC 115 (H) 10/10/2021   TRIG 105.0 10/10/2021   CHOLHDL 4 10/10/2021   Lab Results  Component Value Date   VD25OH 45.4 05/30/2021   VD25OH 70.4 08/16/2020   VD25OH 63.1 03/10/2020   Lab Results  Component Value Date   WBC 6.4 10/10/2021   HGB 12.1 10/10/2021   HCT 36.0 10/10/2021   MCV 86.5 10/10/2021   PLT 203.0 10/10/2021   Lab Results   Component Value Date   IRON 73 08/16/2020   TIBC 255 08/16/2020   FERRITIN 274 (H) 08/16/2020   Attestation Statements:   Reviewed by clinician on day of visit: allergies, medications, problem list, medical history, surgical history, family history, social history, and previous encounter notes.  I, Water quality scientist, CMA, am acting as transcriptionist for Briscoe Deutscher, DO  I have reviewed the above documentation for accuracy and completeness, and I agree with the above. -  Briscoe Deutscher, DO, MS, FAAFP, DABOM - Family and Bariatric Medicine.

## 2021-12-27 ENCOUNTER — Encounter (INDEPENDENT_AMBULATORY_CARE_PROVIDER_SITE_OTHER): Payer: Self-pay

## 2022-01-02 NOTE — Progress Notes (Signed)
° °  I, Peterson Lombard, LAT, ATC acting as a scribe for Lynne Leader, MD.  Jordan Bush is a 51 y.o. female who presents to [place] at [facility] today for f/u of R elbow pain due to lateral epicondylitis.  She was last seen by Dr. Georgina Snell on 11/02/21 and was referred to [facility] PT.  She had previously tried nitroglycerin patches but was unable to tolerate those due to HA side effects.  She has been shown a HEP.  Today, pt reports completing 3-4 visits at PT. Pt reports R elbow is still painful, having good days and bad. 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   Pertinent review of systems: No fevers or chills  Relevant historical information: History of adenocarcinoma in situ of the cervix   Exam:  BP (!) 130/92    Pulse 82    Ht 5\' 3"  (1.6 m)    Wt 224 lb (101.6 kg)    LMP  (LMP Unknown)    SpO2 99%    BMI 39.68 kg/m  General: Well Developed, well nourished, and in no acute distress.   MSK: Right elbow normal-appearing Tender palpation lateral epicondyle. Normal motion. Pain with resisted wrist extension    Lab and Radiology Results  Procedure: Real-time Ultrasound Guided Injection of lateral epicondyles common extensor tendon origin Device: Philips Affiniti 50G Images permanently stored and available for review in PACS Verbal informed consent obtained.  Discussed risks and benefits of procedure. Warned about infection bleeding damage to structures skin hypopigmentation and fat atrophy among others. Patient expresses understanding and agreement Time-out conducted.   Noted no overlying erythema, induration, or other signs of local infection.   Skin prepped in a sterile fashion.   Local anesthesia: Topical Ethyl chloride.   With sterile technique and under real time ultrasound guidance: 40 mg of Kenalog and 2 mL of Marcaine injected into the common extensor tendon origin at lateral epicondyles. Fluid seen entering the lateral  epicondyles.   Completed without difficulty   Pain immediately resolved suggesting accurate placement of the medication.   Advised to call if fevers/chills, erythema, induration, drainage, or persistent bleeding.   Images permanently stored and available for review in the ultrasound unit.  Impression: Technically successful ultrasound guided injection.      Assessment and Plan: 51 y.o. female with right lateral elbow pain thought to be due to lateral epicondylitis.  Treat with injection today.  Continue home exercise program and PT.  Recheck in 2 months.  If not improved consider MRI for either surgical or PRP injection planning.   PDMP not reviewed this encounter. Orders Placed This Encounter  Procedures   [name] LIMITED JOINT SPACE STRUCTURES UP RIGHT(NO LINKED CHARGES)    Standing Status:   Future    Number of Occurrences:   1    Standing Expiration Date:   07/03/2022    Order Specific Question:   Reason for Exam (SYMPTOM  OR DIAGNOSIS REQUIRED)    Answer:   right elbow pain    Order Specific Question:   Preferred imaging location?    Answer:   [facility]   No orders of the defined types were placed in this encounter.    Discussed warning signs or symptoms. Please see discharge instructions. Patient expresses understanding.   The above documentation has been reviewed and is accurate and complete Lynne Leader, M.D.

## 2022-01-03 ENCOUNTER — Other Ambulatory Visit: Payer: Self-pay

## 2022-01-03 ENCOUNTER — Ambulatory Visit: Payer: Self-pay

## 2022-01-03 ENCOUNTER — Ambulatory Visit: Payer: BC Managed Care – PPO | Admitting: Family Medicine

## 2022-01-03 VITALS — BP 130/92 | HR 82 | Ht 63.0 in | Wt 224.0 lb

## 2022-01-03 DIAGNOSIS — M7711 Lateral epicondylitis, right elbow: Secondary | ICD-10-CM | POA: Diagnosis not present

## 2022-01-03 NOTE — Patient Instructions (Addendum)
Thank you for coming in today.   You received a steroid injection in your right elbow today. Seek immediate medical attention if the joint becomes red, extremely painful, or is oozing fluid.   Continue working on the exercises at home  Recheck back in 2 months

## 2022-01-11 ENCOUNTER — Ambulatory Visit (INDEPENDENT_AMBULATORY_CARE_PROVIDER_SITE_OTHER): Payer: BC Managed Care – PPO | Admitting: Family Medicine

## 2022-01-16 ENCOUNTER — Other Ambulatory Visit: Payer: Self-pay

## 2022-01-16 ENCOUNTER — Encounter (INDEPENDENT_AMBULATORY_CARE_PROVIDER_SITE_OTHER): Payer: Self-pay | Admitting: Family Medicine

## 2022-01-16 ENCOUNTER — Ambulatory Visit (INDEPENDENT_AMBULATORY_CARE_PROVIDER_SITE_OTHER): Payer: BC Managed Care – PPO | Admitting: Family Medicine

## 2022-01-16 VITALS — BP 115/77 | HR 84 | Temp 98.6°F | Ht 63.0 in | Wt 215.0 lb

## 2022-01-16 DIAGNOSIS — R7301 Impaired fasting glucose: Secondary | ICD-10-CM | POA: Diagnosis not present

## 2022-01-16 DIAGNOSIS — E669 Obesity, unspecified: Secondary | ICD-10-CM

## 2022-01-16 DIAGNOSIS — I10 Essential (primary) hypertension: Secondary | ICD-10-CM

## 2022-01-16 DIAGNOSIS — Z6841 Body Mass Index (BMI) 40.0 and over, adult: Secondary | ICD-10-CM

## 2022-01-16 DIAGNOSIS — Z6838 Body mass index (BMI) 38.0-38.9, adult: Secondary | ICD-10-CM | POA: Diagnosis not present

## 2022-01-16 MED ORDER — TIRZEPATIDE 7.5 MG/0.5ML ~~LOC~~ SOAJ: 7.5000 mg | SUBCUTANEOUS | 0 refills | Status: DC

## 2022-01-17 NOTE — Progress Notes (Unsigned)
Chief Complaint:   OBESITY Jordan Bush is here to discuss her progress with her obesity treatment plan along with follow-up of her obesity related diagnoses. Jordan Bush is on the Category 3 Plan and states she is following her eating plan approximately 75% of the time. Jordan Bush states she is walking for 60 minutes 3 times per week and walking 10 miles 1 times per week.  Today's visit was #: 23 Starting weight: 266 lbs Starting date: 09/22/2019 Today's weight: 215 lbs Today's date: 01/16/2022 Total lbs lost to date: 51 lbs Total lbs lost since last in-office visit: 3 lbs  Interim History: Jordan Bush reports she has been focusing on increasing her activity and training for 1/2 mile marathon. She does still have significant cravings for sweets. She needs to decrease purchasing of candy and sugary snacks. She has a busy upcoming few weeks with many activities and events.   Subjective:   1. Impaired fasting glucose, with polyphagia Jordan Bush is currently on Mounjaro 7.5 mg with decent carbohydrates control. She denies any gastrointestinal side effects.   2. Hypertension Jordan Bush's blood pressure is well controlled. Her last blood pressure was 127/91. She denies chest pain, chest pressure or headaches. She is currently on Norvasc and HCTZ.  Assessment/Plan:   1. Impaired fasting glucose, with polyphagia We will refill Mounjaro 7.5 mg subcutaneous weekly for 1 month with no refills.   - tirzepatide (MOUNJARO) 7.5 MG/0.5ML Pen; Inject 7.5 mg into the skin once a week.  Dispense: 2 mL; Refill: 0  2. Hypertension Jordan Bush will continue current medications. She will follow up with her primary care physician for continued management. She is working on healthy weight loss and exercise to improve blood pressure control. We will watch for signs of hypotension as she continues her lifestyle modifications.  3. Obesity, with current BMI of 38.2 Jordan Bush is currently in the action stage of change. As such, her  goal is to continue with weight loss efforts. She has agreed to the Category 3 Plan.   Exercise goals: All adults should avoid inactivity. Some physical activity is better than none, and adults who participate in any amount of physical activity gain some health benefits.  Behavioral modification strategies: increasing lean protein intake, no skipping meals, meal planning and cooking strategies, keeping healthy foods in the home, and planning for success.  Jordan Bush has agreed to follow-up with our clinic in 3-4 weeks. She was informed of the importance of frequent follow-up visits to maximize her success with intensive lifestyle modifications for her multiple health conditions.   Objective:   Blood pressure 115/77, pulse 84, temperature 98.6 F (37 C), height '5\' 3"'$  (1.6 m), weight 215 lb (97.5 kg), SpO2 100 %. Body mass index is 38.09 kg/m.  General: Cooperative, alert, well developed, in no acute distress. HEENT: Conjunctivae and lids unremarkable. Cardiovascular: Regular rhythm.  Lungs: Normal work of breathing. Neurologic: No focal deficits.   Lab Results  Component Value Date   CREATININE 1.25 (H) 10/24/2021   BUN 21 10/24/2021   NA 139 10/24/2021   K 3.7 10/24/2021   CL 103 10/24/2021   CO2 27 10/24/2021   Lab Results  Component Value Date   ALT 15 10/10/2021   AST 13 10/10/2021   ALKPHOS 62 10/10/2021   BILITOT 0.5 10/10/2021   Lab Results  Component Value Date   HGBA1C 5.3 05/30/2021   HGBA1C 5.5 08/16/2020   HGBA1C 5.2 09/22/2019   HGBA1C 5.6 06/27/2017   Lab Results  Component Value Date  INSULIN 10.3 05/30/2021   INSULIN 17.0 03/10/2020   INSULIN 32.8 (H) 09/22/2019   Lab Results  Component Value Date   TSH 0.940 09/22/2019   Lab Results  Component Value Date   CHOL 178 10/10/2021   HDL 42.10 10/10/2021   LDLCALC 115 (H) 10/10/2021   TRIG 105.0 10/10/2021   CHOLHDL 4 10/10/2021   Lab Results  Component Value Date   VD25OH 45.4 05/30/2021    VD25OH 70.4 08/16/2020   VD25OH 63.1 03/10/2020   Lab Results  Component Value Date   WBC 6.4 10/10/2021   HGB 12.1 10/10/2021   HCT 36.0 10/10/2021   MCV 86.5 10/10/2021   PLT 203.0 10/10/2021   Lab Results  Component Value Date   IRON 73 08/16/2020   TIBC 255 08/16/2020   FERRITIN 274 (H) 08/16/2020   Attestation Statements:   Reviewed by clinician on day of visit: allergies, medications, problem list, medical history, surgical history, family history, social history, and previous encounter notes.   I, Lizbeth Bark, RMA, am acting as transcriptionist for Coralie Common, MD.  I have reviewed the above documentation for accuracy and completeness, and I agree with the above. -  ***

## 2022-02-08 ENCOUNTER — Ambulatory Visit (INDEPENDENT_AMBULATORY_CARE_PROVIDER_SITE_OTHER): Payer: BC Managed Care – PPO | Admitting: Family Medicine

## 2022-02-08 ENCOUNTER — Other Ambulatory Visit: Payer: Self-pay

## 2022-02-08 ENCOUNTER — Encounter (INDEPENDENT_AMBULATORY_CARE_PROVIDER_SITE_OTHER): Payer: Self-pay | Admitting: Family Medicine

## 2022-02-08 VITALS — BP 121/80 | HR 82 | Temp 98.1°F | Ht 63.0 in | Wt 215.0 lb

## 2022-02-08 DIAGNOSIS — I10 Essential (primary) hypertension: Secondary | ICD-10-CM | POA: Diagnosis not present

## 2022-02-08 DIAGNOSIS — E669 Obesity, unspecified: Secondary | ICD-10-CM | POA: Diagnosis not present

## 2022-02-08 DIAGNOSIS — F418 Other specified anxiety disorders: Secondary | ICD-10-CM

## 2022-02-08 DIAGNOSIS — R7301 Impaired fasting glucose: Secondary | ICD-10-CM | POA: Diagnosis not present

## 2022-02-08 DIAGNOSIS — Z6838 Body mass index (BMI) 38.0-38.9, adult: Secondary | ICD-10-CM

## 2022-02-08 MED ORDER — TIRZEPATIDE 7.5 MG/0.5ML ~~LOC~~ SOAJ: 7.5000 mg | SUBCUTANEOUS | 3 refills | Status: DC

## 2022-02-13 NOTE — Progress Notes (Signed)
Chief Complaint:   OBESITY Jordan Bush is here to discuss her progress with her obesity treatment plan along with follow-up of her obesity related diagnoses.   Today's visit was #: 86 Starting weight: 266 lbs Starting date: 09/22/2019 Today's weight: 215 lbs Today's date: 02/08/2022 Weight change since last visit: 0 Total lbs lost to date: 51 lbs Body mass index is 38.09 kg/m.  Total weight loss percentage to date: -19.17%  Current Meal Plan: the Category 3 Plan for 80% of the time.  Current Exercise Plan: Walking for 60 minutes 3 times per week. Current Anti-Obesity Medications: Mounjaro 7.5 mg subcutaneously weekly. Side effects: None.  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 She has a half marathon scheduled for May.  Assessment/Plan:   1. Impaired fasting glucose, with polyphagia Controlled. Current treatment: Mounjaro 7.5 mg subcutaneously weekly.     Plan: She will continue to focus on protein-rich, low simple carbohydrate foods. We reviewed the importance of hydration, regular exercise for stress reduction, and restorative sleep.  - Refill tirzepatide (MOUNJARO) 7.5 MG/0.5ML Pen; Inject 7.5 mg into the skin once a week.  Dispense: 2 mL; Refill: 3  2. Essential hypertension Controlled. Medications: Norvasc 5 mg daily and HCTZ 25 mg daily.   Plan: Avoid buying foods that are: processed, frozen, or prepackaged to avoid excess salt. We will watch for signs of hypotension as she continues lifestyle modifications.  BP Readings from Last 3 Encounters:  02/08/22 121/80  01/16/22 115/77  01/03/22 (!) 130/92   Lab Results  Component Value Date   CREATININE 1.25 (H) 10/24/2021   3. Situational anxiety Jordan Bush takes Wellbutrin 150 mg daily and trazodone 50 mg at bedtime to help her sleep.  Behavior modification techniques were discussed today to help Jordan Bush  deal with her anxiety.   4. Obesity, with current BMI of 38.2 Course: Jordan Bush is currently in the action stage of change. As such, her goal is to continue with weight loss efforts.   Nutrition goals: She has agreed to the Category 3 Plan.   Exercise goals: As is.  Behavioral modification strategies: increasing lean protein intake, decreasing simple carbohydrates, increasing vegetables, and increasing water intake.  Jordan Bush has agreed to follow-up with our clinic in 6 weeks. She was informed of the importance of frequent follow-up visits to maximize her success with intensive lifestyle modifications for her multiple health conditions.   Objective:   Blood pressure 121/80, pulse 82, temperature 98.1 F (36.7 C), temperature source Oral, height '5\' 3"'$  (1.6 m), weight 215 lb (97.5 kg), SpO2 99 %. Body mass index is 38.09 kg/m.  General: Cooperative, alert, well developed, in no acute distress. HEENT: Conjunctivae and lids unremarkable. Cardiovascular: Regular rhythm.  Lungs: Normal work of breathing. Neurologic: No focal deficits.   Lab Results  Component Value Date   CREATININE 1.25 (H) 10/24/2021   BUN 21 10/24/2021   NA 139 10/24/2021   K 3.7 10/24/2021   CL 103 10/24/2021   CO2 27 10/24/2021   Lab Results  Component Value Date   ALT 15 10/10/2021   AST 13 10/10/2021   ALKPHOS 62  10/10/2021   BILITOT 0.5 10/10/2021   Lab Results  Component Value Date   HGBA1C 5.3 05/30/2021   HGBA1C 5.5 08/16/2020   HGBA1C 5.2 09/22/2019   HGBA1C 5.6 06/27/2017   Lab Results  Component Value Date   INSULIN 10.3 05/30/2021   INSULIN 17.0 03/10/2020   INSULIN 32.8 (H) 09/22/2019   Lab Results  Component Value Date   TSH 0.940 09/22/2019   Lab Results  Component Value Date   CHOL 178 10/10/2021   HDL 42.10 10/10/2021   LDLCALC 115 (H) 10/10/2021   TRIG 105.0 10/10/2021   CHOLHDL 4 10/10/2021   Lab Results  Component Value Date   VD25OH 45.4 05/30/2021   VD25OH 70.4  08/16/2020   VD25OH 63.1 03/10/2020   Lab Results  Component Value Date   WBC 6.4 10/10/2021   HGB 12.1 10/10/2021   HCT 36.0 10/10/2021   MCV 86.5 10/10/2021   PLT 203.0 10/10/2021   Lab Results  Component Value Date   IRON 73 08/16/2020   TIBC 255 08/16/2020   FERRITIN 274 (H) 08/16/2020   Attestation Statements:   Reviewed by clinician on day of visit: allergies, medications, problem list, medical history, surgical history, family history, social history, and previous encounter notes.  Leodis Binet Friedenbach, CMA, am acting as Location manager for [employer], DO.  I have reviewed the above documentation for accuracy and completeness, and I agree with the above. -  Briscoe Deutscher, DO, MS, FAAFP, DABOM - Family and Bariatric Medicine.

## 2022-02-27 NOTE — Progress Notes (Signed)
? ?  I, Jordan Bush, LAT, ATC, am serving as scribe for Dr. Lynne Leader. ? ?Jordan Bush is a 51 y.o. female who presents to [place] at [facility] today for f/u of R elbow pain due to lateral epicondylitis.  She was last seen by Dr. Georgina Snell on 01/03/22 and was advised to con't HEP per PT.  She also had a R common forearm extensor tendon steroid injection.  Today, pt reports that her R elbow is feeling much better, rating her improvement at 95%.  She con't to do her HEP. ? ? ?Pertinent review of systems: No fevers or chills ? ?Relevant historical information: Hypertension ? ? ?Exam:  ?BP 114/84 (BP Location: Right Arm, Patient Position: Sitting, Cuff Size: Normal)   Pulse 78   Ht '5\' 3"'$  (1.6 m)   Wt 216 lb (98 kg)   LMP  (LMP Unknown)   SpO2 96%   BMI 38.26 kg/m?  ?General: Well Developed, well nourished, and in no acute distress.  ? ?MSK: Right elbow: Normal. ?Not particularly tender to palpation at the lateral elbow.  Normal elbow motion.  No pain with resisted wrist and finger extension. ? ? ? ? ?Assessment and Plan: ?51 y.o. female with lateral epicondylitis of the right elbow improved with steroid injection 2 months ago.  Plan to continue home exercise program and check back as needed. ? ?Discussed precautions and potential next steps.  ? ? ? ? ?Discussed warning signs or symptoms. Please see discharge instructions. Patient expresses understanding. ? ? ?The above documentation has been reviewed and is accurate and complete Lynne Leader, M.D. ? ? ?

## 2022-02-28 ENCOUNTER — Ambulatory Visit: Payer: BC Managed Care – PPO | Admitting: Family Medicine

## 2022-02-28 ENCOUNTER — Encounter: Payer: Self-pay | Admitting: Family Medicine

## 2022-02-28 VITALS — BP 114/84 | HR 78 | Ht 63.0 in | Wt 216.0 lb

## 2022-02-28 DIAGNOSIS — M7711 Lateral epicondylitis, right elbow: Secondary | ICD-10-CM

## 2022-02-28 NOTE — Patient Instructions (Signed)
Good to see you today. ? ?Con't your home exercises. ? ?Follow-up as needed. ?

## 2022-03-28 ENCOUNTER — Telehealth (INDEPENDENT_AMBULATORY_CARE_PROVIDER_SITE_OTHER): Payer: BC Managed Care – PPO | Admitting: Family Medicine

## 2022-03-28 ENCOUNTER — Encounter: Payer: Self-pay | Admitting: Family Medicine

## 2022-03-28 DIAGNOSIS — F411 Generalized anxiety disorder: Secondary | ICD-10-CM | POA: Diagnosis not present

## 2022-03-28 MED ORDER — CITALOPRAM HYDROBROMIDE 20 MG PO TABS: 20.0000 mg | ORAL_TABLET | Freq: Every day | ORAL | 3 refills | Status: DC
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
## 2022-04-03 ENCOUNTER — Other Ambulatory Visit: Payer: Self-pay | Admitting: Family Medicine

## 2022-04-03 DIAGNOSIS — I10 Essential (primary) hypertension: Secondary | ICD-10-CM

## 2022-04-03 MED ORDER — AMLODIPINE BESYLATE 5 MG PO TABS: 5.0000 mg | ORAL_TABLET | Freq: Every day | ORAL | 1 refills | Status: DC

## 2022-04-04 DIAGNOSIS — F411 Generalized anxiety disorder: Secondary | ICD-10-CM | POA: Diagnosis not present

## 2022-04-06 ENCOUNTER — Encounter: Payer: Self-pay | Admitting: Family Medicine

## 2022-04-12 ENCOUNTER — Telehealth: Payer: Self-pay | Admitting: Family Medicine

## 2022-04-12 NOTE — Telephone Encounter (Signed)
PCP completed FMLA paperwork Faxed and received fax confirmation Patient aware Faxed to Attn:  Cayey Case # [id number] ID# [id number]

## 2022-04-18 ENCOUNTER — Telehealth: Payer: Self-pay | Admitting: Family Medicine

## 2022-04-18 NOTE — Telephone Encounter (Signed)
Forms faxed into front office(two set)(prudential cover sheet on both) Placed in [place] bin up front

## 2022-04-19 NOTE — Telephone Encounter (Signed)
Called the patient to inform PCP does not do disability paperwork for [employer]. She stated will call her therapist.

## 2022-04-21 NOTE — Telephone Encounter (Signed)
Received message from the patient they did not receive Refaxed document to [phone]

## 2022-04-26 DIAGNOSIS — F411 Generalized anxiety disorder: Secondary | ICD-10-CM | POA: Diagnosis not present

## 2022-05-10 DIAGNOSIS — F411 Generalized anxiety disorder: Secondary | ICD-10-CM | POA: Diagnosis not present

## 2022-05-17 DIAGNOSIS — F411 Generalized anxiety disorder: Secondary | ICD-10-CM | POA: Diagnosis not present

## 2022-06-01 ENCOUNTER — Encounter (INDEPENDENT_AMBULATORY_CARE_PROVIDER_SITE_OTHER): Payer: Self-pay | Admitting: Family Medicine

## 2022-06-01 ENCOUNTER — Ambulatory Visit (INDEPENDENT_AMBULATORY_CARE_PROVIDER_SITE_OTHER): Payer: BC Managed Care – PPO | Admitting: Family Medicine

## 2022-06-01 VITALS — BP 108/75 | HR 80 | Temp 98.3°F | Ht 63.0 in | Wt 210.0 lb

## 2022-06-01 DIAGNOSIS — E669 Obesity, unspecified: Secondary | ICD-10-CM

## 2022-06-01 DIAGNOSIS — R7989 Other specified abnormal findings of blood chemistry: Secondary | ICD-10-CM | POA: Diagnosis not present

## 2022-06-01 DIAGNOSIS — E559 Vitamin D deficiency, unspecified: Secondary | ICD-10-CM | POA: Diagnosis not present

## 2022-06-01 DIAGNOSIS — Z6837 Body mass index (BMI) 37.0-37.9, adult: Secondary | ICD-10-CM

## 2022-06-01 DIAGNOSIS — E7800 Pure hypercholesterolemia, unspecified: Secondary | ICD-10-CM

## 2022-06-01 DIAGNOSIS — E8881 Metabolic syndrome: Secondary | ICD-10-CM | POA: Diagnosis not present

## 2022-06-01 DIAGNOSIS — F411 Generalized anxiety disorder: Secondary | ICD-10-CM | POA: Diagnosis not present

## 2022-06-02 ENCOUNTER — Ambulatory Visit: Payer: BC Managed Care – PPO | Admitting: Family Medicine

## 2022-06-02 LAB — COMPREHENSIVE METABOLIC PANEL
ALT: 27 IU/L (ref 0–32)
AST: 23 IU/L (ref 0–40)
Albumin/Globulin Ratio: 1.5 (ref 1.2–2.2)
Albumin: 4.5 g/dL (ref 3.9–4.9)
Alkaline Phosphatase: 73 IU/L (ref 44–121)
BUN/Creatinine Ratio: 21 (ref 9–23)
BUN: 25 mg/dL — ABNORMAL HIGH (ref 6–24)
Bilirubin Total: 0.3 mg/dL (ref 0.0–1.2)
CO2: 25 mmol/L (ref 20–29)
Calcium: 9.3 mg/dL (ref 8.7–10.2)
Chloride: 97 mmol/L (ref 96–106)
Creatinine, Ser: 1.19 mg/dL — ABNORMAL HIGH (ref 0.57–1.00)
Globulin, Total: 3 g/dL (ref 1.5–4.5)
Glucose: 79 mg/dL (ref 70–99)
Potassium: 3.8 mmol/L (ref 3.5–5.2)
Sodium: 139 mmol/L (ref 134–144)
Total Protein: 7.5 g/dL (ref 6.0–8.5)
eGFR: 56 mL/min/{1.73_m2} — ABNORMAL LOW (ref >59)

## 2022-06-02 LAB — LIPID PANEL WITH LDL/HDL RATIO
Cholesterol, Total: 165 mg/dL (ref 100–199)
HDL: 41 mg/dL (ref >39)
LDL Chol Calc (NIH): 105 mg/dL — ABNORMAL HIGH (ref 0–99)
LDL/HDL Ratio: 2.6 ratio (ref 0.0–3.2)
Triglycerides: 104 mg/dL (ref 0–149)
VLDL Cholesterol Cal: 19 mg/dL (ref 5–40)

## 2022-06-02 LAB — INSULIN, RANDOM: INSULIN: 4 u[IU]/mL (ref 2.6–24.9)

## 2022-06-02 LAB — VITAMIN D 25 HYDROXY (VIT D DEFICIENCY, FRACTURES): Vit D, 25-Hydroxy: 69.4 ng/mL (ref 30.0–100.0)

## 2022-06-02 LAB — HEMOGLOBIN A1C
Est. average glucose Bld gHb Est-mCnc: 108 mg/dL
Hgb A1c MFr Bld: 5.4 % (ref 4.8–5.6)

## 2022-06-05 ENCOUNTER — Ambulatory Visit: Payer: BC Managed Care – PPO | Admitting: Family Medicine

## 2022-06-06 NOTE — Progress Notes (Signed)
Chief Complaint:   OBESITY Jordan Bush is here to discuss her progress with her obesity treatment plan along with follow-up of her obesity related diagnoses. Anasophia is on the Category 3 Plan and states Jordan Bush is following her eating plan approximately 50% of the time. Jamiaya states Jordan Bush is exercising 60 minutes 3 times per week.  Today's visit was #: 47 Starting weight: 266 lbs Starting date: 09/22/2019 Today's weight: 210 lbs Today's date: 06/01/2022 Total lbs lost to date: 56 lbs Total lbs lost since last in-office visit: 5  Interim History: Jordan Bush is just returning to clinic since 02/08/22. Jordan Bush has a lot of life stressors and changes. Jordan Bush is planning on giving her 2 weeks notice today and still trying to be mindful of food choices. Sometime Jordan Bush is doing a [employer] shake for breakfast.  Subjective:   1. Insulin resistance Keiara is currently on Mounjaro with good weight loss and carb intake control. Denies GI side effects.  2. Vitamin D deficiency Jordan Bush is not on prescription Vit D. Jordan Bush is taking Vit D 5k IU/daily.  3. Elevated serum creatinine Delitha's last Cr of 1.25 in Dec of 2022.  4. Pure hypercholesterolemia Shaylynne's LDL previously of 115, HDL of 42 and Trigly of 105. Jordan Bush is not on a statin.  Assessment/Plan:   1. Insulin resistance We will obtain labs today.  - Hemoglobin A1c - Insulin, random  2. Vitamin D deficiency We will obtain labs today.  - VITAMIN D 25 Hydroxy (Vit-D Deficiency, Fractures)  3. Elevated serum creatinine We will obtain labs today.  - Comprehensive metabolic panel  4. Pure hypercholesterolemia We will obtain labs today.  - Lipid Panel With LDL/HDL Ratio  5. Obesity, with current BMI of 37.3 Jordan Bush is currently in the action stage of change. As such, her goal is to continue with weight loss efforts. Jordan Bush has agreed to the Category 3 Plan.   Exercise goals: Jordan Bush to focus on resistance training over the next few  weeks.  Behavioral modification strategies: increasing lean protein intake, meal planning and cooking strategies, keeping healthy foods in the home, and planning for success.  Jordan Bush has agreed to follow-up with our clinic in as needed. Jordan Bush was informed of the importance of frequent follow-up visits to maximize her success with intensive lifestyle modifications for her multiple health conditions.   Jordan Bush was informed we would discuss her lab results at her next visit unless there is a critical issue that needs to be addressed sooner. Jordan Bush agreed to keep her next visit at the agreed upon time to discuss these results.  Objective:   Blood pressure 108/75, pulse 80, temperature 98.3 F (36.8 C), height '5\' 3"'$  (1.6 m), weight 210 lb (95.3 kg), SpO2 100 %. Body mass index is 37.2 kg/m.  General: Cooperative, alert, well developed, in no acute distress. HEENT: Conjunctivae and lids unremarkable. Cardiovascular: Regular rhythm.  Lungs: Normal work of breathing. Neurologic: No focal deficits.   Lab Results  Component Value Date   CREATININE 1.19 (H) 06/01/2022   BUN 25 (H) 06/01/2022   NA 139 06/01/2022   K 3.8 06/01/2022   CL 97 06/01/2022   CO2 25 06/01/2022   Lab Results  Component Value Date   ALT 27 06/01/2022   AST 23 06/01/2022   ALKPHOS 73 06/01/2022   BILITOT 0.3 06/01/2022   Lab Results  Component Value Date   HGBA1C 5.4 06/01/2022   HGBA1C 5.3 05/30/2021   HGBA1C 5.5 08/16/2020   HGBA1C 5.2 09/22/2019  HGBA1C 5.6 06/27/2017   Lab Results  Component Value Date   INSULIN 4.0 06/01/2022   INSULIN 10.3 05/30/2021   INSULIN 17.0 03/10/2020   INSULIN 32.8 (H) 09/22/2019   Lab Results  Component Value Date   TSH 0.940 09/22/2019   Lab Results  Component Value Date   CHOL 165 06/01/2022   HDL 41 06/01/2022   LDLCALC 105 (H) 06/01/2022   TRIG 104 06/01/2022   CHOLHDL 4 10/10/2021   Lab Results  Component Value Date   VD25OH 69.4 06/01/2022   VD25OH  45.4 05/30/2021   VD25OH 70.4 08/16/2020   Lab Results  Component Value Date   WBC 6.4 10/10/2021   HGB 12.1 10/10/2021   HCT 36.0 10/10/2021   MCV 86.5 10/10/2021   PLT 203.0 10/10/2021   Lab Results  Component Value Date   IRON 73 08/16/2020   TIBC 255 08/16/2020   FERRITIN 274 (H) 08/16/2020   Attestation Statements:   Reviewed by clinician on day of visit: allergies, medications, problem list, medical history, surgical history, family history, social history, and previous encounter notes.  I, Elnora Morrison, RMA am acting as transcriptionist for Coralie Common, MD.  I have reviewed the above documentation for accuracy and completeness, and I agree with the above. - Coralie Common, MD

## 2022-06-09 ENCOUNTER — Ambulatory Visit: Payer: BC Managed Care – PPO | Admitting: Family Medicine

## 2022-06-09 ENCOUNTER — Encounter: Payer: Self-pay | Admitting: Family Medicine

## 2022-06-09 VITALS — BP 108/72 | HR 64 | Temp 98.4°F | Ht 62.0 in | Wt 215.5 lb

## 2022-06-09 DIAGNOSIS — F411 Generalized anxiety disorder: Secondary | ICD-10-CM | POA: Diagnosis not present

## 2022-06-09 DIAGNOSIS — I10 Essential (primary) hypertension: Secondary | ICD-10-CM | POA: Diagnosis not present

## 2022-06-09 NOTE — Patient Instructions (Addendum)
Keep the diet clean and stay active.  Let me know if your anxiety decreases after you change your job and we can discuss coming off of the Celexa.   The Shingrix vaccine (for shingles) is a 2 shot series spaced 2-6 months apart. It can make people feel low energy, achy and almost like they have the flu for 48 hours after injection. 1/5 people can have nausea and/or vomiting. Please plan accordingly when deciding on when to get this shot. Call our office for a nurse visit appointment to get this. The second shot of the series is less severe regarding the side effects, but it still lasts 48 hours.   Let us know if you need anything.
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
## 2022-06-12 ENCOUNTER — Encounter: Payer: Self-pay | Admitting: Family Medicine

## 2022-06-13 ENCOUNTER — Other Ambulatory Visit: Payer: Self-pay | Admitting: Family Medicine

## 2022-06-13 DIAGNOSIS — R7301 Impaired fasting glucose: Secondary | ICD-10-CM

## 2022-06-13 DIAGNOSIS — I10 Essential (primary) hypertension: Secondary | ICD-10-CM

## 2022-06-13 DIAGNOSIS — F411 Generalized anxiety disorder: Secondary | ICD-10-CM

## 2022-06-13 DIAGNOSIS — J4521 Mild intermittent asthma with (acute) exacerbation: Secondary | ICD-10-CM

## 2022-06-13 DIAGNOSIS — J3089 Other allergic rhinitis: Secondary | ICD-10-CM

## 2022-06-13 MED ORDER — LEVOCETIRIZINE DIHYDROCHLORIDE 5 MG PO TABS: 5.0000 mg | ORAL_TABLET | Freq: Every day | ORAL | 1 refills | Status: DC

## 2022-06-13 MED ORDER — AMLODIPINE BESYLATE 5 MG PO TABS: 5.0000 mg | ORAL_TABLET | Freq: Every day | ORAL | 1 refills | Status: DC

## 2022-06-13 MED ORDER — EPINEPHRINE 0.3 MG/0.3ML IJ SOAJ: 0.3000 mg | Freq: Once | INTRAMUSCULAR | 0 refills | Status: DC

## 2022-06-13 MED ORDER — ALBUTEROL SULFATE HFA 108 (90 BASE) MCG/ACT IN AERS
1.0000 | INHALATION_SPRAY | Freq: Four times a day (QID) | RESPIRATORY_TRACT | 2 refills | Status: DC | PRN
Start: 2022-06-13 — End: 2024-07-02

## 2022-06-13 MED ORDER — VITAMIN D3 125 MCG (5000 UT) PO TABS: 1.0000 | ORAL_TABLET | Freq: Every day | ORAL | 1 refills | Status: DC

## 2022-06-13 MED ORDER — FLUTICASONE PROPIONATE 50 MCG/ACT NA SUSP: 2.0000 | Freq: Every day | NASAL | 1 refills | Status: DC

## 2022-06-13 MED ORDER — FLUTICASONE PROPIONATE HFA 110 MCG/ACT IN AERO: 1.0000 | INHALATION_SPRAY | Freq: Two times a day (BID) | RESPIRATORY_TRACT | 2 refills | Status: DC

## 2022-06-13 MED ORDER — CITALOPRAM HYDROBROMIDE 20 MG PO TABS: 20.0000 mg | ORAL_TABLET | Freq: Every day | ORAL | 1 refills | Status: DC

## 2022-06-13 MED ORDER — TIRZEPATIDE 7.5 MG/0.5ML ~~LOC~~ SOAJ: 7.5000 mg | SUBCUTANEOUS | 1 refills | Status: DC

## 2022-06-13 MED ORDER — HYDROCHLOROTHIAZIDE 25 MG PO TABS: 25.0000 mg | ORAL_TABLET | Freq: Every day | ORAL | 1 refills | Status: DC

## 2022-06-21 ENCOUNTER — Encounter (INDEPENDENT_AMBULATORY_CARE_PROVIDER_SITE_OTHER): Payer: Self-pay

## 2022-10-16 ENCOUNTER — Other Ambulatory Visit: Payer: Self-pay | Admitting: Family Medicine

## 2022-10-16 DIAGNOSIS — I10 Essential (primary) hypertension: Secondary | ICD-10-CM

## 2022-10-18 ENCOUNTER — Other Ambulatory Visit: Payer: Self-pay | Admitting: Family Medicine

## 2022-10-18 DIAGNOSIS — I10 Essential (primary) hypertension: Secondary | ICD-10-CM

## 2022-11-07 ENCOUNTER — Encounter: Payer: Self-pay | Admitting: Family Medicine

## 2022-11-07 ENCOUNTER — Telehealth (INDEPENDENT_AMBULATORY_CARE_PROVIDER_SITE_OTHER): Payer: Self-pay | Admitting: Family Medicine

## 2022-11-07 VITALS — BP 128/85 | HR 112 | Temp 101.3°F | Ht 62.0 in | Wt 215.5 lb

## 2022-11-07 DIAGNOSIS — U071 COVID-19: Secondary | ICD-10-CM

## 2022-11-07 MED ORDER — MOLNUPIRAVIR EUA 200MG CAPSULE: 4.0000 | ORAL_CAPSULE | Freq: Two times a day (BID) | ORAL | 0 refills | Status: AC
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
## 2022-12-11 ENCOUNTER — Encounter: Payer: Self-pay | Admitting: Family Medicine

## 2022-12-11 ENCOUNTER — Ambulatory Visit (INDEPENDENT_AMBULATORY_CARE_PROVIDER_SITE_OTHER): Payer: Self-pay | Admitting: Family Medicine

## 2022-12-11 VITALS — BP 118/82 | HR 79 | Temp 98.6°F | Ht 62.0 in | Wt 243.0 lb

## 2022-12-11 DIAGNOSIS — Z0000 Encounter for general adult medical examination without abnormal findings: Secondary | ICD-10-CM

## 2022-12-11 DIAGNOSIS — J3089 Other allergic rhinitis: Secondary | ICD-10-CM

## 2022-12-11 DIAGNOSIS — I10 Essential (primary) hypertension: Secondary | ICD-10-CM

## 2022-12-11 LAB — COMPREHENSIVE METABOLIC PANEL
ALT: 18 U/L (ref 0–35)
AST: 16 U/L (ref 0–37)
Albumin: 4.2 g/dL (ref 3.5–5.2)
Alkaline Phosphatase: 56 U/L (ref 39–117)
BUN: 24 mg/dL — ABNORMAL HIGH (ref 6–23)
CO2: 27 mEq/L (ref 19–32)
Calcium: 9.6 mg/dL (ref 8.4–10.5)
Chloride: 103 mEq/L (ref 96–112)
Creatinine, Ser: 1.09 mg/dL (ref 0.40–1.20)
GFR: 59.2 mL/min — ABNORMAL LOW (ref >60.00)
Glucose, Bld: 97 mg/dL (ref 70–99)
Potassium: 3.8 mEq/L (ref 3.5–5.1)
Sodium: 140 mEq/L (ref 135–145)
Total Bilirubin: 0.3 mg/dL (ref 0.2–1.2)
Total Protein: 7.1 g/dL (ref 6.0–8.3)

## 2022-12-11 LAB — CBC
HCT: 36.4 % (ref 36.0–46.0)
Hemoglobin: 12.1 g/dL (ref 12.0–15.0)
MCHC: 33.2 g/dL (ref 30.0–36.0)
MCV: 85.7 fl (ref 78.0–100.0)
Platelets: 220 10*3/uL (ref 150.0–400.0)
RBC: 4.25 Mil/uL (ref 3.87–5.11)
RDW: 14.3 % (ref 11.5–15.5)
WBC: 6.5 10*3/uL (ref 4.0–10.5)

## 2022-12-11 LAB — LIPID PANEL
Cholesterol: 182 mg/dL (ref 0–200)
HDL: 46 mg/dL (ref >39.00)
LDL Cholesterol: 114 mg/dL — ABNORMAL HIGH (ref 0–99)
NonHDL: 136.47
Total CHOL/HDL Ratio: 4
Triglycerides: 112 mg/dL (ref 0.0–149.0)
VLDL: 22.4 mg/dL (ref 0.0–40.0)

## 2022-12-11 MED ORDER — AMLODIPINE BESYLATE 5 MG PO TABS: ORAL_TABLET | ORAL | 1 refills | Status: DC

## 2022-12-11 MED ORDER — HYDROCHLOROTHIAZIDE 25 MG PO TABS: 25.0000 mg | ORAL_TABLET | Freq: Every day | ORAL | 1 refills | Status: DC

## 2022-12-11 MED ORDER — LEVOCETIRIZINE DIHYDROCHLORIDE 5 MG PO TABS: 5.0000 mg | ORAL_TABLET | Freq: Every day | ORAL | 1 refills | Status: DC

## 2022-12-11 MED ORDER — FLUTICASONE PROPIONATE 50 MCG/ACT NA SUSP
2.0000 | Freq: Every day | NASAL | 1 refills | Status: DC
Start: 2022-12-11 — End: 2023-12-17
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
## 2022-12-11 NOTE — Patient Instructions (Addendum)
Give [name] 2-3 business days to get the results of your labs back.   Keep the diet clean and stay active.  Please get me a copy of your advanced directive form at your convenience.   The Shingrix vaccine (for shingles) is a 2 shot series spaced 2-6 months apart. It can make people feel low energy, achy and almost like they have the flu for 48 hours after injection. 1/5 people can have nausea and/or vomiting. Please plan accordingly when deciding on when to get this shot. Call our office for a nurse visit appointment to get this. The second shot of the series is less severe regarding the side effects, but it still lasts 48 hours.   Use GoodRx for your prescriptions.   Start using your wrist brace to help your elbow.   Let us know if you need anything.

## 2023-03-04 ENCOUNTER — Encounter (HOSPITAL_COMMUNITY): Payer: Self-pay

## 2023-03-04 ENCOUNTER — Ambulatory Visit (HOSPITAL_COMMUNITY)
Admission: EM | Admit: 2023-03-04 | Discharge: 2023-03-04 | Disposition: A | Discharge status: Home / Self Care | Payer: Self-pay | Attending: Family Medicine | Admitting: Family Medicine

## 2023-03-04 DIAGNOSIS — J019 Acute sinusitis, unspecified: Secondary | ICD-10-CM

## 2023-03-04 DIAGNOSIS — J4521 Mild intermittent asthma with (acute) exacerbation: Secondary | ICD-10-CM

## 2023-03-04 MED ORDER — BENZONATATE 100 MG PO CAPS: 100.0000 mg | ORAL_CAPSULE | Freq: Three times a day (TID) | ORAL | 0 refills | Status: DC | PRN

## 2023-03-04 MED ORDER — PREDNISONE 20 MG PO TABS: 40.0000 mg | ORAL_TABLET | Freq: Every day | ORAL | 0 refills | Status: AC

## 2023-03-04 MED ORDER — DOXYCYCLINE HYCLATE 100 MG PO CAPS: 100.0000 mg | ORAL_CAPSULE | Freq: Two times a day (BID) | ORAL | 0 refills | Status: AC
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
## 2023-03-04 NOTE — ED Notes (Signed)
Reviewed work note and good rx coupons printed by provider

## 2023-03-04 NOTE — ED Triage Notes (Signed)
"  I have been sick for about 3 wks, started as sinus problems, has been in chest for about 2 wks". Cough "fits at work". Cough is becoming "more and more with SOB". No fever.

## 2023-03-04 NOTE — Discharge Instructions (Signed)
Take prednisone 20 mg--2 daily for 5 days  Take benzonatate 100 mg, 1 tab every 8 hours as needed for cough.  Take doxycycline 100 mg --1 capsule 2 times daily for 7 days  Use the inhaler as needed that you were prescribed previously

## 2023-05-07 DIAGNOSIS — H35033 Hypertensive retinopathy, bilateral: Secondary | ICD-10-CM | POA: Diagnosis not present

## 2023-06-11 ENCOUNTER — Ambulatory Visit: Payer: Self-pay | Admitting: Family Medicine

## 2023-06-13 ENCOUNTER — Encounter (INDEPENDENT_AMBULATORY_CARE_PROVIDER_SITE_OTHER): Payer: Self-pay

## 2023-06-14 ENCOUNTER — Ambulatory Visit: Payer: Self-pay | Admitting: Family Medicine

## 2023-06-20 ENCOUNTER — Ambulatory Visit: Payer: Self-pay | Admitting: Family Medicine

## 2023-06-27 ENCOUNTER — Encounter: Payer: Self-pay | Admitting: Family Medicine

## 2023-06-27 ENCOUNTER — Ambulatory Visit: Payer: No Typology Code available for payment source | Admitting: Family Medicine

## 2023-06-27 VITALS — BP 120/78 | HR 62 | Temp 98.8°F | Ht 62.0 in | Wt 260.1 lb

## 2023-06-27 DIAGNOSIS — I10 Essential (primary) hypertension: Secondary | ICD-10-CM | POA: Diagnosis not present

## 2023-06-27 DIAGNOSIS — M79671 Pain in right foot: Secondary | ICD-10-CM

## 2023-06-27 DIAGNOSIS — Z1231 Encounter for screening mammogram for malignant neoplasm of breast: Secondary | ICD-10-CM

## 2023-06-27 DIAGNOSIS — M79672 Pain in left foot: Secondary | ICD-10-CM | POA: Diagnosis not present

## 2023-06-27 NOTE — Patient Instructions (Addendum)
Keep the diet clean and stay active.  Aim to do some physical exertion for 150 minutes per week. This is typically divided into 5 days per week, 30 minutes per day. The activity should be enough to get your heart rate up. Anything is better than nothing if you have time constraints.  Please consider adding some weight resistance exercise to your routine. Consider yoga as well.   Consider heel cups.   If you do not hear anything about your referral in the next 1-2 weeks, call our office and ask for an update.  Let us know if you need anything.
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
## 2023-07-03 ENCOUNTER — Encounter (HOSPITAL_BASED_OUTPATIENT_CLINIC_OR_DEPARTMENT_OTHER): Payer: Self-pay

## 2023-07-03 ENCOUNTER — Ambulatory Visit (HOSPITAL_BASED_OUTPATIENT_CLINIC_OR_DEPARTMENT_OTHER)
Admission: RE | Admit: 2023-07-03 | Discharge: 2023-07-03 | Disposition: A | Discharge status: Home / Self Care | Payer: No Typology Code available for payment source | Source: Ambulatory Visit | Attending: Family Medicine | Admitting: Family Medicine

## 2023-07-03 DIAGNOSIS — Z1231 Encounter for screening mammogram for malignant neoplasm of breast: Secondary | ICD-10-CM | POA: Insufficient documentation

## 2023-07-04 ENCOUNTER — Encounter: Payer: Self-pay | Admitting: Family Medicine

## 2023-07-04 ENCOUNTER — Ambulatory Visit: Payer: No Typology Code available for payment source | Admitting: Family Medicine

## 2023-07-04 ENCOUNTER — Ambulatory Visit (INDEPENDENT_AMBULATORY_CARE_PROVIDER_SITE_OTHER): Payer: No Typology Code available for payment source

## 2023-07-04 VITALS — BP 122/82 | HR 64 | Ht 62.0 in | Wt 265.0 lb

## 2023-07-04 DIAGNOSIS — M79671 Pain in right foot: Secondary | ICD-10-CM

## 2023-07-04 DIAGNOSIS — M7731 Calcaneal spur, right foot: Secondary | ICD-10-CM | POA: Diagnosis not present

## 2023-07-04 DIAGNOSIS — M7662 Achilles tendinitis, left leg: Secondary | ICD-10-CM | POA: Diagnosis not present

## 2023-07-04 DIAGNOSIS — M7661 Achilles tendinitis, right leg: Secondary | ICD-10-CM | POA: Diagnosis not present

## 2023-07-04 DIAGNOSIS — M79672 Pain in left foot: Secondary | ICD-10-CM

## 2023-07-04 DIAGNOSIS — M7732 Calcaneal spur, left foot: Secondary | ICD-10-CM | POA: Diagnosis not present

## 2023-07-04 NOTE — Patient Instructions (Addendum)
Thank you for coming in today.   Please get an Xray today before you leave   Ice massage   Heel cushion.   Please complete the exercises that the athletic trainer went over with you:  View at [email or web address] using code: [email or web address]  Night splint.   Cam walker boot if needed.   Recheck about 1 week before your trip. I can do an injection at that time.

## 2023-07-04 NOTE — Progress Notes (Unsigned)
I, Stevenson Clinch, CMA acting as a scribe for Jordan Graham, MD.  Jordan Bush is a 52 y.o. female who presents to [facility] at [facility] today for bilat heel pain. Pt was previously seen by Dr. Denyse Amass on 02/28/22 for R lateral epicondylitis.  Today, pt c/o bilat heel pain x 6 months. Pt locates pain to bilateral heels, towards the back. Sx worse in the mornings and at night. Sx worse when barefoot. Feels better with ice. Short-term relief with tylenol or IBU. Notes sharp shooting pain into the feet and pain in the ankles. Swelling present in B LE. Has tried compression socks but that seemed to make sx worse. Hx of sciatica / lower back pain.   Aggravates:  Treatments tried:   Pertinent review of systems: No fevers or chills  Relevant historical information: History of prior Achilles tendinitis and plantar fasciitis   Exam:  BP 122/82   Pulse 64   Ht 5\' 2"  (1.575 m)   Wt 265 lb (120.2 kg)   LMP  (LMP Unknown)   SpO2 96%   BMI 48.47 kg/m  General: Well Developed, well nourished, and in no acute distress.   MSK: Feet bilaterally normal. Tender palpation plantar calcaneus bilaterally.  Normal foot and ankle motion.    Lab and Radiology Results No results found for this or any previous visit (from the past 72 hour(s)). DG Os Calcis Left  Result Date: 07/04/2023 CLINICAL DATA:  Bilateral heel pain for 6 months. EXAM: LEFT OS CALCIS - 2+ VIEW COMPARISON:  None Available. FINDINGS: Moderate plantar calcaneal spur, small Achilles tendon enthesophyte. No fracture. No erosion, periostitis or focal bone abnormality. Normal hindfoot alignment. Unremarkable soft tissues. IMPRESSION: Moderate plantar calcaneal spur and small Achilles tendon enthesophyte. Electronically Signed   By: Narda Rutherford M.D.   On: 07/04/2023 18:08   DG Os Calcis Right  Result Date: 07/04/2023 CLINICAL DATA:  Bilateral heel pain for 6 months. EXAM: RIGHT OS CALCIS - 2+ VIEW COMPARISON:   None Available. FINDINGS: Small plantar calcaneal spur. Small Achilles tendon enthesophyte. No fracture, erosion, periostitis or focal bone abnormality. Normal hindfoot alignment. No focal soft tissue abnormalities. IMPRESSION: Small plantar calcaneal spur and Achilles tendon enthesophyte. Electronically Signed   By: Narda Rutherford M.D.   On: 07/04/2023 18:07   MM 3D SCREENING MAMMOGRAM BILATERAL BREAST  Result Date: 07/04/2023 CLINICAL DATA:  Screening. EXAM: DIGITAL SCREENING BILATERAL MAMMOGRAM WITH TOMOSYNTHESIS AND CAD TECHNIQUE: Bilateral screening digital craniocaudal and mediolateral oblique mammograms were obtained. Bilateral screening digital breast tomosynthesis was performed. The images were evaluated with computer-aided detection. COMPARISON:  Previous exam(s). ACR Breast Density Category b: There are scattered areas of fibroglandular density. FINDINGS: There are no findings suspicious for malignancy. IMPRESSION: No mammographic evidence of malignancy. A result letter of this screening mammogram will be mailed directly to the patient. RECOMMENDATION: Screening mammogram in one year. (Code:SM-B-01Y) BI-RADS CATEGORY  1: Negative. Electronically Signed   By: Norva Pavlov M.D.   On: 07/04/2023 13:17     I, Jordan Bush, personally (independently) visualized and performed the interpretation of the heel images attached in this note.   Assessment and Plan: 52 y.o. female with bilateral plantar foot pain thought to be due to plantar fasciitis.  Plan to maximize conservative management including heel cushion eccentric exercises ice massage night splints.  Recheck in about a month.  At that time if not better consider steroid injection.  She is going to a conference in [place] in about  5 weeks so injection in a month would be reasonable.   PDMP not reviewed this encounter. Orders Placed This Encounter  Procedures   DG Os Calcis Right    Standing Status:   Future    Number of Occurrences:    1    Standing Expiration Date:   07/03/2024    Order Specific Question:   Reason for Exam (SYMPTOM  OR DIAGNOSIS REQUIRED)    Answer:   eval heel pain    Order Specific Question:   Is patient pregnant?    Answer:   No    Order Specific Question:   Preferred imaging location?    Answer:   [facility]   DG Os Calcis Left    Standing Status:   Future    Number of Occurrences:   1    Standing Expiration Date:   07/03/2024    Order Specific Question:   Reason for Exam (SYMPTOM  OR DIAGNOSIS REQUIRED)    Answer:   eval heel pain    Order Specific Question:   Is patient pregnant?    Answer:   No    Order Specific Question:   Preferred imaging location?    Answer:   [facility]   No orders of the defined types were placed in this encounter.    Discussed warning signs or symptoms. Please see discharge instructions. Patient expresses understanding.   The above documentation has been reviewed and is accurate and complete Jordan Bush, M.D.

## 2023-07-05 ENCOUNTER — Encounter: Payer: Self-pay | Admitting: Family Medicine

## 2023-07-05 NOTE — Progress Notes (Signed)
Left heel x-ray shows a medium heel spur at the bottom of the foot and a small heel spur at the back of the foot.

## 2023-07-05 NOTE — Telephone Encounter (Signed)
Forwarding to Dr. Corey to review and advise.  

## 2023-07-05 NOTE — Progress Notes (Signed)
Right heel x-ray shows a small heel spur at the bottom and back of the foot.

## 2023-08-02 ENCOUNTER — Ambulatory Visit: Payer: No Typology Code available for payment source | Admitting: Family Medicine

## 2023-08-02 VITALS — BP 114/84 | HR 78 | Ht 62.0 in | Wt 253.0 lb

## 2023-08-02 DIAGNOSIS — M79671 Pain in right foot: Secondary | ICD-10-CM

## 2023-08-02 DIAGNOSIS — M79672 Pain in left foot: Secondary | ICD-10-CM | POA: Diagnosis not present

## 2023-08-02 NOTE — Progress Notes (Signed)
   Rubin Payor, PhD, LAT, ATC acting as a scribe for [name], MD.  Jordan Bush is a 52 y.o. female who presents to [facility] at [facility] today for f/u bilateral heel pain. Pt was last seen by Dr. Denyse Amass on 07/04/23 and was advised to use heel cushioning, night splints, ice massage, and was taught HEP.  Today, pt reports she has been working on [employer]. Bilat heels are slightly improved. She has been wearing the night splints. She is now having an "achy" pain across the talocrural joints bilaterally.  Dx imaging: 07/04/23 R & L Os calcis XR  Pertinent review of systems: No fevers or chills  Relevant historical information: Hypertension   Exam:  BP 114/84   Pulse 78   Ht 5\' 2"  (1.575 m)   Wt 253 lb (114.8 kg)   LMP  (LMP Unknown)   SpO2 99%   BMI 46.27 kg/m  General: Well Developed, well nourished, and in no acute distress.   MSK: Feet bilaterally normal.  Normal gait       Assessment and Plan: 52 y.o. female with improving plantar fasciitis related foot pain.  She has a conference/trip coming up next week where she will be doing more walking.  We discussed the option of doing injections today and decided against it.  Talked about maximizing Tylenol and ibuprofen for pain if needed.  I could prescribe opiate-based pain medicines to [facility] if needed next week.  Check back as needed.  Continue home exercise program.   PDMP not reviewed this encounter. No orders of the defined types were placed in this encounter.  No orders of the defined types were placed in this encounter.    Discussed warning signs or symptoms. Please see discharge instructions. Patient expresses understanding.   The above documentation has been reviewed and is accurate and complete [name], M.D. Total encounter time 20 minutes including face-to-face time with the patient and, reviewing past medical record, and charting on the date of service.

## 2023-08-02 NOTE — Patient Instructions (Signed)
Thank you for coming in today.   Tylenol arthritis 2 pills every 8 hours and ibuprofen 800mg   (4 pills) every 8 hours is max dose.   Let me know if you need stronger pain medicines.   I think you will be ok.

## 2023-08-21 ENCOUNTER — Other Ambulatory Visit: Payer: Self-pay | Admitting: Family Medicine

## 2023-08-21 DIAGNOSIS — I10 Essential (primary) hypertension: Secondary | ICD-10-CM

## 2023-09-10 ENCOUNTER — Ambulatory Visit: Payer: No Typology Code available for payment source | Admitting: Physician Assistant

## 2023-09-10 DIAGNOSIS — Z23 Encounter for immunization: Secondary | ICD-10-CM

## 2023-09-10 NOTE — Progress Notes (Signed)
Pt received Influenza vaccination

## 2023-09-12 ENCOUNTER — Other Ambulatory Visit: Payer: Self-pay

## 2023-09-12 DIAGNOSIS — I10 Essential (primary) hypertension: Secondary | ICD-10-CM

## 2023-09-12 MED ORDER — HYDROCHLOROTHIAZIDE 25 MG PO TABS
25.0000 mg | ORAL_TABLET | Freq: Every day | ORAL | 1 refills | Status: DC
Start: 2023-09-12 — End: 2024-03-20

## 2023-10-10 ENCOUNTER — Ambulatory Visit
Admission: RE | Admit: 2023-10-10 | Discharge: 2023-10-10 | Disposition: A | Discharge status: Home / Self Care | Payer: No Typology Code available for payment source | Source: Ambulatory Visit | Attending: Family Medicine | Admitting: Family Medicine

## 2023-10-10 VITALS — BP 133/94 | HR 85 | Temp 98.8°F | Resp 18 | Ht 62.0 in | Wt 248.0 lb

## 2023-10-10 DIAGNOSIS — J309 Allergic rhinitis, unspecified: Secondary | ICD-10-CM | POA: Diagnosis not present

## 2023-10-10 DIAGNOSIS — R0981 Nasal congestion: Secondary | ICD-10-CM

## 2023-10-10 MED ORDER — METHYLPREDNISOLONE ACETATE 80 MG/ML IJ SUSP
80.0000 mg | Freq: Once | INTRAMUSCULAR | Status: AC
  Administered 2023-10-10: 80 mg via INTRAMUSCULAR

## 2023-10-10 MED ORDER — AZELASTINE-FLUTICASONE 137-50 MCG/ACT NA SUSP: 1.0000 | Freq: Two times a day (BID) | NASAL | 0 refills | Status: AC

## 2023-10-10 NOTE — ED Triage Notes (Signed)
Patient c/o possible sinus infection, nasal drainage, HA, congestion x 2 days.  Low grade fever of 99.1.  Does not feel well.  Patient has taken Mucinex DM, Xyzal.

## 2023-10-10 NOTE — Discharge Instructions (Signed)
Try the new nasal spray Dymista instead of Flonase.  It has a steroid plus antihistamine to better open up your nasal passages Drink lots of fluids You have been given a steroid shot Call return if not better in 7 to 10 days, or see your PCP
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
## 2023-11-16 ENCOUNTER — Encounter: Payer: Self-pay | Admitting: Family Medicine

## 2023-11-20 ENCOUNTER — Ambulatory Visit: Payer: No Typology Code available for payment source | Admitting: Family Medicine

## 2023-11-20 ENCOUNTER — Encounter: Payer: Self-pay | Admitting: Family Medicine

## 2023-11-20 VITALS — BP 130/82 | HR 75 | Temp 98.0°F | Resp 16 | Ht 62.0 in | Wt 248.0 lb

## 2023-11-20 DIAGNOSIS — F4310 Post-traumatic stress disorder, unspecified: Secondary | ICD-10-CM | POA: Diagnosis not present

## 2023-11-20 DIAGNOSIS — I10 Essential (primary) hypertension: Secondary | ICD-10-CM | POA: Diagnosis not present

## 2023-11-20 DIAGNOSIS — M545 Low back pain, unspecified: Secondary | ICD-10-CM

## 2023-11-20 MED ORDER — BUSPIRONE HCL 7.5 MG PO TABS
7.5000 mg | ORAL_TABLET | Freq: Two times a day (BID) | ORAL | 0 refills | Status: DC
Start: 2023-11-20 — End: 2024-01-22

## 2023-11-20 MED ORDER — KETOROLAC TROMETHAMINE 60 MG/2ML IM SOLN
60.0000 mg | Freq: Once | INTRAMUSCULAR | Status: AC
Start: 2023-11-20 — End: 2023-11-20
  Administered 2023-11-20: 60 mg via INTRAMUSCULAR

## 2023-11-20 NOTE — Patient Instructions (Addendum)
 Keep the diet clean and stay active.  Check your blood pressures 2-3 times per week, alternating the time of day you check it. If it is high, considering waiting 1-2 minutes and rechecking. If it gets higher, your anxiety is likely creeping up and we should avoid rechecking.   Coping skills Choose 5 that work for you: Take a deep breath Count to 20 Read a book Do a puzzle Meditate Bake Sing Knit Garden Pray Go outside Call a friend Listen to music Take a walk Color Send a note Take a bath Watch a movie Be alone in a quiet place Pet an animal Visit a friend Journal Exercise Stretch   Heat (pad or rice pillow in microwave) over affected area, 10-15 minutes twice daily.   Ice/cold pack over area for 10-15 min twice daily.  OK to take Tylenol  1000 mg (2 extra strength tabs) or 975 mg (3 regular strength tabs) every 6 hours as needed.  EXERCISES  RANGE OF MOTION (ROM) AND STRETCHING EXERCISES - Low Back Pain Most people with lower back pain will find that their symptoms get worse with excessive bending forward (flexion) or arching at the lower back (extension). The exercises that will help resolve your symptoms will focus on the opposite motion.  If you have pain, numbness or tingling which travels down into your buttocks, leg or foot, the goal of the therapy is for these symptoms to move closer to your back and eventually resolve. Sometimes, these leg symptoms will get better, but your lower back pain may worsen. This is often an indication of progress in your rehabilitation. Be very alert to any changes in your symptoms and the activities in which you participated in the 24 hours prior to the change. Sharing this information with your caregiver will allow him or her to most efficiently treat your condition. These exercises may help you when beginning to rehabilitate your injury. Your symptoms may resolve with or without further involvement from your physician, physical therapist  or athletic trainer. While completing these exercises, remember:  Restoring tissue flexibility helps normal motion to return to the joints. This allows healthier, less painful movement and activity. An effective stretch should be held for at least 30 seconds. A stretch should never be painful. You should only feel a gentle lengthening or release in the stretched tissue. FLEXION RANGE OF MOTION AND STRETCHING EXERCISES:  STRETCH - Flexion, Single Knee to Chest  Lie on a firm bed or floor with both legs extended in front of you. Keeping one leg in contact with the floor, bring your opposite knee to your chest. Hold your leg in place by either grabbing behind your thigh or at your knee. Pull until you feel a gentle stretch in your low back. Hold 30 seconds. Slowly release your grasp and repeat the exercise with the opposite side. Repeat 2 times. Complete this exercise 3 times per week.   STRETCH - Flexion, Double Knee to Chest Lie on a firm bed or floor with both legs extended in front of you. Keeping one leg in contact with the floor, bring your opposite knee to your chest. Tense your stomach muscles to support your back and then lift your other knee to your chest. Hold your legs in place by either grabbing behind your thighs or at your knees. Pull both knees toward your chest until you feel a gentle stretch in your low back. Hold 30 seconds. Tense your stomach muscles and slowly return one leg at a time  to the floor. Repeat 2 times. Complete this exercise 3 times per week.   STRETCH - Low Trunk Rotation Lie on a firm bed or floor. Keeping your legs in front of you, bend your knees so they are both pointed toward the ceiling and your feet are flat on the floor. Extend your arms out to the side. This will stabilize your upper body by keeping your shoulders in contact with the floor. Gently and slowly drop both knees together to one side until you feel a gentle stretch in your low back. Hold for  30 seconds. Tense your stomach muscles to support your lower back as you bring your knees back to the starting position. Repeat the exercise to the other side. Repeat 2 times. Complete this exercise at least 3 times per week.   EXTENSION RANGE OF MOTION AND FLEXIBILITY EXERCISES:  STRETCH - Extension, Prone on Elbows  Lie on your stomach on the floor, a bed will be too soft. Place your palms about shoulder width apart and at the height of your head. Place your elbows under your shoulders. If this is too painful, stack pillows under your chest. Allow your body to relax so that your hips drop lower and make contact more completely with the floor. Hold this position for 30 seconds. Slowly return to lying flat on the floor. Repeat 2 times. Complete this exercise 3 times per week.   RANGE OF MOTION - Extension, Prone Press Ups Lie on your stomach on the floor, a bed will be too soft. Place your palms about shoulder width apart and at the height of your head. Keeping your back as relaxed as possible, slowly straighten your elbows while keeping your hips on the floor. You may adjust the placement of your hands to maximize your comfort. As you gain motion, your hands will come more underneath your shoulders. Hold this position 30 seconds. Slowly return to lying flat on the floor. Repeat 2 times. Complete this exercise 3 times per week.   RANGE OF MOTION- Quadruped, Neutral Spine  Assume a hands and knees position on a firm surface. Keep your hands under your shoulders and your knees under your hips. You may place padding under your knees for comfort. Drop your head and point your tailbone toward the ground below you. This will round out your lower back like an angry cat. Hold this position for 30 seconds. Slowly lift your head and release your tail bone so that your back sags into a large arch, like an old horse. Hold this position for 30 seconds. Repeat this until you feel limber in your low  back. Now, find your sweet spot. This will be the most comfortable position somewhere between the two previous positions. This is your neutral spine. Once you have found this position, tense your stomach muscles to support your low back. Hold this position for 30 seconds. Repeat 2 times. Complete this exercise 3 times per week.   STRENGTHENING EXERCISES - Low Back Sprain These exercises may help you when beginning to rehabilitate your injury. These exercises should be done near your sweet spot. This is the neutral, low-back arch, somewhere between fully rounded and fully arched, that is your least painful position. When performed in this safe range of motion, these exercises can be used for people who have either a flexion or extension based injury. These exercises may resolve your symptoms with or without further involvement from your physician, physical therapist or athletic trainer. While completing these exercises, remember:  Muscles can gain both the endurance and the strength needed for everyday activities through controlled exercises. Complete these exercises as instructed by your physician, physical therapist or athletic trainer. Increase the resistance and repetitions only as guided. You may experience muscle soreness or fatigue, but the pain or discomfort you are trying to eliminate should never worsen during these exercises. If this pain does worsen, stop and make certain you are following the directions exactly. If the pain is still present after adjustments, discontinue the exercise until you can discuss the trouble with your caregiver.  STRENGTHENING - Deep Abdominals, Pelvic Tilt  Lie on a firm bed or floor. Keeping your legs in front of you, bend your knees so they are both pointed toward the ceiling and your feet are flat on the floor. Tense your lower abdominal muscles to press your low back into the floor. This motion will rotate your pelvis so that your tail bone is scooping  upwards rather than pointing at your feet or into the floor. With a gentle tension and even breathing, hold this position for 3 seconds. Repeat 2 times. Complete this exercise 3 times per week.   STRENGTHENING - Abdominals, Crunches  Lie on a firm bed or floor. Keeping your legs in front of you, bend your knees so they are both pointed toward the ceiling and your feet are flat on the floor. Cross your arms over your chest. Slightly tip your chin down without bending your neck. Tense your abdominals and slowly lift your trunk high enough to just clear your shoulder blades. Lifting higher can put excessive stress on the lower back and does not further strengthen your abdominal muscles. Control your return to the starting position. Repeat 2 times. Complete this exercise 3 times per week.   STRENGTHENING - Quadruped, Opposite UE/LE Lift  Assume a hands and knees position on a firm surface. Keep your hands under your shoulders and your knees under your hips. You may place padding under your knees for comfort. Find your neutral spine and gently tense your abdominal muscles so that you can maintain this position. Your shoulders and hips should form a rectangle that is parallel with the floor and is not twisted. Keeping your trunk steady, lift your right hand no higher than your shoulder and then your left leg no higher than your hip. Make sure you are not holding your breath. Hold this position for 30 seconds. Continuing to keep your abdominal muscles tense and your back steady, slowly return to your starting position. Repeat with the opposite arm and leg. Repeat 2 times. Complete this exercise 3 times per week.   STRENGTHENING - Abdominals and Quadriceps, Straight Leg Raise  Lie on a firm bed or floor with both legs extended in front of you. Keeping one leg in contact with the floor, bend the other knee so that your foot can rest flat on the floor. Find your neutral spine, and tense your abdominal  muscles to maintain your spinal position throughout the exercise. Slowly lift your straight leg off the floor about 6 inches for a count of 3, making sure to not hold your breath. Still keeping your neutral spine, slowly lower your leg all the way to the floor. Repeat this exercise with each leg 2 times. Complete this exercise 3 times per week.  POSTURE AND BODY MECHANICS CONSIDERATIONS - Low Back Sprain Keeping correct posture when sitting, standing or completing your activities will reduce the stress put on different body tissues, allowing injured tissues  a chance to heal and limiting painful experiences. The following are general guidelines for improved posture.  While reading these guidelines, remember: The exercises prescribed by your provider will help you have the flexibility and strength to maintain correct postures. The correct posture provides the best environment for your joints to work. All of your joints have less wear and tear when properly supported by a spine with good posture. This means you will experience a healthier, less painful body. Correct posture must be practiced with all of your activities, especially prolonged sitting and standing. Correct posture is as important when doing repetitive low-stress activities (typing) as it is when doing a single heavy-load activity (lifting).  RESTING POSITIONS Consider which positions are most painful for you when choosing a resting position. If you have pain with flexion-based activities (sitting, bending, stooping, squatting), choose a position that allows you to rest in a less flexed posture. You would want to avoid curling into a fetal position on your side. If your pain worsens with extension-based activities (prolonged standing, working overhead), avoid resting in an extended position such as sleeping on your stomach. Most people will find more comfort when they rest with their spine in a more neutral position, neither too rounded nor too  arched. Lying on a non-sagging bed on your side with a pillow between your knees, or on your back with a pillow under your knees will often provide some relief. Keep in mind, being in any one position for a prolonged period of time, no matter how correct your posture, can still lead to stiffness.  PROPER SITTING POSTURE In order to minimize stress and discomfort on your spine, you must sit with correct posture. Sitting with good posture should be effortless for a healthy body. Returning to good posture is a gradual process. Many people can work toward this most comfortably by using various supports until they have the flexibility and strength to maintain this posture on their own. When sitting with proper posture, your ears will fall over your shoulders and your shoulders will fall over your hips. You should use the back of the chair to support your upper back. Your lower back will be in a neutral position, just slightly arched. You may place a small pillow or folded towel at the base of your lower back for  support.  When working at a desk, create an environment that supports good, upright posture. Without extra support, muscles tire, which leads to excessive strain on joints and other tissues. Keep these recommendations in mind:  CHAIR: A chair should be able to slide under your desk when your back makes contact with the back of the chair. This allows you to work closely. The chair's height should allow your eyes to be level with the upper part of your monitor and your hands to be slightly lower than your elbows.  BODY POSITION Your feet should make contact with the floor. If this is not possible, use a foot rest. Keep your ears over your shoulders. This will reduce stress on your neck and low back.  INCORRECT SITTING POSTURES  If you are feeling tired and unable to assume a healthy sitting posture, do not slouch or slump. This puts excessive strain on your back tissues, causing more damage and  pain. Healthier options include: Using more support, like a lumbar pillow. Switching tasks to something that requires you to be upright or walking. Talking a brief walk. Lying down to rest in a neutral-spine position.  PROLONGED STANDING WHILE  SLIGHTLY LEANING FORWARD  When completing a task that requires you to lean forward while standing in one place for a long time, place either foot up on a stationary 2-4 inch high object to help maintain the best posture. When both feet are on the ground, the lower back tends to lose its slight inward curve. If this curve flattens (or becomes too large), then the back and your other joints will experience too much stress, tire more quickly, and can cause pain.  CORRECT STANDING POSTURES Proper standing posture should be assumed with all daily activities, even if they only take a few moments, like when brushing your teeth. As in sitting, your ears should fall over your shoulders and your shoulders should fall over your hips. You should keep a slight tension in your abdominal muscles to brace your spine. Your tailbone should point down to the ground, not behind your body, resulting in an over-extended swayback posture.   INCORRECT STANDING POSTURES  Common incorrect standing postures include a forward head, locked knees and/or an excessive swayback. WALKING Walk with an upright posture. Your ears, shoulders and hips should all line-up.  PROLONGED ACTIVITY IN A FLEXED POSITION When completing a task that requires you to bend forward at your waist or lean over a low surface, try to find a way to stabilize 3 out of 4 of your limbs. You can place a hand or elbow on your thigh or rest a knee on the surface you are reaching across. This will provide you more stability, so that your muscles do not tire as quickly. By keeping your knees relaxed, or slightly bent, you will also reduce stress across your lower back. CORRECT LIFTING TECHNIQUES  DO : Assume a wide  stance. This will provide you more stability and the opportunity to get as close as possible to the object which you are lifting. Tense your abdominals to brace your spine. Bend at the knees and hips. Keeping your back locked in a neutral-spine position, lift using your leg muscles. Lift with your legs, keeping your back straight. Test the weight of unknown objects before attempting to lift them. Try to keep your elbows locked down at your sides in order get the best strength from your shoulders when carrying an object.   Always ask for help when lifting heavy or awkward objects. INCORRECT LIFTING TECHNIQUES DO NOT:  Lock your knees when lifting, even if it is a small object. Bend and twist. Pivot at your feet or move your feet when needing to change directions. Assume that you can safely pick up even a paperclip without proper posture. Anxiety coping techniques provided.  Anxiety coping techniques provided.  Anxiety coping techniques provided.
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
## 2023-11-22 ENCOUNTER — Other Ambulatory Visit: Payer: Self-pay | Admitting: Family Medicine

## 2023-11-22 DIAGNOSIS — I10 Essential (primary) hypertension: Secondary | ICD-10-CM

## 2023-11-22 MED ORDER — AMLODIPINE BESYLATE 5 MG PO TABS
ORAL_TABLET | ORAL | 0 refills | Status: DC
Start: 2023-11-22 — End: 2024-03-03

## 2023-12-05 ENCOUNTER — Ambulatory Visit: Payer: No Typology Code available for payment source | Admitting: Obstetrics & Gynecology

## 2023-12-14 ENCOUNTER — Other Ambulatory Visit: Payer: Self-pay | Admitting: Family Medicine

## 2023-12-17 ENCOUNTER — Encounter: Payer: Self-pay | Admitting: Family Medicine

## 2023-12-19 ENCOUNTER — Encounter: Payer: Self-pay | Admitting: Obstetrics & Gynecology

## 2023-12-19 ENCOUNTER — Ambulatory Visit: Payer: No Typology Code available for payment source | Admitting: Obstetrics & Gynecology

## 2023-12-19 VITALS — BP 124/81 | HR 78 | Wt 245.0 lb

## 2023-12-19 DIAGNOSIS — Z01419 Encounter for gynecological examination (general) (routine) without abnormal findings: Secondary | ICD-10-CM | POA: Diagnosis not present

## 2023-12-19 DIAGNOSIS — Z1331 Encounter for screening for depression: Secondary | ICD-10-CM

## 2023-12-19 DIAGNOSIS — Z1211 Encounter for screening for malignant neoplasm of colon: Secondary | ICD-10-CM

## 2023-12-19 NOTE — Progress Notes (Signed)
 Subjective:     Jordan Bush is a 53 y.o. female here for a routine exam.  Current complaints: no GYN concerns. Pt has a family history of colon cancer in her mother and father (post mortem).      Gynecologic History No LMP recorded (lmp unknown). Patient has had a hysterectomy. Contraception: status post hysterectomy Last Pap: 2013. Results were: normal (no longer indicated) Last mammogram: 07/03/2023. Results were: normal  Obstetric History OB History  Gravida Para Term Preterm AB Living  4 2 2   2   SAB IAB Ectopic Multiple Live Births      2    # Outcome Date GA Lbr Len/2nd Weight Sex Type Anes PTL Lv  4 Term           3 Term           2 Gravida           1 Gravida              The following portions of the patient's history were reviewed and updated as appropriate: allergies, current medications, past family history, past medical history, past social history, past surgical history, and problem list.  Review of Systems Pertinent items are noted in HPI.    Objective:  BP 124/81 (BP Location: Left Arm, Patient Position: Sitting, Cuff Size: Large)   Pulse 78   Wt 245 lb (111.1 kg)   LMP  (LMP Unknown)   BMI 44.81 kg/m  General Appearance:    Alert, cooperative, no distress, appears stated age  Head:    Normocephalic, without obvious abnormality, atraumatic  Eyes:    conjunctiva/corneas clear, EOM's intact, both eyes  Ears:    Normal external ear canals, both ears  Nose:   Nares normal, septum midline, mucosa normal, no drainage    or sinus tenderness  Throat:   Lips, mucosa, and tongue normal; teeth and gums normal  Neck:   Supple, symmetrical, trachea midline, no adenopathy;    thyroid:  no enlargement/tenderness/nodules  Back:     Symmetric, no curvature, ROM normal, no CVA tenderness  Lungs:     respirations unlabored  Chest Wall:    No tenderness or deformity   Heart:    Regular rate and rhythm  Breast Exam:    No tenderness, masses, or nipple abnormality   Abdomen:     Soft, non-tender, bowel sounds active all four quadrants,    no masses, no organomegaly  Genitalia:    Normal female without lesion, discharge or tenderness   Uterus and cervix surgically removed.   Extremities:   Extremities normal, atraumatic, no cyanosis or edema  Pulses:   2+ and symmetric all extremities  Skin:   Skin color, texture, turgor normal, no rashes or lesions     Assessment:    Healthy female exam.    Plan:  Diagnoses and all orders for this visit:  Well female exam with routine gynecological exam  Colon cancer screening -     Ambulatory referral to Gastroenterology   F/u in 1 year or sooner prn   Zakkiyya Barno L. Harraway-Smith, M.D., FACOG

## 2023-12-20 ENCOUNTER — Ambulatory Visit
Admission: EM | Admit: 2023-12-20 | Discharge: 2023-12-20 | Disposition: A | Discharge status: Home / Self Care | Payer: No Typology Code available for payment source

## 2023-12-20 DIAGNOSIS — U071 COVID-19: Secondary | ICD-10-CM | POA: Diagnosis not present

## 2023-12-20 NOTE — Discharge Instructions (Signed)
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
## 2023-12-20 NOTE — ED Triage Notes (Signed)
 Pt took at home covid test Saturday that was positive. She presents today due to continued symptoms, lightheadedness, and dehydrated feeling.

## 2024-01-01 ENCOUNTER — Ambulatory Visit (INDEPENDENT_AMBULATORY_CARE_PROVIDER_SITE_OTHER): Payer: No Typology Code available for payment source | Admitting: Family Medicine

## 2024-01-01 ENCOUNTER — Encounter: Payer: Self-pay | Admitting: Family Medicine

## 2024-01-01 VITALS — BP 122/82 | HR 84 | Temp 98.0°F | Resp 16 | Ht 62.0 in | Wt 245.4 lb

## 2024-01-01 DIAGNOSIS — D649 Anemia, unspecified: Secondary | ICD-10-CM | POA: Diagnosis not present

## 2024-01-01 DIAGNOSIS — Z23 Encounter for immunization: Secondary | ICD-10-CM | POA: Diagnosis not present

## 2024-01-01 DIAGNOSIS — E7800 Pure hypercholesterolemia, unspecified: Secondary | ICD-10-CM

## 2024-01-01 DIAGNOSIS — Z0000 Encounter for general adult medical examination without abnormal findings: Secondary | ICD-10-CM

## 2024-01-01 LAB — COMPREHENSIVE METABOLIC PANEL
ALT: 17 U/L (ref 0–35)
AST: 14 U/L (ref 0–37)
Albumin: 4.3 g/dL (ref 3.5–5.2)
Alkaline Phosphatase: 65 U/L (ref 39–117)
BUN: 22 mg/dL (ref 6–23)
CO2: 29 meq/L (ref 19–32)
Calcium: 9.4 mg/dL (ref 8.4–10.5)
Chloride: 102 meq/L (ref 96–112)
Creatinine, Ser: 1.14 mg/dL (ref 0.40–1.20)
GFR: 55.68 mL/min — ABNORMAL LOW (ref >60.00)
Glucose, Bld: 98 mg/dL (ref 70–99)
Potassium: 3.9 meq/L (ref 3.5–5.1)
Sodium: 140 meq/L (ref 135–145)
Total Bilirubin: 0.7 mg/dL (ref 0.2–1.2)
Total Protein: 7.6 g/dL (ref 6.0–8.3)

## 2024-01-01 LAB — LIPID PANEL
Cholesterol: 208 mg/dL — ABNORMAL HIGH (ref 0–200)
HDL: 43.7 mg/dL (ref >39.00)
LDL Cholesterol: 137 mg/dL — ABNORMAL HIGH (ref 0–99)
NonHDL: 164.5
Total CHOL/HDL Ratio: 5
Triglycerides: 138 mg/dL (ref 0.0–149.0)
VLDL: 27.6 mg/dL (ref 0.0–40.0)

## 2024-01-01 LAB — CBC
HCT: 35.9 % — ABNORMAL LOW (ref 36.0–46.0)
Hemoglobin: 11.9 g/dL — ABNORMAL LOW (ref 12.0–15.0)
MCHC: 33.2 g/dL (ref 30.0–36.0)
MCV: 87 fL (ref 78.0–100.0)
Platelets: 278 10*3/uL (ref 150.0–400.0)
RBC: 4.13 Mil/uL (ref 3.87–5.11)
RDW: 14.3 % (ref 11.5–15.5)
WBC: 7.1 10*3/uL (ref 4.0–10.5)
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
## 2024-01-01 NOTE — Patient Instructions (Addendum)
 Give [name] 2-3 business days to get the results of your labs back.   Keep the diet clean and stay active.  Please consider adding some weight resistance exercise to your routine. Consider yoga as well.   Please get me a copy of your advanced directive form at your convenience.   The Shingrix vaccine (for shingles) is a 2 shot series spaced 2-6 months apart. It can make people feel low energy, achy and almost like they have the flu for 48 hours after injection. 1/5 people can have nausea and/or vomiting. Please plan accordingly when deciding on when to get this shot. Call our office for a nurse visit appointment to get this. The second shot of the series is less severe regarding the side effects, but it still lasts 48 hours.   Use Flonase and Astelin nasal sprays daily.   Let us know if you need anything.

## 2024-01-02 NOTE — Addendum Note (Signed)
 Addended by: Conrad Van Dyne D on: 01/02/2024 11:00 AM   Modules accepted: Orders

## 2024-01-18 ENCOUNTER — Other Ambulatory Visit (INDEPENDENT_AMBULATORY_CARE_PROVIDER_SITE_OTHER): Payer: No Typology Code available for payment source

## 2024-01-18 ENCOUNTER — Ambulatory Visit (INDEPENDENT_AMBULATORY_CARE_PROVIDER_SITE_OTHER): Payer: No Typology Code available for payment source

## 2024-01-18 ENCOUNTER — Encounter: Payer: Self-pay | Admitting: Family Medicine

## 2024-01-18 DIAGNOSIS — Z23 Encounter for immunization: Secondary | ICD-10-CM

## 2024-01-18 DIAGNOSIS — D649 Anemia, unspecified: Secondary | ICD-10-CM | POA: Diagnosis not present

## 2024-01-18 DIAGNOSIS — E7800 Pure hypercholesterolemia, unspecified: Secondary | ICD-10-CM | POA: Diagnosis not present

## 2024-01-18 LAB — CBC WITH DIFFERENTIAL/PLATELET
Basophils Absolute: 0 10*3/uL (ref 0.0–0.1)
Basophils Relative: 0.3 % (ref 0.0–3.0)
Eosinophils Absolute: 0.1 10*3/uL (ref 0.0–0.7)
Eosinophils Relative: 1.2 % (ref 0.0–5.0)
HCT: 37.9 % (ref 36.0–46.0)
Hemoglobin: 12.2 g/dL (ref 12.0–15.0)
Lymphocytes Relative: 26.6 % (ref 12.0–46.0)
Lymphs Abs: 2.1 10*3/uL (ref 0.7–4.0)
MCHC: 32.3 g/dL (ref 30.0–36.0)
MCV: 88.1 fl (ref 78.0–100.0)
Monocytes Absolute: 0.5 10*3/uL (ref 0.1–1.0)
Monocytes Relative: 5.8 % (ref 3.0–12.0)
Neutro Abs: 5.2 10*3/uL (ref 1.4–7.7)
Neutrophils Relative %: 66.1 % (ref 43.0–77.0)
Platelets: 281 10*3/uL (ref 150.0–400.0)
RBC: 4.3 Mil/uL (ref 3.87–5.11)
RDW: 14.4 % (ref 11.5–15.5)
WBC: 7.9 10*3/uL (ref 4.0–10.5)

## 2024-01-18 LAB — LIPID PANEL
Cholesterol: 214 mg/dL — ABNORMAL HIGH (ref 0–200)
HDL: 47 mg/dL (ref >39.00)
LDL Cholesterol: 144 mg/dL — ABNORMAL HIGH (ref 0–99)
NonHDL: 166.9
Total CHOL/HDL Ratio: 5
Triglycerides: 113 mg/dL (ref 0.0–149.0)
VLDL: 22.6 mg/dL (ref 0.0–40.0)

## 2024-01-18 LAB — IBC + FERRITIN
Ferritin: 205.9 ng/mL (ref 10.0–291.0)
Iron: 65 ug/dL (ref 42–145)
Saturation Ratios: 20.7 % (ref 20.0–50.0)
TIBC: 313.6 ug/dL (ref 250.0–450.0)
Transferrin: 224 mg/dL (ref 212.0–360.0)

## 2024-01-18 NOTE — Progress Notes (Signed)
 Patient here today for first shingles vaccine , tolerated well

## 2024-01-22 ENCOUNTER — Other Ambulatory Visit: Payer: Self-pay | Admitting: Family Medicine

## 2024-01-22 DIAGNOSIS — F4310 Post-traumatic stress disorder, unspecified: Secondary | ICD-10-CM

## 2024-01-22 MED ORDER — BUSPIRONE HCL 7.5 MG PO TABS: 7.5000 mg | ORAL_TABLET | Freq: Two times a day (BID) | ORAL | 2 refills | Status: AC

## 2024-01-22 MED ORDER — ROSUVASTATIN CALCIUM 5 MG PO TABS: 5.0000 mg | ORAL_TABLET | Freq: Every day | ORAL | 3 refills | Status: AC

## 2024-03-03 ENCOUNTER — Other Ambulatory Visit: Payer: Self-pay | Admitting: Family Medicine

## 2024-03-03 DIAGNOSIS — I10 Essential (primary) hypertension: Secondary | ICD-10-CM

## 2024-03-11 DIAGNOSIS — K5730 Diverticulosis of large intestine without perforation or abscess without bleeding: Secondary | ICD-10-CM | POA: Diagnosis not present

## 2024-03-11 DIAGNOSIS — Z1211 Encounter for screening for malignant neoplasm of colon: Secondary | ICD-10-CM | POA: Diagnosis not present

## 2024-03-11 DIAGNOSIS — Z800 Family history of malignant neoplasm of digestive organs: Secondary | ICD-10-CM | POA: Diagnosis not present

## 2024-03-20 ENCOUNTER — Other Ambulatory Visit: Payer: Self-pay | Admitting: Family Medicine

## 2024-03-20 DIAGNOSIS — I10 Essential (primary) hypertension: Secondary | ICD-10-CM

## 2024-04-19 ENCOUNTER — Encounter: Payer: Self-pay | Admitting: Family Medicine

## 2024-04-21 ENCOUNTER — Other Ambulatory Visit: Payer: Self-pay

## 2024-04-21 DIAGNOSIS — K635 Polyp of colon: Secondary | ICD-10-CM | POA: Diagnosis not present

## 2024-04-21 DIAGNOSIS — K5730 Diverticulosis of large intestine without perforation or abscess without bleeding: Secondary | ICD-10-CM | POA: Diagnosis not present

## 2024-04-21 DIAGNOSIS — Z800 Family history of malignant neoplasm of digestive organs: Secondary | ICD-10-CM | POA: Diagnosis not present

## 2024-04-21 DIAGNOSIS — G4730 Sleep apnea, unspecified: Secondary | ICD-10-CM | POA: Diagnosis not present

## 2024-04-21 DIAGNOSIS — Z1211 Encounter for screening for malignant neoplasm of colon: Secondary | ICD-10-CM | POA: Diagnosis not present

## 2024-04-21 DIAGNOSIS — I10 Essential (primary) hypertension: Secondary | ICD-10-CM

## 2024-04-21 DIAGNOSIS — D125 Benign neoplasm of sigmoid colon: Secondary | ICD-10-CM | POA: Diagnosis not present

## 2024-04-21 LAB — HM COLONOSCOPY

## 2024-04-21 MED ORDER — AMLODIPINE BESYLATE 5 MG PO TABS: ORAL_TABLET | ORAL | 1 refills | Status: DC

## 2024-04-24 ENCOUNTER — Other Ambulatory Visit: Payer: Self-pay | Admitting: Family Medicine

## 2024-04-24 DIAGNOSIS — I10 Essential (primary) hypertension: Secondary | ICD-10-CM

## 2024-04-24 MED ORDER — AMLODIPINE BESYLATE 5 MG PO TABS: 5.0000 mg | ORAL_TABLET | Freq: Two times a day (BID) | ORAL | 1 refills | Status: DC

## 2024-05-22 ENCOUNTER — Ambulatory Visit (INDEPENDENT_AMBULATORY_CARE_PROVIDER_SITE_OTHER)

## 2024-05-22 DIAGNOSIS — Z23 Encounter for immunization: Secondary | ICD-10-CM

## 2024-05-22 NOTE — Progress Notes (Signed)
 Pt here today for 2nd Shingrix  vaccine.   Shingrix  0.41mL injected into L deltoid IM. Pt tolerated injection well.

## 2024-06-30 ENCOUNTER — Ambulatory Visit: Payer: No Typology Code available for payment source | Admitting: Family Medicine

## 2024-06-30 ENCOUNTER — Other Ambulatory Visit (HOSPITAL_BASED_OUTPATIENT_CLINIC_OR_DEPARTMENT_OTHER): Payer: Self-pay | Admitting: Family Medicine

## 2024-06-30 ENCOUNTER — Ambulatory Visit: Payer: Self-pay | Admitting: Family Medicine

## 2024-06-30 ENCOUNTER — Encounter: Payer: Self-pay | Admitting: Family Medicine

## 2024-06-30 VITALS — BP 128/84 | HR 86 | Temp 98.0°F | Resp 16 | Ht 62.0 in | Wt 241.0 lb

## 2024-06-30 DIAGNOSIS — F411 Generalized anxiety disorder: Secondary | ICD-10-CM | POA: Diagnosis not present

## 2024-06-30 DIAGNOSIS — J3089 Other allergic rhinitis: Secondary | ICD-10-CM

## 2024-06-30 DIAGNOSIS — E7800 Pure hypercholesterolemia, unspecified: Secondary | ICD-10-CM

## 2024-06-30 DIAGNOSIS — Z87898 Personal history of other specified conditions: Secondary | ICD-10-CM

## 2024-06-30 DIAGNOSIS — Z1231 Encounter for screening mammogram for malignant neoplasm of breast: Secondary | ICD-10-CM

## 2024-06-30 DIAGNOSIS — I10 Essential (primary) hypertension: Secondary | ICD-10-CM | POA: Diagnosis not present

## 2024-06-30 DIAGNOSIS — J4521 Mild intermittent asthma with (acute) exacerbation: Secondary | ICD-10-CM | POA: Diagnosis not present

## 2024-06-30 LAB — COMPREHENSIVE METABOLIC PANEL WITH GFR
ALT: 19 U/L (ref 0–35)
AST: 21 U/L (ref 0–37)
Albumin: 4.2 g/dL (ref 3.5–5.2)
Alkaline Phosphatase: 63 U/L (ref 39–117)
BUN: 19 mg/dL (ref 6–23)
CO2: 27 meq/L (ref 19–32)
Calcium: 9.2 mg/dL (ref 8.4–10.5)
Chloride: 105 meq/L (ref 96–112)
Creatinine, Ser: 1.16 mg/dL (ref 0.40–1.20)
GFR: 54.34 mL/min — ABNORMAL LOW (ref >60.00)
Glucose, Bld: 90 mg/dL (ref 70–99)
Potassium: 4 meq/L (ref 3.5–5.1)
Sodium: 140 meq/L (ref 135–145)
Total Bilirubin: 0.5 mg/dL (ref 0.2–1.2)
Total Protein: 7 g/dL (ref 6.0–8.3)

## 2024-06-30 LAB — LIPID PANEL
Cholesterol: 156 mg/dL (ref 0–200)
HDL: 52.5 mg/dL (ref >39.00)
LDL Cholesterol: 81 mg/dL (ref 0–99)
NonHDL: 103.07
Total CHOL/HDL Ratio: 3
Triglycerides: 111 mg/dL (ref 0.0–149.0)
VLDL: 22.2 mg/dL (ref 0.0–40.0)

## 2024-06-30 MED ORDER — FLUTICASONE PROPIONATE HFA 110 MCG/ACT IN AERO
INHALATION_SPRAY | RESPIRATORY_TRACT | 2 refills | Status: DC
Start: 2024-06-30 — End: 2024-07-02

## 2024-06-30 MED ORDER — EPINEPHRINE 0.3 MG/0.3ML IJ SOAJ: 0.3000 mg | Freq: Once | INTRAMUSCULAR | 0 refills | Status: AC

## 2024-06-30 NOTE — Patient Instructions (Signed)
 Give us  2-3 business days to get the results of your labs back.   Keep the diet clean and stay active.  Send me a message if you wish to increase your BuSpar  dosage.   Let us  know if you need anything.
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
## 2024-07-01 ENCOUNTER — Telehealth: Payer: Self-pay

## 2024-07-01 ENCOUNTER — Other Ambulatory Visit (HOSPITAL_COMMUNITY): Payer: Self-pay

## 2024-07-01 NOTE — Telephone Encounter (Signed)
 Pharmacy Patient Advocate Encounter   Received notification from Patient Pharmacy that prior authorization for Fluticasone  25mcg/act nasal spray is required/requested.   Insurance verification completed.   The patient is insured through [employer] .   Per test claim:  Product/Service not covered - Plan/Benefit Exclusion. OTC's not covered. The Fluticasone  nasal spray is available OTC as Flonase  and the generic.

## 2024-07-01 NOTE — Telephone Encounter (Signed)
 Message sent to pt.

## 2024-07-02 ENCOUNTER — Other Ambulatory Visit: Payer: Self-pay

## 2024-07-02 DIAGNOSIS — J4521 Mild intermittent asthma with (acute) exacerbation: Secondary | ICD-10-CM

## 2024-07-02 MED ORDER — ALBUTEROL SULFATE HFA 108 (90 BASE) MCG/ACT IN AERS: 1.0000 | INHALATION_SPRAY | Freq: Four times a day (QID) | RESPIRATORY_TRACT | 2 refills | Status: AC | PRN

## 2024-07-02 MED ORDER — FLUTICASONE PROPIONATE 50 MCG/ACT NA SUSP: 2.0000 | Freq: Every day | NASAL | 1 refills | Status: AC

## 2024-07-02 MED ORDER — FLUTICASONE PROPIONATE HFA 110 MCG/ACT IN AERO: INHALATION_SPRAY | RESPIRATORY_TRACT | 2 refills | Status: AC

## 2024-07-15 ENCOUNTER — Ambulatory Visit (HOSPITAL_BASED_OUTPATIENT_CLINIC_OR_DEPARTMENT_OTHER)
Admission: RE | Admit: 2024-07-15 | Discharge: 2024-07-15 | Disposition: A | Discharge status: Home / Self Care | Source: Ambulatory Visit | Attending: Family Medicine | Admitting: Family Medicine

## 2024-07-15 ENCOUNTER — Encounter (HOSPITAL_BASED_OUTPATIENT_CLINIC_OR_DEPARTMENT_OTHER): Payer: Self-pay

## 2024-07-15 DIAGNOSIS — Z1231 Encounter for screening mammogram for malignant neoplasm of breast: Secondary | ICD-10-CM | POA: Diagnosis not present

## 2024-07-17 DIAGNOSIS — F411 Generalized anxiety disorder: Secondary | ICD-10-CM | POA: Diagnosis not present

## 2024-08-06 ENCOUNTER — Other Ambulatory Visit: Payer: Self-pay | Admitting: Family Medicine

## 2024-08-06 DIAGNOSIS — I10 Essential (primary) hypertension: Secondary | ICD-10-CM

## 2024-08-14 DIAGNOSIS — F4323 Adjustment disorder with mixed anxiety and depressed mood: Secondary | ICD-10-CM | POA: Diagnosis not present

## 2024-08-28 DIAGNOSIS — F4323 Adjustment disorder with mixed anxiety and depressed mood: Secondary | ICD-10-CM | POA: Diagnosis not present

## 2024-09-11 DIAGNOSIS — F4323 Adjustment disorder with mixed anxiety and depressed mood: Secondary | ICD-10-CM | POA: Diagnosis not present

## 2024-09-25 DIAGNOSIS — F4323 Adjustment disorder with mixed anxiety and depressed mood: Secondary | ICD-10-CM | POA: Diagnosis not present

## 2024-09-29 NOTE — Progress Notes (Unsigned)
   Jordan Ileana Collet, Jordan Bush, Jordan Bush, Jordan Bush acting as a scribe for Artist Lloyd, MD.  Jordan Bush is a 53 y.o. female who presents to [facility] at [facility] today for R heel pain. Pt was last seen by Dr. Lloyd on 08/02/23 for bilat heel pain.  Today, pt c/o R heel pain x 6-wks+. Pt locates pain to the posterior and slightly plantar aspect of her R heel.   Treatments tried: stretching, postop shoe  Pertinent review of systems: No fevers or chills  Relevant historical information: Plantar fasciitis and Achilles tendinitis.  Hypertension.   Exam:  BP 126/82   Pulse 73   Ht 5' 2 (1.575 m)   Wt 246 lb (111.6 kg)   LMP  (LMP Unknown)   SpO2 96%   BMI 44.99 kg/m  General: Well Developed, well nourished, and in no acute distress.   MSK: Right foot normal-appearing. Tender palpation posterior calcaneus.  Normal foot and ankle motion.  Intact strength.         Assessment and Plan: 53 y.o. female with right heel pain due to chronic calcific Achilles tendinitis.  Plan to reemphasize home exercise program.  Additionally will use diclofenac  gel and will add meloxicam .  Continue night splint.  If not improved consider nitroglycerin  patch protocol and shockwave.   PDMP not reviewed this encounter. No orders of the defined types were placed in this encounter.  Meds ordered this encounter  Medications   meloxicam  (MOBIC ) 15 MG tablet    Sig: Take 1 tablet (15 mg total) by mouth daily as needed for pain.    Dispense:  15 tablet    Refill:  0     Discussed warning signs or symptoms. Please see discharge instructions. Patient expresses understanding.   The above documentation has been reviewed and is accurate and complete Artist Lloyd, M.D.

## 2024-09-30 ENCOUNTER — Other Ambulatory Visit: Payer: Self-pay

## 2024-09-30 ENCOUNTER — Ambulatory Visit: Admitting: Family Medicine

## 2024-09-30 VITALS — BP 126/82 | HR 73 | Ht 62.0 in | Wt 246.0 lb

## 2024-09-30 DIAGNOSIS — M7661 Achilles tendinitis, right leg: Secondary | ICD-10-CM

## 2024-09-30 MED ORDER — MELOXICAM 15 MG PO TABS: 15.0000 mg | ORAL_TABLET | Freq: Every day | ORAL | 0 refills | Status: AC | PRN

## 2024-09-30 NOTE — Patient Instructions (Addendum)
 Thank you for coming in today.   Please work on the home exercises the athletic trainer went over with you:  View at [email or web address] code [phone]  Please use Voltaren  gel (Generic Diclofenac  Gel) up to 4x daily for pain as needed.  This is available over-the-counter as both the name brand Voltaren  gel and the generic diclofenac  gel.   If not better let me know and I can prescribed nitroglycerin  patches  Check back as needed

## 2024-10-06 DIAGNOSIS — F4323 Adjustment disorder with mixed anxiety and depressed mood: Secondary | ICD-10-CM | POA: Diagnosis not present

## 2024-10-24 ENCOUNTER — Ambulatory Visit
Admission: RE | Admit: 2024-10-24 | Discharge: 2024-10-24 | Disposition: A | Discharge status: Home / Self Care | Source: Ambulatory Visit | Attending: Family Medicine | Admitting: Family Medicine

## 2024-10-24 ENCOUNTER — Other Ambulatory Visit: Payer: Self-pay

## 2024-10-24 VITALS — BP 135/86 | HR 72 | Temp 98.0°F | Resp 18 | Ht 62.0 in | Wt 250.0 lb

## 2024-10-24 DIAGNOSIS — H6501 Acute serous otitis media, right ear: Secondary | ICD-10-CM | POA: Diagnosis not present

## 2024-10-24 MED ORDER — AMOXICILLIN-POT CLAVULANATE 875-125 MG PO TABS: 1.0000 | ORAL_TABLET | Freq: Two times a day (BID) | ORAL | 0 refills | Status: DC

## 2024-10-24 NOTE — ED Triage Notes (Signed)
 Pt presents with a chief complaint of sharp pains in right ear. Began yesterday and its been constant since. OTC Ibuprofen  taken last night with improvement. Currently rates overall ear pain a 7/10. Pain in ear is beginning to make head hurt.
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
## 2024-10-24 NOTE — Discharge Instructions (Addendum)
 Advised patient take medication as directed with food to completion.  Encouraged increase daily water intake to 64 ounces per day while taking this medication.  Advised if symptoms worsen and/or unresolved please follow-up with your PCP, ENT, or here for further evaluation.

## 2024-10-27 ENCOUNTER — Encounter: Payer: Self-pay | Admitting: Family Medicine

## 2024-10-27 ENCOUNTER — Ambulatory Visit: Admitting: Family Medicine

## 2024-10-27 VITALS — BP 122/80 | HR 100 | Temp 98.0°F | Resp 16 | Ht 62.0 in | Wt 252.0 lb

## 2024-10-27 DIAGNOSIS — L308 Other specified dermatitis: Secondary | ICD-10-CM

## 2024-10-27 DIAGNOSIS — Z23 Encounter for immunization: Secondary | ICD-10-CM | POA: Diagnosis not present

## 2024-10-27 MED ORDER — TRIAMCINOLONE ACETONIDE 0.1 % EX CREA: TOPICAL_CREAM | CUTANEOUS | 0 refills | Status: DC

## 2024-10-27 NOTE — Patient Instructions (Addendum)
 Try not to scratch as this can make things worse. Avoid scented products while dealing with this. You may resume when the itchiness resolves. Cold/cool compresses can help.   Consider Aquaphor, Aveeno, Lubriderm, Vaseline, etc.  Consider black cohosh and/or soy products for menopausal symptoms.   Let us  know if you need anything.
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
## 2024-10-29 DIAGNOSIS — Z0289 Encounter for other administrative examinations: Secondary | ICD-10-CM

## 2024-11-17 ENCOUNTER — Other Ambulatory Visit: Payer: Self-pay

## 2024-11-17 DIAGNOSIS — I10 Essential (primary) hypertension: Secondary | ICD-10-CM

## 2024-11-17 MED ORDER — HYDROCHLOROTHIAZIDE 25 MG PO TABS: 25.0000 mg | ORAL_TABLET | Freq: Every day | ORAL | 1 refills | Status: AC

## 2024-12-01 ENCOUNTER — Ambulatory Visit (INDEPENDENT_AMBULATORY_CARE_PROVIDER_SITE_OTHER): Admitting: Family Medicine

## 2024-12-01 ENCOUNTER — Encounter (INDEPENDENT_AMBULATORY_CARE_PROVIDER_SITE_OTHER): Payer: Self-pay | Admitting: Family Medicine

## 2024-12-01 VITALS — BP 105/71 | HR 84 | Temp 98.3°F | Ht 63.0 in | Wt 248.0 lb

## 2024-12-01 DIAGNOSIS — E88819 Insulin resistance, unspecified: Secondary | ICD-10-CM | POA: Diagnosis not present

## 2024-12-01 DIAGNOSIS — R0602 Shortness of breath: Secondary | ICD-10-CM

## 2024-12-01 DIAGNOSIS — F411 Generalized anxiety disorder: Secondary | ICD-10-CM | POA: Diagnosis not present

## 2024-12-01 DIAGNOSIS — Z1331 Encounter for screening for depression: Secondary | ICD-10-CM

## 2024-12-01 DIAGNOSIS — R5383 Other fatigue: Secondary | ICD-10-CM | POA: Diagnosis not present

## 2024-12-01 DIAGNOSIS — G4733 Obstructive sleep apnea (adult) (pediatric): Secondary | ICD-10-CM | POA: Diagnosis not present

## 2024-12-01 DIAGNOSIS — I10 Essential (primary) hypertension: Secondary | ICD-10-CM | POA: Diagnosis not present

## 2024-12-01 DIAGNOSIS — Z6841 Body Mass Index (BMI) 40.0 and over, adult: Secondary | ICD-10-CM | POA: Diagnosis not present

## 2024-12-01 DIAGNOSIS — E7800 Pure hypercholesterolemia, unspecified: Secondary | ICD-10-CM | POA: Diagnosis not present

## 2024-12-01 NOTE — Assessment & Plan Note (Addendum)
 Has buspirone  7.5mg  that she takes daily and feels this is managing her anxiety well.  She has buspar  written twice a day.

## 2024-12-01 NOTE — Assessment & Plan Note (Addendum)
 On amlodipine  and hydrochlorothiazide .  She started medication around 2008.  Originally started with lisinopril but change occurred to current medications.  Blood pressure normally well controlled on her current regimen.

## 2024-12-01 NOTE — Progress Notes (Signed)
 "  Chief Complaint:  Obesity   Subjective:  Jordan Bush (MR# [id number]) is a 54 y.o. female who presents for evaluation and treatment of obesity and related comorbidities.   Jordan Bush is currently in the action stage of change and ready to dedicate time achieving and maintaining a healthier weight. Jordan Bush is interested in becoming our patient and working on intensive lifestyle modifications including (but not limited to) diet and exercise for weight loss.  Jordan Bush has been struggling with her weight. She has been unsuccessful in either losing weight, maintaining weight loss, or reaching her healthy weight goal.  She previously participated in this program in 2023 and mentions she is not sure why she took time away.  She has been trying to stay motivated with her physical activity and consistent even in the winter months.  She has changed her eating habits.  She can recognize that when she started seeing a new partner he would cook and it was not the most nutritious.  She has been trying to get more in line with the nutrition she knows she needs. She is lactose sensitive- can eat cheese but doesn't tolerate milk.   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  Works in an office 8-4:30 M-F.  Desired weight is 200-215lbs.  She was down to 210 in July of 2023.  She recognizes she does emotionally eat but isn't always aware she is eating emotionally when she is doing it. Skips breakfast/ lunch frequently due to food availability at home.   Food recall: 2 boiled eggs in the am, half slice of cheese, half avocado and a small piece of sausage. Felt satisfied.  Snack of yogurt a couple of hours later.  She wanted a snack.  Lunch was turkey and cheese sandwich (6 thin sliced turkey and a slice of cheese) and 45 calorie bread- felt satisfied.  Patient had a protein bar in her bag that she ate for a snack an hour  later.  Dinner around 6pm zucchini, peppers, onions, garlic, 1/2 cup rice, mushrooms, spinach and 1 cup ground turkey. Felt full.    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 Career Navigator  Do you work outside the home? Yes  If working outside the home, how many hours per week do you work? 40  What is your current relationship status? Divorced  Please list anyone who lives with you currently(Name, age, and relationship) n/a  Do you currently smoke tobacco? 0  Have you ever smoked tobacco? No  Did you quit smoking? No  Have you ever had a problem with the following:    Chl Mychart [facility] Epworth And Sleep Eval Stw Questionnaire  Question 11/30/2024 11:10 PM EST - Filed by Patient  Sitting and reading? High chance of dozing  Watching TV? Moderate chance of dozing  Sitting, inactive in a public place (e.g. a theatre...or a meeting)? Slight chance of dozing  As a passenger in a car for an hour without a break? Slight chance of dozing  Lying down to rest in the afternoon when circumstances permit? Slight chance of dozing  Sitting and talking to someone? Would never doze  Sitting quietly after lunch (without alcohol) Moderate chance of dozing  In a car, while stopped for a few minutes in traffic? Would never doze  Epworth Total  Score (range: 0.00 - 27.00) Incomplete   Chl Mychart [facility] Mood & Food Questionnaire  Question 11/30/2024 11:12 PM EST - Filed by Patient  Do you tend to eat(select all that apply): When you are stressed   When you are bored   When you are sad   When you feel angry   To help comfort yourself  Do you tend to hide or sneak foods so that others don't know you are eating? No  Do you feel guilt or self-hate after you overeat or make poor food choices? No  Did you grow up feeling judged about your weight or eating? no  Do you feel out of control about your eating? no   Do you eat quickly then feel uncomfortably full? no  When you overeat do you feel like you've blown it and give up eating properly? No  Do you feel you have an all or nothing personality? no  Have you ever eaten so much food you wanted to throw up to feel better? yes  How often do you feel you eat a larger portion than the average person?   Have you ever eaten more than you intended to and didn't even realize it? Yes  How frequently do you think it happens that you eat more than you intended without realizing it? yes  Have you ever been diagnosed by a physician with an eating disorder? no  Do you suspect you currently have an eating disorder? No  Do you think you had any eating disorders in the past? No   Chl Mychart [facility] Weight Hx Questionnaire  Question 11/30/2024 11:21 PM EST - Filed by Patient  What is your current weight? 250  What is your height? 5'2  What is your desired weight? 200-215  In what time frame would you like to be at your desired weight? 1 year  Why did you decide on that desired weight (Dr. Recommended, weight charts, high school weight, etc.)? 200-215  What is your main reason for wanting to lose weight? healthy  What was your weight at birth? ?  What was your weight at 54 years old? 175?  What was your weight last year? 220  Have you been overweight most of your life? yes  When did you start gaining excessive weight? during pregnancy  Have you identified reasons why you gain weight? Pregnancy s  What is the heaviest you have ever weighed(not including pregnancy)? 278  Would you consider yourself a yo-yo dieter? no  Have you lost weight recently? Yes  If you have lost weight recently, how much have you lost? 8 lbs  If you have lost weight recently, how long did it take you to lose? 2 weeks  If you have lost weight recently, have you been able to keep most(at least 90%) of the weight off? 2 weeks  If you have lost weight recently, for how long have you kept  the weight off? 2 weeks  Have you tried any diets in the past? No  Do you feel like your weight is causing your energy to be lower than it should be? Yes  Do you get out of breath more easily than you used to when you exercise? Yes  Have you ever been on a weight loss medication? Yes  Which weight loss medications have you been on? Phentermine    Metformin    Mounjaro   How much weight have you lost? 65 lbs  Do you currently exercise? Yes  What kind of exercise  do you do? walk, some weights  How often do you exercise? 1-3 times weekly  For how many minutes do you exercise each time? 30 mins - 60 mins  Do you wear a fitness tracker? Yes  How many steps do you do on average per day? 1-3 miles  Is your family supportive of your weight loss efforts? yes  Does your family eat any meals together? no  Will your family be eating healthier with you, regardless of their weight? n/a  Do you anticipate any family members sabotaging your weight loss efforts, either on purpose or subconciously? n/a  Have you ever considered weight loss surgery? No  Why haven't you ever considered weight loss surgery? no  Have you had weight loss surgery? No  What was the date of your weight loss surgery? no  Have you had any other surgeries? If so, please list which surgery and approximate date. n/a  Do you feel you sleep well most nights? No  How many hours of sleep do you get most nights? 4-6  Do you usually wake refreshed or still tired? no  Do you snore? yes  Do you have morning headaches? Yes  How many times per week do you have morning headaches? 1-2  Have you ever woken up gasping for air? yes  Has anyone ever said you stop breathing in your sleep? yes   Chl Mychart [facility] Nutritional Evaluation Questionnaire  Question 11/30/2024 11:27 PM EST - Filed by Patient  How many times per week do you eat outside the home? 1-2  How many times per week do you eat fast food or take out? 1-2  Who does most of the grocery  shopping? 1-2  How often do you grocery shop? 1 - 2  Do you use a grocery list? yes  Do you like to cook? yes  What are your obstacles to cooking? being in the kitchen long  What foods do you crave?   Is there a time of day you crave food more than others?   What are your food dislikes? cold, greasy  Do you consider yourself a picky eater? no  Do you snack frequently between meals(more than once per day)? yes  Do you snack frequently at night(after dinner)? no  What foods do you tend to snack on? sweet/ salty  Do you wake up in the middle of the night hungry? No  Do you skip meals? No  Has eating healthier been financially difficult?   Are you trying to be vegetarian or vegan? No  Does your family make eating healthy difficult? Please Explain. no  Do your children's food preferences frequently dictate what the family eats?   Do you drink regular soda?   Do you drink coffee?   Do you drink milk?   Do you drink juice?   Do you drink tea? Yes  How do you drink your tea? with sweeteners  Do you drink smoothies? Yes  What type of smoothies do you drink? fruit  Do you drink protein drinks? No  Do you drink alcohol? Yes  What type of alcohol do you drink?   How many drinks of alcohol do you normally have and how often? 2 / weekly / biweekly  Do you use sugar substitutes? No  What do you consider your worst food habits? n/a  Do you feel like you struggle with poor food choices? no  Do you have excessive hunger? no  Do you have a problem with portion control?  no  Do you have a difficult time knowing when you are comfortably full?   Do you tend to eat until you are stuffed? no  Do you eat quickly(eat a meal in 20 minutes or less)? yes  Do you frequently eat while doing other things(working or in front of the TV)? yes  Have you been told in the past by a physician you need to limit your:     Indirect Calorimeter completed today shows a RMR: 2218.  Her calculated basal metabolic rate is  1867 thus her basal metabolic rate is better than expected.  Other Fatigue Sanaiya admits to daytime somnolence and admits to waking up still tired. Patient has a history of symptoms of morning headache. Jordan Bush generally gets 4-6 hours of sleep per night, and states that she has generally restless sleep. Snoring is present. Apneic episodes are present. Epworth Sleepiness Score is 11.   Shortness of Breath Jordan Bush notes increasing shortness of breath with exercising and seems to be worsening over time with weight gain. She notes getting out of breath sooner with activity than she used to. This has not gotten worse recently. Jordan Bush denies shortness of breath at rest or orthopnea.  Depression Screen Jordan Bush's Food and Mood (modified PHQ-9) score was 11.     12/01/2024    8:17 AM  Depression screen PHQ 2/9  Decreased Interest 1  Down, Depressed, Hopeless 1  PHQ - 2 Score 2  Altered sleeping 3  Tired, decreased energy 2  Change in appetite 1  Feeling bad or failure about yourself  0  Trouble concentrating 1  Moving slowly or fidgety/restless 0  Suicidal thoughts 0  PHQ-9 Score 9  Difficult doing work/chores Somewhat difficult     Objective:  Vitals Temp: 98.3 F (36.8 C) BP: 105/71 Pulse Rate: 84 SpO2: 99 %   Anthropometric Measurements Height: 5' 3 (1.6 m) Weight: 248 lb (112.5 kg) BMI (Calculated): 43.94 Weight at Last Visit: 210 lb Weight Lost Since Last Visit: 0 Weight Gained Since Last Visit: 38 lb Starting Weight: 266 lb Waist Measurement : 43 inches   Body Composition  Body Fat %: 46.7 % Fat Mass (lbs): 116 lbs Muscle Mass (lbs): 125.8 lbs Total Body Water  (lbs): 88.8 lbs Visceral Fat Rating : 15   Other Clinical Data RMR: 2218 Fasting: yes Labs: yes Starting Date: 09/22/19 Comments: restart 06/01/22    EKG: Normal sinus rhythm, rate 75 bpm.  General: Cooperative, alert, well developed, in no acute distress. HEENT: Conjunctivae and lids  unremarkable. Cardiovascular: Regular rhythm.  Lungs: Normal work of breathing. Neurologic: No focal deficits.   Lab Results  Component Value Date   CREATININE 1.16 06/30/2024   BUN 19 06/30/2024   NA 140 06/30/2024   K 4.0 06/30/2024   CL 105 06/30/2024   CO2 27 06/30/2024   Lab Results  Component Value Date   ALT 19 06/30/2024   AST 21 06/30/2024   ALKPHOS 63 06/30/2024   BILITOT 0.5 06/30/2024   Lab Results  Component Value Date   HGBA1C 5.4 06/01/2022   HGBA1C 5.3 05/30/2021   HGBA1C 5.5 08/16/2020   HGBA1C 5.2 09/22/2019   HGBA1C 5.6 06/27/2017   Lab Results  Component Value Date   INSULIN  4.0 06/01/2022   INSULIN  10.3 05/30/2021   INSULIN  17.0 03/10/2020   INSULIN  32.8 (H) 09/22/2019   Lab Results  Component Value Date   TSH 0.940 09/22/2019   Lab Results  Component Value Date   CHOL 156 06/30/2024  HDL 52.50 06/30/2024   LDLCALC 81 06/30/2024   TRIG 111.0 06/30/2024   CHOLHDL 3 06/30/2024   Lab Results  Component Value Date   WBC 7.9 01/18/2024   HGB 12.2 01/18/2024   HCT 37.9 01/18/2024   MCV 88.1 01/18/2024   PLT 281.0 01/18/2024   Lab Results  Component Value Date   IRON 65 01/18/2024   TIBC 313.6 01/18/2024   FERRITIN 205.9 01/18/2024    Assessment and Plan:   Other Fatigue  Jordan Bush does feel that her weight is causing her energy to be lower than it should be. Fatigue may be related to obesity, depression or many other causes. Labs will be ordered, and in the meanwhile, Jordan Bush will focus on self care including making healthy food choices, increasing physical activity and focusing on stress reduction.  Shortness of Breath  Jordan Bush does feel that she gets out of breath more easily that she used to when she exercises. Jordan Bush's shortness of breath appears to be obesity related and exercise induced. She has agreed to work on weight loss and gradually increase exercise to treat her exercise induced shortness of breath. Will continue to  monitor closely. Assessment & Plan Other fatigue  SOBOE (shortness of breath on exertion)  Depression screening  OSA on CPAP Has CPAP at home.  Does not use consistently.  Most recent sleep study from March 2020 showing AHI of 18.9 with lowest SaO2 84% with average 94%.  Mentions her OSA worsens with weight gain.  Will follow up on sleep at upcoming appointments. Primary hypertension On amlodipine  and hydrochlorothiazide .  She started medication around 2008.  Originally started with lisinopril but change occurred to current medications.  Blood pressure normally well controlled on her current regimen. Pure hypercholesterolemia Takes rosuvastatin  5mg  daily.  Denies any side effects- FLP ordered today.  GAD (generalized anxiety disorder) Has buspirone  7.5mg  that she takes daily and feels this is managing her anxiety well.  She has buspar  written twice a day.  Insulin  resistance Historical diagnosis from patient's prior time in the program.  Will repeat A1c and Insulin  level today to reevaluate status. BMI 40.0-44.9, adult (HCC)  Morbid obesity (HCC)     Problem List Items Addressed This Visit       Cardiovascular and Mediastinum   Primary hypertension   On amlodipine  and hydrochlorothiazide .  She started medication around 2008.  Originally started with lisinopril but change occurred to current medications.  Blood pressure normally well controlled on her current regimen.      Relevant Orders   Comprehensive metabolic panel with GFR     Other   GAD (generalized anxiety disorder)   Has buspirone  7.5mg  that she takes daily and feels this is managing her anxiety well.  She has buspar  written twice a day.       Other Visit Diagnoses       Other fatigue    -  Primary   Relevant Orders   EKG 12-Lead   Vitamin B12   T4, free   T3   Folate   VITAMIN D  25 Hydroxy (Vit-D Deficiency, Fractures)   TSH     SOBOE (shortness of breath on exertion)       Relevant Orders   EKG  12-Lead     Depression screening         OSA on CPAP       Relevant Orders   CBC with Differential/Platelet     Pure hypercholesterolemia       Relevant  Orders   Lipid Panel With LDL/HDL Ratio     Insulin  resistance       Relevant Orders   Hemoglobin A1c   Insulin , random     BMI 40.0-44.9, adult (HCC)         Morbid obesity (HCC)           Jordan Bush is currently in the action stage of change and her goal is to continue with weight loss efforts. I recommend Jette begin the structured treatment plan as follows:  She has agreed to Category 4 Plan  Exercise goals: For substantial health benefits, adults should do at least 150 minutes (2 hours and 30 minutes) a week of moderate-intensity, or 75 minutes (1 hour and 15 minutes) a week of vigorous-intensity aerobic physical activity, or an equivalent combination of moderate- and vigorous-intensity aerobic activity. Aerobic activity should be performed in episodes of at least 10 minutes, and preferably, it should be spread throughout the week.  Behavioral modification strategies:increasing lean protein intake, decreasing simple carbohydrates, increasing vegetables, and meal planning and cooking strategies  She was informed of the importance of frequent follow-up visits to maximize her success with intensive lifestyle modifications for her multiple health conditions. She was informed we would discuss her lab results at her next visit unless there is a critical issue that needs to be addressed sooner. Greidys agreed to keep her next visit at the agreed upon time to discuss these results.  Labs ordered with plans to discuss at the next visit.   Attestation Statements:  Reviewed by clinician on day of visit: allergies, medications, problem list, medical history, surgical history, family history, social history, and previous encounter notes. This is the patient's first visit at [facility]. The patient's NEW PATIENT PACKET was  reviewed at length. Included in the packet: current and past health history, medications, allergies, ROS, gynecologic history (women only), surgical history, family history, social history, weight history, weight loss surgery history (for those that have had weight loss surgery), nutritional evaluation, mood and food questionnaire, PHQ9, Epworth questionnaire, sleep habits questionnaire, patient life and health improvement goals questionnaire. These will all be scanned into the patient's chart under media.   During the visit, I independently reviewed the patient's EKG, bioimpedance scale results, and indirect calorimeter results. I used this information to tailor a meal plan for the patient that will help her to lose weight and will improve her obesity-related conditions going forward. I performed a medically necessary appropriate examination and/or evaluation. I discussed the assessment and treatment plan with the patient. The patient was provided an opportunity to ask questions and all were answered. The patient agreed with the plan and demonstrated an understanding of the instructions. Labs were ordered at this visit and will be reviewed at the next visit unless more critical results need to be addressed immediately. Clinical information was updated and documented in the EMR.    Adelita Cho, MD   "

## 2024-12-02 LAB — VITAMIN B12: Vitamin B-12: >2000 pg/mL — ABNORMAL HIGH (ref 232–1245)

## 2024-12-02 LAB — COMPREHENSIVE METABOLIC PANEL WITH GFR
ALT: 24 IU/L (ref 0–32)
AST: 25 IU/L (ref 0–40)
Albumin: 4.5 g/dL (ref 3.8–4.9)
Alkaline Phosphatase: 84 IU/L (ref 49–135)
BUN/Creatinine Ratio: 19 (ref 9–23)
BUN: 26 mg/dL — ABNORMAL HIGH (ref 6–24)
Bilirubin Total: 0.3 mg/dL (ref 0.0–1.2)
CO2: 22 mmol/L (ref 20–29)
Calcium: 10.3 mg/dL — ABNORMAL HIGH (ref 8.7–10.2)
Chloride: 102 mmol/L (ref 96–106)
Creatinine, Ser: 1.34 mg/dL — ABNORMAL HIGH (ref 0.57–1.00)
Globulin, Total: 3.2 g/dL (ref 1.5–4.5)
Glucose: 86 mg/dL (ref 70–99)
Potassium: 4 mmol/L (ref 3.5–5.2)
Sodium: 140 mmol/L (ref 134–144)
Total Protein: 7.7 g/dL (ref 6.0–8.5)
eGFR: 48 mL/min/1.73 — ABNORMAL LOW

## 2024-12-02 LAB — T4, FREE: Free T4: 1.24 ng/dL (ref 0.82–1.77)

## 2024-12-02 LAB — CBC WITH DIFFERENTIAL/PLATELET
Basophils Absolute: 0 x10E3/uL (ref 0.0–0.2)
Basos: 1 %
EOS (ABSOLUTE): 0.2 x10E3/uL (ref 0.0–0.4)
Eos: 3 %
Hematocrit: 39.6 % (ref 34.0–46.6)
Hemoglobin: 12.8 g/dL (ref 11.1–15.9)
Immature Grans (Abs): 0 x10E3/uL (ref 0.0–0.1)
Immature Granulocytes: 0 %
Lymphocytes Absolute: 2.7 x10E3/uL (ref 0.7–3.1)
Lymphs: 45 %
MCH: 27.6 pg (ref 26.6–33.0)
MCHC: 32.3 g/dL (ref 31.5–35.7)
MCV: 86 fL (ref 79–97)
Monocytes Absolute: 0.4 x10E3/uL (ref 0.1–0.9)
Monocytes: 8 %
Neutrophils Absolute: 2.5 x10E3/uL (ref 1.4–7.0)
Neutrophils: 43 %
Platelets: 260 x10E3/uL (ref 150–450)
RBC: 4.63 x10E6/uL (ref 3.77–5.28)
RDW: 13.1 % (ref 11.7–15.4)
WBC: 5.8 x10E3/uL (ref 3.4–10.8)

## 2024-12-02 LAB — LIPID PANEL WITH LDL/HDL RATIO
Cholesterol, Total: 182 mg/dL (ref 100–199)
HDL: 52 mg/dL
LDL Chol Calc (NIH): 108 mg/dL — ABNORMAL HIGH (ref 0–99)
LDL/HDL Ratio: 2.1 ratio (ref 0.0–3.2)
Triglycerides: 125 mg/dL (ref 0–149)
VLDL Cholesterol Cal: 22 mg/dL (ref 5–40)

## 2024-12-02 LAB — TSH: TSH: 0.834 u[IU]/mL (ref 0.450–4.500)

## 2024-12-02 LAB — VITAMIN D 25 HYDROXY (VIT D DEFICIENCY, FRACTURES): Vit D, 25-Hydroxy: 75.2 ng/mL (ref 30.0–100.0)

## 2024-12-02 LAB — FOLATE: Folate: >20 ng/mL

## 2024-12-02 LAB — T3: T3, Total: 124 ng/dL (ref 71–180)

## 2024-12-02 LAB — HEMOGLOBIN A1C
Est. average glucose Bld gHb Est-mCnc: 111 mg/dL
Hgb A1c MFr Bld: 5.5 % (ref 4.8–5.6)

## 2024-12-02 LAB — INSULIN, RANDOM: INSULIN: 28.3 u[IU]/mL — ABNORMAL HIGH (ref 2.6–24.9)

## 2024-12-15 ENCOUNTER — Ambulatory Visit (INDEPENDENT_AMBULATORY_CARE_PROVIDER_SITE_OTHER): Admitting: Family Medicine

## 2024-12-24 ENCOUNTER — Ambulatory Visit (INDEPENDENT_AMBULATORY_CARE_PROVIDER_SITE_OTHER): Admitting: Family Medicine

## 2024-12-29 ENCOUNTER — Encounter: Payer: Self-pay | Admitting: Obstetrics and Gynecology

## 2025-01-02 ENCOUNTER — Encounter: Admitting: Family Medicine
